# Patient Record
Sex: Female | Born: 1945 | Race: Black or African American | Hispanic: No | Marital: Married | Smoking: Never smoker
Health system: Southern US, Community
[De-identification: ages and names within clinical notes are randomized; demographics above are authoritative.]

## PROBLEM LIST (undated history)

## (undated) DIAGNOSIS — K069 Disorder of gingiva and edentulous alveolar ridge, unspecified: Secondary | ICD-10-CM

## (undated) DIAGNOSIS — I10 Essential (primary) hypertension: Secondary | ICD-10-CM

## (undated) DIAGNOSIS — F32A Depression, unspecified: Secondary | ICD-10-CM

## (undated) DIAGNOSIS — E663 Overweight: Secondary | ICD-10-CM

## (undated) DIAGNOSIS — K5900 Constipation, unspecified: Secondary | ICD-10-CM

## (undated) DIAGNOSIS — M25569 Pain in unspecified knee: Secondary | ICD-10-CM

## (undated) DIAGNOSIS — F419 Anxiety disorder, unspecified: Secondary | ICD-10-CM

## (undated) DIAGNOSIS — E785 Hyperlipidemia, unspecified: Secondary | ICD-10-CM

## (undated) DIAGNOSIS — G43909 Migraine, unspecified, not intractable, without status migrainosus: Secondary | ICD-10-CM

## (undated) DIAGNOSIS — Z8739 Personal history of other diseases of the musculoskeletal system and connective tissue: Secondary | ICD-10-CM

## (undated) DIAGNOSIS — M545 Low back pain, unspecified: Secondary | ICD-10-CM

## (undated) DIAGNOSIS — R0789 Other chest pain: Secondary | ICD-10-CM

## (undated) HISTORY — DX: Disorder of gingiva and edentulous alveolar ridge, unspecified: K06.9

## (undated) HISTORY — DX: Essential (primary) hypertension: I10

## (undated) HISTORY — DX: Personal history of other diseases of the musculoskeletal system and connective tissue: Z87.39

## (undated) HISTORY — DX: Depression, unspecified: F32.A

## (undated) HISTORY — PX: BREAST LUMPECTOMY: SHX2

## (undated) HISTORY — DX: Low back pain, unspecified: M54.50

## (undated) HISTORY — PX: TUBAL LIGATION: SHX77

## (undated) HISTORY — DX: Migraine, unspecified, not intractable, without status migrainosus: G43.909

## (undated) HISTORY — DX: Overweight: E66.3

## (undated) HISTORY — DX: Hyperlipidemia, unspecified: E78.5

## (undated) HISTORY — DX: Pain in unspecified knee: M25.569

## (undated) HISTORY — DX: Other chest pain: R07.89

## (undated) HISTORY — DX: Constipation, unspecified: K59.00

## (undated) HISTORY — DX: Anxiety disorder, unspecified: F41.9

---

## 1997-09-13 ENCOUNTER — Ambulatory Visit (HOSPITAL_COMMUNITY): Admission: RE | Admit: 1997-09-13 | Discharge: 1997-09-13 | Payer: Self-pay | Admitting: Internal Medicine

## 1998-06-02 ENCOUNTER — Ambulatory Visit (HOSPITAL_COMMUNITY): Admission: RE | Admit: 1998-06-02 | Discharge: 1998-06-02 | Payer: Self-pay | Admitting: Internal Medicine

## 1998-06-16 ENCOUNTER — Other Ambulatory Visit: Admission: RE | Admit: 1998-06-16 | Discharge: 1998-06-16 | Payer: Self-pay | Admitting: Obstetrics and Gynecology

## 1998-09-15 ENCOUNTER — Other Ambulatory Visit: Admission: RE | Admit: 1998-09-15 | Discharge: 1998-09-15 | Payer: Self-pay | Admitting: Obstetrics and Gynecology

## 1999-07-25 ENCOUNTER — Other Ambulatory Visit: Admission: RE | Admit: 1999-07-25 | Discharge: 1999-07-25 | Payer: Self-pay | Admitting: Obstetrics and Gynecology

## 1999-09-27 ENCOUNTER — Other Ambulatory Visit: Admission: RE | Admit: 1999-09-27 | Discharge: 1999-09-27 | Payer: Self-pay | Admitting: Obstetrics and Gynecology

## 1999-09-27 ENCOUNTER — Encounter (INDEPENDENT_AMBULATORY_CARE_PROVIDER_SITE_OTHER): Payer: Self-pay

## 1999-12-01 ENCOUNTER — Ambulatory Visit (HOSPITAL_COMMUNITY): Admission: RE | Admit: 1999-12-01 | Discharge: 1999-12-01 | Payer: Self-pay | Admitting: Obstetrics and Gynecology

## 1999-12-01 ENCOUNTER — Encounter (INDEPENDENT_AMBULATORY_CARE_PROVIDER_SITE_OTHER): Payer: Self-pay

## 2000-04-16 ENCOUNTER — Other Ambulatory Visit: Admission: RE | Admit: 2000-04-16 | Discharge: 2000-04-16 | Payer: Self-pay | Admitting: Obstetrics and Gynecology

## 2000-07-26 ENCOUNTER — Other Ambulatory Visit: Admission: RE | Admit: 2000-07-26 | Discharge: 2000-07-26 | Payer: Self-pay | Admitting: Obstetrics and Gynecology

## 2002-12-04 ENCOUNTER — Encounter: Payer: Self-pay | Admitting: Internal Medicine

## 2002-12-04 ENCOUNTER — Ambulatory Visit (HOSPITAL_COMMUNITY): Admission: RE | Admit: 2002-12-04 | Discharge: 2002-12-04 | Payer: Self-pay | Admitting: Internal Medicine

## 2003-03-05 ENCOUNTER — Encounter: Payer: Self-pay | Admitting: Cardiology

## 2003-03-05 ENCOUNTER — Ambulatory Visit (HOSPITAL_COMMUNITY): Admission: RE | Admit: 2003-03-05 | Discharge: 2003-03-05 | Payer: Self-pay | Admitting: Neurology

## 2003-03-11 ENCOUNTER — Other Ambulatory Visit: Admission: RE | Admit: 2003-03-11 | Discharge: 2003-03-11 | Payer: Self-pay | Admitting: Obstetrics and Gynecology

## 2003-07-22 ENCOUNTER — Emergency Department (HOSPITAL_COMMUNITY): Admission: EM | Admit: 2003-07-22 | Discharge: 2003-07-23 | Payer: Self-pay | Admitting: Emergency Medicine

## 2003-11-18 ENCOUNTER — Inpatient Hospital Stay (HOSPITAL_COMMUNITY): Admission: EM | Admit: 2003-11-18 | Discharge: 2003-11-22 | Payer: Self-pay | Admitting: *Deleted

## 2005-04-29 ENCOUNTER — Ambulatory Visit: Payer: Self-pay | Admitting: Cardiology

## 2005-04-29 ENCOUNTER — Observation Stay (HOSPITAL_COMMUNITY): Admission: EM | Admit: 2005-04-29 | Discharge: 2005-05-01 | Payer: Self-pay | Admitting: Emergency Medicine

## 2005-10-19 ENCOUNTER — Ambulatory Visit (HOSPITAL_COMMUNITY): Admission: RE | Admit: 2005-10-19 | Discharge: 2005-10-19 | Payer: Self-pay | Admitting: Gastroenterology

## 2008-08-15 ENCOUNTER — Emergency Department (HOSPITAL_COMMUNITY): Admission: EM | Admit: 2008-08-15 | Discharge: 2008-08-15 | Payer: Self-pay | Admitting: Emergency Medicine

## 2009-10-20 ENCOUNTER — Encounter: Admission: RE | Admit: 2009-10-20 | Discharge: 2009-10-20 | Payer: Self-pay | Admitting: Gastroenterology

## 2010-01-29 ENCOUNTER — Emergency Department (HOSPITAL_COMMUNITY): Admission: EM | Admit: 2010-01-29 | Discharge: 2010-01-30 | Payer: Self-pay | Admitting: Emergency Medicine

## 2010-07-11 LAB — CBC
MCHC: 34.4 g/dL (ref 30.0–36.0)
MCV: 86.6 fL (ref 78.0–100.0)
Platelets: 355 10*3/uL (ref 150–400)
RDW: 15.1 % (ref 11.5–15.5)
WBC: 12.7 10*3/uL — ABNORMAL HIGH (ref 4.0–10.5)

## 2010-07-11 LAB — POCT I-STAT, CHEM 8
BUN: 14 mg/dL (ref 6–23)
Chloride: 104 mEq/L (ref 96–112)
Glucose, Bld: 102 mg/dL — ABNORMAL HIGH (ref 70–99)
Potassium: 3.8 mEq/L (ref 3.5–5.1)
Sodium: 136 mEq/L (ref 135–145)

## 2010-07-11 LAB — DIFFERENTIAL
Basophils Absolute: 0 10*3/uL (ref 0.0–0.1)
Basophils Relative: 0 % (ref 0–1)
Eosinophils Relative: 0 % (ref 0–5)
Monocytes Absolute: 0.6 10*3/uL (ref 0.1–1.0)

## 2010-07-11 LAB — RAPID STREP SCREEN (MED CTR MEBANE ONLY): Streptococcus, Group A Screen (Direct): POSITIVE — AB

## 2010-07-11 LAB — URINALYSIS, ROUTINE W REFLEX MICROSCOPIC
Bilirubin Urine: NEGATIVE
Glucose, UA: NEGATIVE mg/dL
Protein, ur: NEGATIVE mg/dL
Specific Gravity, Urine: 1.023 (ref 1.005–1.030)
pH: 7.5 (ref 5.0–8.0)

## 2010-08-18 NOTE — Discharge Summary (Signed)
Sara Obrien, MCSHEA NO.:  [id number]   MEDICAL RECORD NO.:  [id number]          PATIENT TYPE:  INP   LOCATION:  3038                         FACILITY:  [facility]   PHYSICIAN:  Margaretmary Bayley, M.D.    DATE OF BIRTH:  [birth year]   DATE OF ADMISSION:  11/17/2003  DATE OF DISCHARGE:  11/22/2003                                 DISCHARGE SUMMARY   DISCHARGE DIAGNOSES:  1.  Vascular headache exacerbated by acute febrile illness.  2.  Hypovolemia.  3.  Nonspecific gastroenteritis with radiologic evidence suggesting colitis.  4.  Arteriosclerotic cerebrovascular disease.  5.  Hypothyroidism.  6.  Hypokalemia.   REASON FOR ADMISSION:  Sara Obrien is a 65 year old African-American woman who  is admitted with a severe periorbital and parietal headache, fever and  profuse weakness, and nausea, but without any vomiting.  The patient was  brought to the emergency room by her husband where she was evaluated by the  staff and found to have a temperature elevation of 101, and an associated  headache with nausea; however, without the usual visual changes that  accompany her migraines.   PHYSICAL EXAMINATION:  Pertinent physical findings on exam:  VITAL SIGNS:  The patient's temperature is elevated at 100.8 with a pulse  rate of 84, respiratory rate of 20 and unlabored, and blood pressure of  129/64.  HEENT:  Without any acute changes and no conjunctival pallor, and no sinus  tenderness.  NECK:  The neck is supple.  There is no adenopathy or thyromegaly.  CHEST:  The patient's chest is without any splitting or tenderness.  LUNGS:  The lungs are clear.  CARDIAC EXAMINATION:  The patient has a precordium that is adynamic.  S1 and  S2 are normal.  No pathologic murmurs or rubs are noted.  ABDOMEN:  The abdomen is nondistended.  There is a mild-to-moderate  epigastric and right-sided tenderness without any rebound.  Bowel sounds are  present, but diminished.  EXTREMITIES:  No  clubbing, cyanosis or edema.  No calf tenderness.  NEUROLOGIC:  The patient is alert and oriented, and appears anxious and  uncomfortable, but without any hallucinations and no changes in sensorium.  There are no focal sensory, motor or reflex deficits.   LABORATORY DATA:  Pertinent lab and x-ray data:  Chest x-ray revealed some  mild cardiomegaly.  There is no pulmonary or vascular abnormality.  Lungs  are clear.  A noncontrast CT scan was performed and showed no evidence of  intracranial hemorrhage, brain edema and no mass effect.   HOSPITAL COURSE:  The patient subsequently underwent a fluoroscopic guided  lumbar puncture because of her fever and headache.  This procedure revealed  clear cerebrospinal fluid and there was no active cellular pattern to  suggest infection; and, subsequent culture revealed no growth.  However, a  pertinent workup included a urinalysis that revealed a specific gravity of  1.029 and no significant cells; however, she did have some hyaline casts.  Blood cultures times two revealed no growth.  A throat culture was negative  for Strep.  Rheumatoid factor was less  than 20; negative.  ANA was likewise  negative.  Admission CBC indicated an elevated white count of 11,900 and the  repeat was 14,300 with a shift to the left, and a hematocrit of 37 and  35.  Sed rate was normal at 15.  A potassium was low at 2.7 on admission and a  sodium of 134, and glucose was slightly high at 107.  The remainder of her  metabolic panel was normal along with a T4 of 6.5 and a TSH of 0.5.  A  random cortisol level was 11 and her FSH of 55 was consistent with  postmenopausal state.  An aldosterone level was less than 1.6 and an  overnight Decadron suppressed cortisol level was appropriate at 2.8.  The  latter results were consistent with a nonendocrine basis for her hypokalemia  and felt to be most likely related to a state of secondary  hyperaldosteronism related to volume  depletion.   Because of her fever and her abdominal tenderness a CT scan was performed,  which indicated a marked inflammatory process involving the ascending colon  felt to be infectious or an inflammatory colitis.  The patient's normal sed  rate of 15 was felt to be inconsistent with inflammatory bowel disease.  The  appendix was normal as well as loops of small bowel.  She did have some  slightly distended prominent ovarian veins; however, there was no evidence  of thromboses; and, ovaries and uterus were normal along with her bladder.   The patient was empirically treated with parenteral Flagyl therapy for an  infectious colitis.  She received parenteral rehydration and pain management  with the use of Vicodin.  Over the course of her five-day hospital stay the  patient did quite well without any subsequent problems being identified.  It  was felt that she would need further GI evaluation as an outpatient and was  planned to have her referred for GI consultation regarding the need for  colonoscopy.  In addition, she was seen in consultation by the neurology  service for her headaches.  It was felt that the patient did have migraines  with an exacerbation by her acute febrile illness and no further workup was  felt necessary at this time.  She was seen by Dr. Vickey Huger previously for  her arteriosclerotic cerebrovascular disease and it was the plan to have her  follow up with the patient as an outpatient.   The patient was subsequently discharged home on Inderal LA 80 mg per day as  both treatment for systemic hypertension as well as migraine prophylaxis,  Plavix 75 mg per day, Flagyl 500 mg twice a day, and Cipro 500 b.i.d. for  one week.   The patient is scheduled to be seen back in my office in two weeks; and, she  needs to call for an appointment to see Dr. Vickey Huger within four weeks of  her discharge.   DISCHARGE CONDITION:  Discharge condition is significantly improved and  the prognosis is considered to be good.       PC/MEDQ  D:  12/30/2003  T:  12/31/2003  Job:  [id number]

## 2010-08-18 NOTE — Op Note (Signed)
[facility]  Patient:    Sara Obrien, Sara Obrien                      MRN: [id number] Proc. Date: 12/01/99 Adm. Date:  [id number] Attending:  Leonard Schwartz                           Operative Report  PREOPERATIVE DIAGNOSES:       1. Cervical intraepithelial neoplasia.                               2. Inadequate colposcopy, with the                                  transition cell not being completely                                  visualized.  POSTOPERATIVE DIAGNOSES:      1. Cervical intraepithelial neoplasia.                               2. Inadequate colposcopy, with the                                  transition cell not being completely                                  visualized.  PROCEDURE:                    LEAP electrical excision.  SURGEON:                      Janine Limbo, M.D.  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  She understands the indications for her procedure and she accepts the risks of, but not limited to, anesthetic complications, bleeding, infections, and possible damage to the surrounding organs.  FINDINGS:                     Colposcopic changes of white epithelium were                               present.  DESCRIPTION OF PROCEDURE:     The patient was taken to the operating room, where she was placed in the lithotomy position.  The vagina was prepped with acetic acid.  The cervix was injected with a diluted solution of Xylocaine and ______ and saline.  A paracervical block was placed using 10 cc of 1% Xylocaine.   Lugol solution was placed on the cervix, and there was only a very small nonstaining area at the endocervical  canal.  The electrical loop was then used to remove a cone-shaped portion of  the cervix.  Hemostasis was achieved with the Ball apparatus.  The estimated blood loss of the procedure was 5 cc.  The patient tolerated the procedure well.  She was taken to the recovery room in stable condition.  FOLLOW-UP INSTRUCTIONS:       The patient was given an instruction sheet, as prepared by the [facility] [place], for patients who have had a D&C, but then was modified for a LEAP procedure.  She will take ibuprofen as needed for discomfort.  She will return to the office in three weeks for follow-up examination. DD:  12/01/99 TD:  12/01/99 Job: [id number]

## 2010-08-18 NOTE — H&P (Signed)
Sara Obrien, Sara Obrien               ACCOUNT NO.:  [id number]   MEDICAL RECORD NO.:  [id number]          PATIENT TYPE:  OBV   LOCATION:  1826                         FACILITY:  [facility]   PHYSICIAN:  Marrian Salvage. Freida Busman, M.D. [facility] OF BIRTH:  [birth year]   DATE OF ADMISSION:  04/29/2005  DATE OF DISCHARGE:                                HISTORY & PHYSICAL   PRIMARY MEDICAL DOCTOR:  Dorothyann Peng in [place].   CARDIOLOGIST:  None previously.   ADMITTING PHYSICIAN:  Dr. Lewayne Bunting   CHIEF COMPLAINT:  Chest pain.   HISTORY OF PRESENT ILLNESS:  The patient is a 65 year old female with no  known prior heart disease who does have cardiac risk factors for  hypertension and cerebrovascular disease. She had some headaches and  dizziness in 2004 and underwent a head CT that showed some mild disease.   Over the last year the patient has had occasional tingling pains in the  chest and down the left arm associated with left-sided leg cramps and  tingling as well. Initially this was only a few times per month but it has  been progressive so that she now almost has it daily. She has been treating  with a heating pad over the left chest with some help. Usually the symptoms  last for about 30 minutes. The past 2 days, January 26 and 27, she has had  increasing leg pains that were significantly bothering her. However, her  chest discomfort had been about the same as previously.   Then today, on January 28 at about 4 p.m. while working on the computer, she  had sudden onset of severe left-sided chest pain, 7/10 in severity, and  radiating down the left arm. She had mild shortness of breath which she  described as needing to take a deep breath in. She had some mild nausea but  no emesis. She had no diaphoresis with the chest pain but she did notice  some diaphoresis in church at 11 a.m. when otherwise she had no chest pain.  She laid down and had worsening sharp pain in the chest to 8/10 or 9/10. She  put a heating pad on without effect. She told her husband that she needed  medical attention. He brought her to the emergency department at [facility].   In the ED the patient had T-wave inversions anteriorly on her EKG. Her  initial troponin was negative. However, a repeat troponin was 0.15. MB and  myoglobin were negative and stable. Given these findings, she was admitted  to the cardiology service. Of note, while in the emergency department being  admitted she was still having 2/10 to 3/10 chest pain but normal vital  signs.   PAST MEDICAL HISTORY:  1.  Mini strokes.  2.  Hypertension.  3.  Borderline hyperlipidemia.  4.  Endometriosis.  5.  Cervical neoplasia status post LEEP.  6.  Migraine headaches.   ALLERGIES:  No known drug allergies.   CURRENT MEDICATIONS:  1.  Plavix 75 mg daily.  2.  Lexapro 10 mg daily.  3.  Benicar 20 mg daily.  4.  Imitrex as needed.  5.  Tylenol P.M. as needed.   SOCIAL HISTORY:  The patient lives in [place] with her husband, Nadine Counts,  phone number [phone]. She is a retired Comptroller. She has two children who  live locally. She has never smoked, does not use alcohol, and denies any  history of drug use. She does water aerobics, Tai Chi, and some weight  lifting occasionally and remains quite active. She eats a regular diet. She  denies any herbal medication use.   FAMILY HISTORY:  Her mother is alive at age 57 but has had stroke,  hypertension, and diabetes. Father died at age 42 of congestive heart  failure after having an MI at age 46. Her siblings have had diabetes and  hypertension.   REVIEW OF SYSTEMS:  Positive for chest pain, shortness of breath, nausea,  and diaphoresis. Otherwise, negative for fevers, chills, recent headaches,  rash, syncope, orthopnea, edema, palpitations, cough, dysuria, arthralgias,  diarrhea. All other systems were negative in detail.   ADVANCE DIRECTIVES:  The patient has no Living Will. She is Full Code.    PHYSICAL EXAMINATION:  VITAL SIGNS:  Temperature 98.5, pulse 70,  respirations 20, blood pressure initially 134/69 and then repeated at  115/65. Her weight is 180 pounds. She was 97% on 2 L.  GENERAL:  She is in no apparent distress, comfortable, conversant.  HEENT:  She does have upper and lower dentures but her oral mucosa was  normal. She had no thyromegaly. No carotid bruits. JVP was flat.  HEART:  Regular rate and rhythm with normal S1, S2. No S3 or S4. No murmurs  or rubs or gallops.  LUNGS:  Clear to auscultation bilaterally with good air movement.  SKIN:  No rashes.  ABDOMEN:  Soft, nontender, with no hepatomegaly.  MUSCULOSKELETAL:  Her chest wall was nontender and her left shoulder had  normal range of motion without reproducibility of symptoms. Her extremities  showed no edema and were warm with 2+ pulses in the PTs.  NEUROLOGIC:  She was alert and oriented and answered questions  appropriately.   Chest x-ray was reviewed and was negative. EKG showed normal sinus rhythm  with one PAC at a rate of 72 with normal axis, normal intervals. She had a Q  wave in L but otherwise no significant Q waves. Her T waves were inverted  anteriorly and apically. She had T-wave flattening inferiorly. There was  some J-point elevation which appeared to be repolarization in lead V2, V3.  No hypertrophy. There was no comparison EKG available.   LABORATORY:  Hematocrit 42. Sodium 136, potassium 4.1, BUN 17, glucose 100.  Myoglobin was initially 86, repeated at 81. Her MB was 3 and was repeated at  3. Troponin was initially negative but then repeated at 0.15.   IMPRESSION:  A 65 year old female with positive cardiac risk factors and  atypical chest pain who presents with equivocal EKG and a borderline  troponin elevation.   1.  Chest pain. A decent story for unstable angina or acute coronary     syndrome starting at 4 p.m. I am not sure what to make of her leg pains      and her potassium is  normal. Her EKG is equivocal without obvious ST      elevations. Unfortunately, there is no comparison. Her cardiac markers      are largely negative but her mild increase in troponin from 6:40 p.m. to      7:40  p.m. is somewhat concerning. Consequently, she is being admitted      for rule out myocardial infarction and is being placed on telemetry. She      will get serial cardiac markers as well as an EKG in the morning. We      will continue her Plavix and add aspirin. She will stay on ARB. Start      low-dose beta blocker and statin therapy. She should get fasting lipids      in the morning. I have put her on heparin. Given her continued mild      chest pain, she will be on nitroglycerin drip at low dose. If her      troponin and MB continue to climb, then she should be started on      IIb/IIIa and get cardiac catheterization tomorrow. If the troponin is      negative at main laboratory, then I would consider exercise stress      testing with imaging given her abnormal EKG. I will defer to the a.m.      rounder on this final decision.  2.  Headache. No further Imitrex if she does have CAD.  3.  Prophylaxis. Proton pump inhibitor and heparin.  4.  Observation admission for now. If stress or catheterization is negative      she could likely go home tomorrow afternoon.           ______________________________  Marrian Salvage Freida Busman, M.D. [facility]     LAA/MEDQ  D:  04/29/2005  T:  04/30/2005  Job:  [id number]   cc:   Candyce Churn. Allyne Gee, M.D.  Fax: [phone]

## 2010-08-18 NOTE — Discharge Summary (Signed)
NAMESEREEN, SCHAFF               ACCOUNT NO.:  [id number]   MEDICAL RECORD NO.:  [id number]          PATIENT TYPE:  OBV   LOCATION:  5504                         FACILITY:  [facility]   PHYSICIAN:  Salvadore Farber, M.D. LHCDATE OF BIRTH:  12/26/1945   DATE OF ADMISSION:  04/29/2005  DATE OF DISCHARGE:  05/01/2005                                 DISCHARGE SUMMARY   PRIMARY CARE PHYSICIAN:  Robyn N. Allyne Gee, M.D., [place], [place].   PRIMARY CARDIOLOGIST:  Salvadore Farber, M.D. [facility]   PRINCIPAL DIAGNOSIS:  Chest pain.   OTHER DIAGNOSES:  1.  Hypertension.  2.  Hyperlipidemia  3.  History of migraine headaches.  4.  History of mini strokes.  5.  Endometriosis.  6.  Cervical neoplasia, status post LEEP.   ALLERGIES:  No known drug allergies.   PROCEDURE:  Left heart cardiac catheterization.   HISTORY OF PRESENT ILLNESS:  A 65 year old female with no prior history of  CAD, who was in her usual state of health until April 29, 2005, when she  was sitting at her computer and developed sudden onset of left-sided 7/10  chest pain with radiation down the left arm associated with mild shortness  of breath and nausea. The pain was worsened with lying down and prompted her  to come to the [facility] ED. In the emergency room, EKG revealed anterior T-  wave inversions. Her initial troponin was negative, however subsequent  troponin was 0.15 with negative CK-MB. Decision was made to admit her for  further evaluation.   HOSPITAL COURSE:  The patient had no additional chest pain during  hospitalization and underwent left heart cardiac catheterization on January  29th which revealed normal coronary arteries and normal LV function. There  was no evidence of coronary vasospasm. D-dimer was negative. This morning  she has been ambulating without recurrent discomfort and is being discharged  home today in satisfactory condition.   DISCHARGE LABORATORY:  Hemoglobin 11.9, hematocrit  34.1, white blood cell  count 4.7, platelet count 370,000. D-dimer was negative times two. Sodium  140, potassium 4.1, chloride 107, CO2 27, BUN 11, creatinine 0.8, glucose  95, calcium 8.9.  Peak troponin was 0.15 with negative CKs and MBs.  Total  cholesterol was 203, triglycerides 76, HDL 51, LDL 137. TSH 1.613.   DISPOSITION:  The patient is being discharged home today in good condition.   FOLLOWUP PLANS AND APPOINTMENTS:  She is to follow up with her primary care  physician, Dr. Dorothyann Peng, in three to four weeks, and has follow-up with  Dr. Samule Ohm on February 21st at 9 a.m. at the [facility] in  [place].   DISCHARGE MEDICATIONS:  1.  Aspirin 81 mg daily.  2.  Benicar 20 mg daily.  3.  Lexapro 10 mg daily.  4.  Lipitor 10 mg daily.   OUTSTANDING LABORATORY STUDIES:  None.   DURATION OF DISCHARGE ENCOUNTER:  30 minutes including physician time.      Ok Anis, NP      Salvadore Farber, M.D. [facility]  Electronically Signed  CRB/MEDQ  D:  05/01/2005  T:  05/01/2005  Job:  [id number]

## 2010-08-18 NOTE — Cardiovascular Report (Signed)
Sara Obrien, Sara Obrien               ACCOUNT NO.:  [id number]   MEDICAL RECORD NO.:  [id number]          PATIENT TYPE:  OBV   LOCATION:  5504                         FACILITY:  [facility]   PHYSICIAN:  Arturo Morton. Riley Kill, M.D. [facility] OF BIRTH:  1945-05-09   DATE OF PROCEDURE:  04/30/2005  DATE OF DISCHARGE:                              CARDIAC CATHETERIZATION   .   INDICATIONS:  The patient presented with chest pain.  She has an abnormal  EKG.  One cardiac enzyme was abnormal but the remaining have been normal.  Her D-dimer is normal as well.  She was seen by Dr. Samule Ohm who subsequently  recommended cardiac catheterization.  He explained the risks, benefits and  alternatives to her and she was brought to the laboratory for further  evaluation.   PROCEDURE:  1.  Left heart catheterization.  2.  Selective coronary arteriography.  3.  Selective left ventriculography.   DESCRIPTION OF PROCEDURE:  Following informed consent the patient was  brought to the catheterization laboratory in the post absorptive state and  prepped and draped in the usual fashion.  Through an anterior puncture the  right femoral artery was entered and a 6-French sheath was placed.  Views of  the left and right coronaries were obtained in multiple angiographic  projections.  Central aortic and left ventricular pressures were measured  with a pigtail.  Ventriculography was performed in the RAO projection.  I  then carefully reviewed all the angiographic studies.  She was taken to the  holding area in satisfactory clinical condition without complications.   HEMODYNAMIC DATA:  1.  Central aortic pressure 147/80, mean 107.  2  Left ventricular pressure 145/60.  1.  No gradient pullback across aortic valve.   ANGIOGRAPHIC DATA:  1.  Ventriculography was performed in the RAO projection.  Overall systolic      function appears preserved.  There were extra-systoles associated with      injections so was difficult to  calculate ejection fraction.  2.  The right coronary artery is a somewhat tortuous vessel but appears to      be smooth throughout its entire course.  It provides a posterior      descending branch of small to moderate size in the posterolateral      branches.  The inferior branches are small in caliber, but did do not      appear to obtain highly obstructive disease and the proximal portions of      the vessels appears smooth throughout.  3.  The left main coronary is fairly short.  It is free of critical disease.  4.  Left anterior descending artery is a tortuous vessel that wraps the apex      and provides the distal inferior wall with a fairly large caliber      branch.  The LAD has marked tortuosity after the septal perforator, but      the vessel is laid out in multiple views and appears to be free of      obstructive disease.  5.  The circumflex system provides a moderate  size first marginal branch      that bifurcates and appears to be free of critical disease, the      remaining vessel courses posteriorly and provides a somewhat smaller      second major marginal and that also appears free of critical disease.   CONCLUSION:  1.  Somewhat tortuous coronaries but without high-grade obstructive disease      noted.  2.  Preserved overall LV function.   The patient's coronary anatomy appears strikingly unremarkable despite the  abnormal EKG.  She has had somewhat atypical symptoms.  D-dimer has been  normal at less than 0.22 but we will repeat this.  We will repeat her EKG in  the morning.  Whether she could have coronary spasm is another possibility.  I will discuss this with Dr. Samule Ohm.      Arturo Morton. Riley Kill, M.D. [facility]  Electronically Signed     TDS/MEDQ  D:  04/30/2005  T:  05/01/2005  Job:  [id number]

## 2010-08-18 NOTE — Consult Note (Signed)
NAME:  Sara Obrien, Sara Obrien                         ACCOUNT NO.:  [id number]   MEDICAL RECORD NO.:  [id number]                   PATIENT TYPE:  INP   LOCATION:  3038                                 FACILITY:  [facility]   PHYSICIAN:  Pramod P. Pearlean Brownie, MD                 DATE OF BIRTH:  1945-05-17   DATE OF CONSULTATION:  11/18/2003  DATE OF DISCHARGE:                                   CONSULTATION   REASON FOR CONSULTATION:  Headache.   HISTORY OF PRESENT ILLNESS:  Sara Obrien is a 65 year old African American  lady who has been having intermittent headaches since her teenage years.  These headaches have increased in frequency since April.  She describes an  ____________ with bifrontal throbbing headache with nausea, photophobia,  phonophobia with a headache for the last several hours left untreated.  She  had to stop and lie down with the headache.  Headaches have recently been  occurring about twice a week.  She takes Tylenol extra strength which takes  the edge off but does not completely get rid of the headache.  She last had  a headache on Tuesday when she began with the headache and she took some  Tylenol.  Subsequently, a few hours later she started having fever with  chills as well as back pain and leg pain.  Her symptoms persisted and she  came to the emergency room late last night and she was also found to have  some diarrhea.   She had a spinal tap done which was unremarkable.  At present, she has had  fevers with chills, back pain, and leg pain but not complaining of  significant headache.   Her past neurologic history is significant for episode of some transient  left arm numbness with slurred speech last year for which she was seen by  Dr. Porfirio Mylar Dohmeier in the office in November 2004.  MRI scan of the brain  had revealed no significant abnormalities in response with her changes.  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  The patient had no definite other history of  strokes or similar events besides this episode.   PAST MEDICAL HISTORY:  1. Significant for hypertension.  2. Hypothyroidism.  3. Endometriosis.  4. __________.   MEDICATIONS:  Aspirin, Plavix, and extra strength Tylenol p.r.n.   ALLERGIES:  None.   FAMILY HISTORY:  Significant for stroke in mother.   SOCIAL HISTORY:  She is married and lives with her husband.  She works as a  Comptroller.  She quit smoking 30 years ago.  She does not drink alcohol or do  drugs.  She has two eldest children.   REVIEW OF SYMPTOMS:  Significant for recent headaches, fever, back pain, leg  pain as stated earlier.   PHYSICAL EXAMINATION:  GENERAL:  Pleasant, middle-aged who is fairly in  distress with fever and chills.  VITAL SIGNS:  Temperature maximum today was 103.9 and 100.1 at present.  Blood pressure 129/64, respiratory rate 24, heart rate 79.  Saturation 97%  on room air.  HEENT:  Normocephalic and atraumatic.  NECK:  Supple without any stiffness or bruit.  HEENT:  Unremarkable.  CARDIOVASCULAR:  No murmur or gallop.  LUNGS:  Clear to auscultation.  NEUROLOGICAL:  She is awake, alert, and oriented x 3 with normal speech and  language function.  There is no aphasia.  Pupils are equal, round, and  reactive to light and accommodation. Visual acuity is good.  Face is  symmetric bilaterally.  Tongue is midline.  Motor system exam reveals  symmetric upper and lower extremities to tone, reflexes, coordination, and  sensation.  Plantars are downgoing.  Gait was not tested.   LABORATORY DATA:  Data reviewed.  Noncontrast CAT scan of the head is not  remarkable.  Spinal fluid showed normal protein and glucose and negative  Gram-stain.  WBC count is 3, red cell count is 27.  White count is elevated  at 14.3 with increased absolute neutrophil count.  ESR is 15 mm.  Blood  cultures and urine cultures are pending.   IMPRESSION:  This is a  65 year old lady with headache in the setting of  acute febrile illness.  She does have a history of migraine headaches and  may had a recent exacerbation due to the acute febrile illness.  I doubt she  has new primary neurological illness.   PLAN:  Continue symptomatic treatment for headaches with Tylenol as needed.  Evaluation for acute febrile illness as per you.  I do not believe further  diagnostic or neurological testing is indicated at the present time.  We  will follow the patient on consult.                                               Pramod P. Pearlean Brownie, MD    PPS/MEDQ  D:  11/18/2003  T:  11/18/2003  Job:  [id number]

## 2010-08-18 NOTE — H&P (Signed)
[facility]  Patient:    Sara Obrien, Sara Obrien                        MRN: [id number] Adm. Date:  12/01/99 Attending:  Janine Limbo, M.D.                         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  The transition zone could not be adequately seen.  PAST MEDICAL HISTORY:         The patient has a history of hypothyroidism.  MEDICATIONS:                  She is currently taking Synthroid 0.112 mg each day.  She is taking Estratab and Provera for menopausal symptoms.  DRUG ALLERGIES:               None.  SOCIAL HISTORY:               The patient denies cigarette use, alcohol use, and recreational drug use.  REVIEW OF SYSTEMS:            Noncontributory.  FAMILY HISTORY:               Noncontributory.  PHYSICAL EXAMINATION:  VITAL SIGNS:                  Weight is 189 pounds.  HEENT:                        Within normal limits.  CHEST:                        Clear.  HEART:                        Regular rate and rhythm.  BREASTS:                      Without masses.  ABDOMEN:                      Nontender.  EXTREMITIES:                  Within normal limits.  NEUROLOGIC EXAMINATION:       Normal.  PELVIC EXAMINATION:           External genitalia is normal.  The vagina is normal.  Cervix is normal except for colposcopic findings.  The uterus is normal size, shape, and consistency.  Adnexa with no masses.  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  PLAN:                         The patient will have a LEEP procedure performed.  Risks and benefits were reviewed. DD:  11/30/99 TD:  11/30/99 Job: [id number]

## 2010-08-20 ENCOUNTER — Emergency Department (HOSPITAL_COMMUNITY)
Admission: EM | Admit: 2010-08-20 | Discharge: 2010-08-20 | Disposition: A | Discharge status: Home / Self Care | Payer: 59 | Attending: Emergency Medicine | Admitting: Emergency Medicine

## 2010-08-20 DIAGNOSIS — R070 Pain in throat: Secondary | ICD-10-CM | POA: Insufficient documentation

## 2010-08-20 DIAGNOSIS — I10 Essential (primary) hypertension: Secondary | ICD-10-CM | POA: Insufficient documentation

## 2010-08-20 DIAGNOSIS — F3289 Other specified depressive episodes: Secondary | ICD-10-CM | POA: Insufficient documentation

## 2010-08-20 DIAGNOSIS — R509 Fever, unspecified: Secondary | ICD-10-CM | POA: Insufficient documentation

## 2010-08-20 DIAGNOSIS — Z79899 Other long term (current) drug therapy: Secondary | ICD-10-CM | POA: Insufficient documentation

## 2010-08-20 DIAGNOSIS — IMO0001 Reserved for inherently not codable concepts without codable children: Secondary | ICD-10-CM | POA: Insufficient documentation

## 2010-08-20 DIAGNOSIS — B9789 Other viral agents as the cause of diseases classified elsewhere: Secondary | ICD-10-CM | POA: Insufficient documentation

## 2010-08-20 DIAGNOSIS — F329 Major depressive disorder, single episode, unspecified: Secondary | ICD-10-CM | POA: Insufficient documentation

## 2010-08-20 LAB — URINALYSIS, ROUTINE W REFLEX MICROSCOPIC
Bilirubin Urine: NEGATIVE
Glucose, UA: NEGATIVE mg/dL
Ketones, ur: NEGATIVE mg/dL
Protein, ur: NEGATIVE mg/dL
pH: 8 (ref 5.0–8.0)

## 2010-08-20 LAB — RAPID STREP SCREEN (MED CTR MEBANE ONLY): Streptococcus, Group A Screen (Direct): NEGATIVE

## 2010-09-26 ENCOUNTER — Ambulatory Visit (INDEPENDENT_AMBULATORY_CARE_PROVIDER_SITE_OTHER): Payer: 59

## 2010-09-26 ENCOUNTER — Inpatient Hospital Stay (INDEPENDENT_AMBULATORY_CARE_PROVIDER_SITE_OTHER)
Admission: RE | Admit: 2010-09-26 | Discharge: 2010-09-26 | Disposition: A | Discharge status: Home / Self Care | Payer: 59 | Source: Ambulatory Visit | Attending: Emergency Medicine | Admitting: Emergency Medicine

## 2010-09-26 DIAGNOSIS — S61209A Unspecified open wound of unspecified finger without damage to nail, initial encounter: Secondary | ICD-10-CM

## 2010-10-10 ENCOUNTER — Other Ambulatory Visit: Payer: Self-pay | Admitting: Radiology

## 2010-10-11 ENCOUNTER — Inpatient Hospital Stay (HOSPITAL_COMMUNITY): Admission: RE | Admit: 2010-10-11 | Payer: 59 | Source: Ambulatory Visit

## 2010-10-11 ENCOUNTER — Inpatient Hospital Stay (HOSPITAL_COMMUNITY)
Admission: RE | Admit: 2010-10-11 | Discharge: 2010-10-11 | Disposition: A | Discharge status: Home / Self Care | Payer: 59 | Source: Ambulatory Visit | Attending: Family Medicine | Admitting: Family Medicine

## 2010-10-24 ENCOUNTER — Encounter (INDEPENDENT_AMBULATORY_CARE_PROVIDER_SITE_OTHER): Payer: Self-pay | Admitting: General Surgery

## 2010-10-24 ENCOUNTER — Ambulatory Visit (INDEPENDENT_AMBULATORY_CARE_PROVIDER_SITE_OTHER): Payer: 59 | Admitting: General Surgery

## 2010-10-24 VITALS — BP 124/88 | HR 64 | Temp 96.7°F | Ht 61.0 in | Wt 175.8 lb

## 2010-10-24 DIAGNOSIS — N6310 Unspecified lump in the right breast, unspecified quadrant: Secondary | ICD-10-CM

## 2010-10-24 DIAGNOSIS — N63 Unspecified lump in unspecified breast: Secondary | ICD-10-CM

## 2010-10-24 NOTE — Patient Instructions (Signed)
We will schedule you for excision of your right breast mass with needle localization in the near future.

## 2010-10-24 NOTE — Progress Notes (Signed)
Subjective:     Patient ID: Sara Obrien, female   DOB: 07-31-1945, 65 y.o.   MRN: [id number]  HPI This is a 65 year old African American female, referred to me by Dr. Jeralyn Ruths for consideration of right breast biopsy. Dr. Gwenette Greet is her regular physician.  The patient has no breast symptoms. No pain no nipple discharge no mass. Recent mammograms suggested a mass on the left side. Followup mammograms and ultrasound showed a small cyst in the left breast at 1:00 position, but there was a solid density in the right breast at the 11:00 position in the retroareolar area. This measured 4 mm in diameter. Image guided right breast biopsy of the retroareolar mass was performed on July 10. The pathology reveals complex sclerosing lesion with ductal hyperplasia and Calcifications. The patient was referred for consideration of complete excision to rule out occult malignancy. The patient would like to have this done.  It is notable that both of her sisters had breast cancer, one died in 09-07-08 and one had a double mastectomy.  Past Medical History  Diagnosis Date  . Hypertension   . Migraines   . Gum disease     dentures worn    Current Outpatient Prescriptions  Medication Sig Dispense Refill  . citalopram (CELEXA) 10 MG tablet Take 10 mg by mouth daily.        [name] olmesartan-hydrochlorothiazide (BENICAR HCT) 40-12.5 MG per tablet Take 1 tablet by mouth daily.        . SUMAtriptan (IMITREX) 25 MG tablet Take 25 mg by mouth as needed.          No Known Allergies  History reviewed. No pertinent family history.  History  Substance Use Topics  . Smoking status: Never Smoker   . Smokeless tobacco: Not on file  . Alcohol Use: Yes     glass of wine      Review of Systems 13 system review of systems is performed and is negative except as described above.    Objective:   Physical Exam  Constitutional: She is oriented to person, place, and time. She appears well-nourished. No  distress.  HENT:  Head: Normocephalic and atraumatic.  Mouth/Throat: No oropharyngeal exudate.  Eyes: Conjunctivae are normal. Pupils are equal, round, and reactive to light. Left eye exhibits no discharge. No scleral icterus.  Neck: Normal range of motion. Neck supple. No JVD present. No tracheal deviation present. No thyromegaly present.  Cardiovascular: Normal rate and regular rhythm.  Exam reveals no gallop and no friction rub.   No murmur heard. Pulmonary/Chest: Breath sounds normal. No respiratory distress. She has no wheezes. She has no rales.    Abdominal: Soft. Bowel sounds are normal. She exhibits no distension and no mass. There is no tenderness. There is no rebound and no guarding.  Musculoskeletal: She exhibits no edema and no tenderness.  Lymphadenopathy:    She has no cervical adenopathy.  Neurological: She is alert and oriented to person, place, and time. She exhibits normal muscle tone.  Skin: Skin is warm and dry. No rash noted. She is not diaphoretic. No erythema. No pallor.  Psychiatric: She has a normal mood and affect. Her behavior is normal. Judgment and thought content normal.       Assessment:     Complex sclerosing lesion with calcifications, right breast, 11:00 retroareolar location.    Plan:     Will schedule for right breast biopsy with needle localization in the near future.  I discussed the  indications and details of surgery with the patient. Risks and complications have been discussed, including but not limited to cosmetic deformity, repeat surgery if malignancy is found, cardiac pulmonary and thromboembolic problems. She  understands these issues well. At this time all of her questions are answered. She is agreement with this plan. We will schedule the near future.

## 2010-11-06 ENCOUNTER — Encounter (HOSPITAL_BASED_OUTPATIENT_CLINIC_OR_DEPARTMENT_OTHER)
Admission: RE | Admit: 2010-11-06 | Discharge: 2010-11-06 | Disposition: A | Discharge status: Home / Self Care | Payer: 59 | Source: Ambulatory Visit | Attending: General Surgery | Admitting: General Surgery

## 2010-11-06 LAB — BASIC METABOLIC PANEL
BUN: 15 mg/dL (ref 6–23)
CO2: 25 mEq/L (ref 19–32)
Creatinine, Ser: 0.69 mg/dL (ref 0.50–1.10)
GFR calc Af Amer: >60 mL/min (ref >60)
GFR calc non Af Amer: >60 mL/min (ref >60)
Glucose, Bld: 88 mg/dL (ref 70–99)
Sodium: 136 mEq/L (ref 135–145)

## 2010-11-06 LAB — URINALYSIS, ROUTINE W REFLEX MICROSCOPIC
Bilirubin Urine: NEGATIVE
Leukocytes, UA: NEGATIVE
pH: 5.5 (ref 5.0–8.0)

## 2010-11-08 ENCOUNTER — Ambulatory Visit (HOSPITAL_BASED_OUTPATIENT_CLINIC_OR_DEPARTMENT_OTHER)
Admission: RE | Admit: 2010-11-08 | Discharge: 2010-11-08 | Disposition: A | Discharge status: Home / Self Care | Payer: 59 | Source: Ambulatory Visit | Attending: General Surgery | Admitting: General Surgery

## 2010-11-08 ENCOUNTER — Other Ambulatory Visit (INDEPENDENT_AMBULATORY_CARE_PROVIDER_SITE_OTHER): Payer: Self-pay | Admitting: General Surgery

## 2010-11-08 DIAGNOSIS — N6019 Diffuse cystic mastopathy of unspecified breast: Secondary | ICD-10-CM

## 2010-11-08 DIAGNOSIS — N63 Unspecified lump in unspecified breast: Secondary | ICD-10-CM | POA: Insufficient documentation

## 2010-11-08 DIAGNOSIS — Z01812 Encounter for preprocedural laboratory examination: Secondary | ICD-10-CM | POA: Insufficient documentation

## 2010-11-08 DIAGNOSIS — Z01810 Encounter for preprocedural cardiovascular examination: Secondary | ICD-10-CM | POA: Insufficient documentation

## 2010-11-08 DIAGNOSIS — Z803 Family history of malignant neoplasm of breast: Secondary | ICD-10-CM | POA: Insufficient documentation

## 2010-11-08 LAB — POCT I-STAT, CHEM 8
BUN: 10 mg/dL (ref 6–23)
Calcium, Ion: 1.19 mmol/L (ref 1.12–1.32)
Chloride: 101 mEq/L (ref 96–112)
Glucose, Bld: 99 mg/dL (ref 70–99)

## 2010-11-09 ENCOUNTER — Telehealth (INDEPENDENT_AMBULATORY_CARE_PROVIDER_SITE_OTHER): Payer: Self-pay

## 2010-11-09 NOTE — Telephone Encounter (Signed)
Unable to reach pt at phone # given. Lmom with appt for 11-20-10 and to call with concerns.

## 2010-11-09 NOTE — Telephone Encounter (Signed)
Pt notified of path result = benign no cancer. Pt has f/u ov.

## 2010-11-14 NOTE — Op Note (Signed)
Sara Obrien, Sara Obrien               ACCOUNT NO.:  [id number]  MEDICAL RECORD NO.:  [id number]  LOCATION:                                 FACILITY:  PHYSICIAN:  Angelia Mould. Derrell Lolling, M.D.DATE OF BIRTH:  1946/01/12  DATE OF PROCEDURE:  11/08/2010 DATE OF DISCHARGE:                              OPERATIVE REPORT   PREOPERATIVE DIAGNOSIS:  Right breast mass, subareolar, complex sclerosing lesion, rule out occult carcinoma.  POSTOPERATIVE DIAGNOSIS:  Right breast mass, subareolar, complex sclerosing lesion, rule out occult carcinoma.  OPERATION PERFORMED:  Right partial mastectomy with needle localization.  SURGEON:  Angelia Mould. Derrell Lolling, MD  OPERATIVE INDICATIONS:  This is a 65 year old African American female who has a strong family history of breast cancer in both of her sisters. She does not have any breast symptoms.  Her  recent mammogram suggested a mass in the right breast at the 11 o'clock position in the retroareolar area, measuring 4 mm in diameter.  Image-guided biopsy this area showed a complex sclerosing lesion with ductal hyperplasia and calcifications.  She was referred for consideration of complete excision to rule out occult malignancy.  I have examined her and counseled her as an outpatient.  I do not really feel any mass or adenopathy but because of the strong family history and the histology of the image-guided biopsy, I felt that it was appropriate to offer excision of this area. She very strongly wanted to have this done.  OPERATIVE TECHNIQUE:  The patient underwent wire localization at [facility] this morning.  The wire entered from about the 4 o'clock position of the right areola and was directed laterally and slightly superiorly toward the area of prior biopsy.  The mammograms that were done after the wire was placed showed that the wire went very near the marker clip and slightly passed it.  The patient was brought to the operating room at [facility].  General anesthesia was induced.  The right breast was prepped and draped in a sterile fashion. Intravenous antibiotics were given.  The patient was identified as correct patient, correct procedure, and correct site.  A 1% Xylocaine with epinephrine was used as a local infiltration anesthetic.  Because the wire entered medially, I chose to make a circumareolar incision at the areolar margin medially.  Dissection was carried down into the breast tissue and then with lots of traction and counter traction, I dissected the nipple and subcutaneous tissue off the breast tissue.  We dissected around the wire, removing most of the subareolar tissue including the wire and its tip intact.  The specimen mammogram showed the entire wire to be intact with the marker clip in reasonably centered in the specimen.  The specimen mammogram was discussed with the radiologist and they felt that everything looked good.  The breast tissue was then sent to Pathology.  It should be noted that I did mark the breast tissue with the 6-color margin marker kit to orient the pathologist.  The wound was irrigated with saline.  Hemostasis was excellent and achieved electrocautery.  The breast tissues were reapproximated with interrupted sutures of 3-0 Vicryl.  The skin was closed with a  running subcuticular suture of 4-0 Monocryl and Dermabond.  A clean bandage and breast binder were placed.  The patient tolerated the procedure well and was taken to recovery room in stable condition.  Estimated blood loss was about 10 mL.  COMPLICATIONS:  None.  Sponge, needle, and instrument counts were correct.     Angelia Mould. Derrell Lolling, M.D.     HMI/MEDQ  D:  11/08/2010  T:  11/09/2010  Job:  [id number]  cc:   Jeralyn Ruths, MD Candyce Churn. Allyne Gee, M.D.  Electronically Signed by Claud Kelp M.D. on 11/14/2010 09:27:37 AM

## 2010-11-20 ENCOUNTER — Ambulatory Visit (INDEPENDENT_AMBULATORY_CARE_PROVIDER_SITE_OTHER): Payer: Medicare Other | Admitting: General Surgery

## 2010-11-20 ENCOUNTER — Encounter (INDEPENDENT_AMBULATORY_CARE_PROVIDER_SITE_OTHER): Payer: Self-pay | Admitting: General Surgery

## 2010-11-20 VITALS — BP 128/82 | HR 66 | Temp 96.5°F

## 2010-11-20 DIAGNOSIS — D249 Benign neoplasm of unspecified breast: Secondary | ICD-10-CM

## 2010-11-20 NOTE — Patient Instructions (Signed)
Your breast biopsy shows benign fibrocystic disease and benign adenomatoid changes. There is no evidence for cancer. Your wounds appear to be healing well. I would avoid contact sports for another 3-4 weeks, otherwise normal activities.  I advise an annual breast exam with your primary care physician. Continu to get annual mammograms. Return to see me if any new problems arise.

## 2010-11-20 NOTE — Progress Notes (Signed)
Subjective:     Patient ID: Sara Obrien, female   DOB: [birth year], 66 y.o.   MRN: [id number]  HPI The 65 year old african American woman underwent a right partial mastectomy in the subareolar area on November 08, 2010. The final pathology report shows benign fibrocystic in adenomatoid changes. No malignancy identified.  She is comfortable with minimal pain seems to be healing well from her point of view. I discussed her pathology report with her and gave her a copy.  Review of Systems     Objective:   Physical Exam Patient looks well. The right periareolar incision is healing very nicely. There's no sign of hematoma or fluid collection or infection. A little bit of  flattening of the areolar area. The skin was healthy.   Assessment:     Right breast mass, subareolar area. Benign fibrocystic and adenomatoid changes by excisional biopsy.  Healing uneventfully in the are postop period.    Plan:     I discussed her pathology report with her and gave her a copy.  She was reassured.  Advise annual breast exam and annual mammography in the future.  Return to see me p.r.n.

## 2010-11-22 ENCOUNTER — Encounter (INDEPENDENT_AMBULATORY_CARE_PROVIDER_SITE_OTHER): Payer: Self-pay | Admitting: General Surgery

## 2012-02-13 ENCOUNTER — Other Ambulatory Visit: Payer: Self-pay | Admitting: Neurology

## 2012-02-13 DIAGNOSIS — R2689 Other abnormalities of gait and mobility: Secondary | ICD-10-CM

## 2012-02-18 ENCOUNTER — Ambulatory Visit
Admission: RE | Admit: 2012-02-18 | Discharge: 2012-02-18 | Disposition: A | Discharge status: Home / Self Care | Payer: Medicare Other | Source: Ambulatory Visit | Attending: Neurology | Admitting: Neurology

## 2012-02-18 DIAGNOSIS — R2689 Other abnormalities of gait and mobility: Secondary | ICD-10-CM

## 2012-03-28 ENCOUNTER — Other Ambulatory Visit: Payer: Self-pay | Admitting: Neurology

## 2012-03-28 DIAGNOSIS — R930 Abnormal findings on diagnostic imaging of skull and head, not elsewhere classified: Secondary | ICD-10-CM

## 2012-03-28 DIAGNOSIS — I6782 Cerebral ischemia: Secondary | ICD-10-CM

## 2012-04-01 ENCOUNTER — Ambulatory Visit
Admission: RE | Admit: 2012-04-01 | Discharge: 2012-04-01 | Disposition: A | Discharge status: Home / Self Care | Payer: Medicare Other | Source: Ambulatory Visit | Attending: Neurology | Admitting: Neurology

## 2012-04-01 DIAGNOSIS — R930 Abnormal findings on diagnostic imaging of skull and head, not elsewhere classified: Secondary | ICD-10-CM

## 2012-04-29 DIAGNOSIS — R413 Other amnesia: Secondary | ICD-10-CM

## 2013-02-08 ENCOUNTER — Encounter (HOSPITAL_COMMUNITY): Payer: Self-pay | Admitting: Emergency Medicine

## 2013-02-08 ENCOUNTER — Emergency Department (HOSPITAL_COMMUNITY)
Admission: EM | Admit: 2013-02-08 | Discharge: 2013-02-08 | Disposition: A | Discharge status: Home / Self Care | Payer: Medicare Other | Attending: Emergency Medicine | Admitting: Emergency Medicine

## 2013-02-08 DIAGNOSIS — S46912A Strain of unspecified muscle, fascia and tendon at shoulder and upper arm level, left arm, initial encounter: Secondary | ICD-10-CM

## 2013-02-08 DIAGNOSIS — Y9389 Activity, other specified: Secondary | ICD-10-CM | POA: Insufficient documentation

## 2013-02-08 DIAGNOSIS — Z8679 Personal history of other diseases of the circulatory system: Secondary | ICD-10-CM | POA: Insufficient documentation

## 2013-02-08 DIAGNOSIS — Y929 Unspecified place or not applicable: Secondary | ICD-10-CM | POA: Insufficient documentation

## 2013-02-08 DIAGNOSIS — I10 Essential (primary) hypertension: Secondary | ICD-10-CM | POA: Insufficient documentation

## 2013-02-08 DIAGNOSIS — Z7982 Long term (current) use of aspirin: Secondary | ICD-10-CM | POA: Insufficient documentation

## 2013-02-08 DIAGNOSIS — X500XXA Overexertion from strenuous movement or load, initial encounter: Secondary | ICD-10-CM | POA: Insufficient documentation

## 2013-02-08 DIAGNOSIS — IMO0002 Reserved for concepts with insufficient information to code with codable children: Secondary | ICD-10-CM | POA: Insufficient documentation

## 2013-02-08 DIAGNOSIS — Z79899 Other long term (current) drug therapy: Secondary | ICD-10-CM | POA: Insufficient documentation

## 2013-02-08 DIAGNOSIS — Z791 Long term (current) use of non-steroidal anti-inflammatories (NSAID): Secondary | ICD-10-CM | POA: Insufficient documentation

## 2013-02-08 MED ORDER — HYDROCODONE-ACETAMINOPHEN 5-325 MG PO TABS: 1.0000 | ORAL_TABLET | ORAL | Status: DC | PRN

## 2013-02-08 NOTE — ED Notes (Signed)
PT reports Arm pain for 2 months after lifting something heavy.

## 2013-02-08 NOTE — ED Provider Notes (Signed)
CSN: [id number]     Arrival date & time 02/08/13  0825 History   First MD Initiated Contact with Patient 02/08/13 0831     No chief complaint on file.  (Consider location/radiation/quality/duration/timing/severity/associated sxs/prior Treatment) HPI Comments: Patient presents to the ER for evaluation of left shoulder and elbow pain. Patient reports that symptoms began 2 months ago while trying to move a heavy king-sized headboard. She tried to lift the headboard and felt pain. She has been using Aleve for the inflammation but has not been helping much. Patient reports increased pain when she lifts her arm. She does not notice any decreased range of motion, however. Patient reports she thought the pain would get better, but it has not. In addition to the pain in the shoulder, she feels pain down in the medial aspect of the left elbow that worsens with movement as well. All pain is mild to moderate, severe with movements.   Past Medical History  Diagnosis Date  . Hypertension   . Migraines   . Gum disease     dentures worn   Past Surgical History  Procedure Laterality Date  . Tubal ligation     No family history on file. History  Substance Use Topics  . Smoking status: Never Smoker   . Smokeless tobacco: Not on file  . Alcohol Use: Yes     Comment: glass of wine   OB History   Grav Para Term Preterm Abortions TAB SAB Ect Mult Living                 Review of Systems  Musculoskeletal: Positive for arthralgias (left shoulder, left elbow) and myalgias (Left upper arm and shoulder).  All other systems reviewed and are negative.    Allergies  Review of patient's allergies indicates no known allergies.  Home Medications   Current Outpatient Rx  Name  Route  Sig  Dispense  Refill  . aspirin EC 81 MG tablet   Oral   Take 81 mg by mouth daily.         . naproxen sodium (ANAPROX) 220 MG tablet   Oral   Take 220 mg by mouth 2 (two) times daily with a meal.         .  olmesartan-hydrochlorothiazide (BENICAR HCT) 40-12.5 MG per tablet   Oral   Take 1 tablet by mouth daily.           . SUMAtriptan (IMITREX) 25 MG tablet   Oral   Take 25 mg by mouth as needed for migraine.           BP 143/71  Pulse 62  Temp(Src) 97.7 F (36.5 C) (Oral)  Resp 18  SpO2 100% Physical Exam  Constitutional: She is oriented to person, place, and time. She appears well-developed and well-nourished. No distress.  HENT:  Head: Normocephalic and atraumatic.  Right Ear: Hearing normal.  Left Ear: Hearing normal.  Nose: Nose normal.  Mouth/Throat: Oropharynx is clear and moist and mucous membranes are normal.  Eyes: Conjunctivae and EOM are normal. Pupils are equal, round, and reactive to light.  Neck: Normal range of motion. Neck supple.  Cardiovascular: Regular rhythm, S1 normal and S2 normal.  Exam reveals no gallop and no friction rub.   No murmur heard. Pulmonary/Chest: Effort normal and breath sounds normal. No respiratory distress. She exhibits no tenderness.  Abdominal: Soft. Normal appearance and bowel sounds are normal. There is no hepatosplenomegaly. There is no tenderness. There is no rebound, no  guarding, no tenderness at McBurney's point and negative Murphy's sign. No hernia.  Musculoskeletal: Normal range of motion.       Left shoulder: She exhibits tenderness and pain (Pain with raising arm and extreme abduction). She exhibits normal range of motion and no deformity.       Cervical back: Normal. She exhibits normal range of motion and no tenderness.  Neurological: She is alert and oriented to person, place, and time. She has normal strength. No cranial nerve deficit or sensory deficit. Coordination normal. GCS eye subscore is 4. GCS verbal subscore is 5. GCS motor subscore is 6.  Skin: Skin is warm, dry and intact. No rash noted. No cyanosis.  Psychiatric: She has a normal mood and affect. Her speech is normal and behavior is normal. Thought content normal.     ED Course  Procedures (including critical care time) Labs Review Labs Reviewed - No data to display Imaging Review No results found.  EKG Interpretation   None       MDM  Left shoulder pain, possible rotator cuff injury  The patient presents to the ER with left shoulder pain persisted for 2 months after an injury while lifting a heavy object. She has preserved range of motion, but painful in addition. There are no deformities noted. Neurologic examination is normal, no sign of injury or pathology. I do have concern for structural injury to the rotator cuff, et Karie Soda. The patient will be treated with rest and analgesia, referral to orthopedics.    Gilda Crease, MD 02/08/13 [phone]

## 2014-03-28 ENCOUNTER — Ambulatory Visit (INDEPENDENT_AMBULATORY_CARE_PROVIDER_SITE_OTHER): Payer: Medicare Other | Admitting: Family Medicine

## 2014-03-28 VITALS — BP 122/72 | HR 63 | Temp 98.9°F | Resp 17 | Ht 60.5 in | Wt 170.0 lb

## 2014-03-28 DIAGNOSIS — R21 Rash and other nonspecific skin eruption: Secondary | ICD-10-CM

## 2014-03-28 DIAGNOSIS — L299 Pruritus, unspecified: Secondary | ICD-10-CM

## 2014-03-28 DIAGNOSIS — L308 Other specified dermatitis: Secondary | ICD-10-CM

## 2014-03-28 MED ORDER — CLOBETASOL PROPIONATE 0.05 % EX CREA: 1.0000 "application " | TOPICAL_CREAM | Freq: Two times a day (BID) | CUTANEOUS | Status: DC

## 2014-03-28 MED ORDER — HYDROXYZINE HCL 50 MG PO TABS
50.0000 mg | ORAL_TABLET | Freq: Three times a day (TID) | ORAL | Status: DC | PRN
Start: 2014-03-28 — End: 2014-07-26

## 2014-03-28 MED ORDER — METHYLPREDNISOLONE (PAK) 4 MG PO TABS: ORAL_TABLET | ORAL | Status: DC

## 2014-03-28 NOTE — Progress Notes (Signed)
Chief Complaint:  Chief Complaint  Patient presents with  . Rash    all over body     HPI: Sara Obrien is a 68 y.o. female who is here for rash on her body and started on chest and also on arm, 2 weeks ago, started , has been taking benadryl and also otc cream, she has bathed in otameal soap. Sh states it tingles and then itches. She ahs had no new meds, no new foods, no new travels, no new furniture or old furniture. No new lotiosn or soaps. No SOB, no Wheezing, NO CP currently. No food allergies. Denies diabetes  Past Medical History  Diagnosis Date  . Hypertension   . Migraines   . Gum disease     dentures worn  . Anxiety   . Hyperlipidemia    Past Surgical History  Procedure Laterality Date  . Tubal ligation    . Breast lumpectomy     History   Social History  . Marital Status: Married    Spouse Name: N/A    Number of Children: N/A  . Years of Education: N/A   Social History Main Topics  . Smoking status: Never Smoker   . Smokeless tobacco: None  . Alcohol Use: Yes     Comment: glass of wine  . Drug Use: No  . Sexual Activity: No   Other Topics Concern  . None   Social History Narrative   History reviewed. No pertinent family history. No Known Allergies Prior to Admission medications   Medication Sig Start Date End Date Taking? Authorizing Provider  escitalopram (LEXAPRO) 20 MG tablet Take 20 mg by mouth daily.   Yes Historical Provider, MD  olmesartan-hydrochlorothiazide (BENICAR HCT) 40-12.5 MG per tablet Take 1 tablet by mouth daily.     Yes Historical Provider, MD  SUMAtriptan (IMITREX) 25 MG tablet Take 25 mg by mouth as needed for migraine.    Yes Historical Provider, MD  naproxen sodium (ANAPROX) 220 MG tablet Take 220 mg by mouth 2 (two) times daily with a meal.    Historical Provider, MD     ROS: The patient denies fevers, chills, night sweats, unintentional weight loss, chest pain, palpitations, wheezing, dyspnea on exertion, nausea,  vomiting, abdominal pain, dysuria, hematuria, melena, numbness, weakness,   All other systems have been reviewed and were otherwise negative with the exception of those mentioned in the HPI and as above.    PHYSICAL EXAM: Filed Vitals:   03/28/14 1040  BP: 122/72  Pulse: 63  Temp: 98.9 F (37.2 C)  Resp: 17   Filed Vitals:   03/28/14 1040  Height: 5' 0.5" (1.537 m)  Weight: 170 lb (77.111 kg)   Body mass index is 32.64 kg/(m^2).  General: Alert, no acute distress HEENT:  Normocephalic, atraumatic, oropharynx patent. EOMI, PERRLA Cardiovascular:  Regular rate and rhythm, no rubs murmurs or gallops.  No Carotid bruits, radial pulse intact. No pedal edema.  Respiratory: Clear to auscultation bilaterally.  No wheezes, rales, or rhonchi.  No cyanosis, no use of accessory musculature GI: No organomegaly, abdomen is soft and non-tender, positive bowel sounds.  No masses. Skin: + contact dermatitis like/ eczeamtous rashes. Neurologic: Facial musculature symmetric. Psychiatric: Patient is appropriate throughout our interaction. Lymphatic: No cervical lymphadenopathy Musculoskeletal: Gait intact.   LABS: Results for orders placed or performed during the hospital encounter of 11/08/10  I-STAT, chem 8  Result Value Ref Range   Sodium 139 135 - 145 mEq/L  Potassium 3.2 (L) 3.5 - 5.1 mEq/L   Chloride 101 96 - 112 mEq/L   BUN 10 6 - 23 mg/dL   Creatinine, Ser 0.90 0.50 - 1.10 mg/dL   Glucose, Bld 99 70 - 99 mg/dL   Calcium, Ion 1.19 1.12 - 1.32 mmol/L   TCO2 27 0 - 100 mmol/L   Hemoglobin 13.9 12.0 - 15.0 g/dL   HCT 41.0 36.0 - 46.0 %     EKG/XRAY:   Primary read interpreted by Dr. Marin Comment at [facility].   ASSESSMENT/PLAN: Encounter Diagnoses  Name Primary?  . Rash and nonspecific skin eruption Yes  . Pruritic dermatitis     Rx Medrol dose pack, Vystaril, and clobetasol cream ( not to put on face) F/u prn   Gross sideeffects, risk and benefits, and alternatives of medications  d/w patient. Patient is aware that all medications have potential sideeffects and we are unable to predict every sideeffect or drug-drug interaction that may occur.  Sherlon Nied, [place], DO 03/30/2014 2:22 PM

## 2014-07-26 ENCOUNTER — Ambulatory Visit (INDEPENDENT_AMBULATORY_CARE_PROVIDER_SITE_OTHER): Payer: Medicare Other | Admitting: Physician Assistant

## 2014-07-26 VITALS — BP 122/76 | HR 65 | Temp 98.4°F | Resp 16 | Ht 60.0 in | Wt 177.6 lb

## 2014-07-26 DIAGNOSIS — L309 Dermatitis, unspecified: Secondary | ICD-10-CM

## 2014-07-26 DIAGNOSIS — B888 Other specified infestations: Secondary | ICD-10-CM | POA: Diagnosis not present

## 2014-07-26 MED ORDER — RANITIDINE HCL 150 MG PO TABS: ORAL_TABLET | ORAL | Status: DC

## 2014-07-26 MED ORDER — CETIRIZINE HCL 10 MG PO TABS: 10.0000 mg | ORAL_TABLET | Freq: Every day | ORAL | Status: DC

## 2014-07-26 NOTE — Patient Instructions (Signed)
Take zantac in the mornings. Take zyrtec in the evening before bed. May apply hydrocortisone to affected area. Return if not getting better in 2 weeks.

## 2014-07-26 NOTE — Progress Notes (Signed)
Subjective:    Patient ID: Sara Obrien, female    DOB: 05-Feb-1946, 69 y.o.   MRN: [id number]  HPI  This is a 69 year old female who is presenting with pruritic rash x 3 days. Reports 4 months ago she had a bed bug infestation that she thinks she got from a hotel in [place]. She got her house fumigated at that time. She was seen here as well and prescribed atarax and triamcinolone cream. Reports atarax worked but steroid cream did not. She reports 3 days ago she woke up itchy over arms, leg and face. She found one bed bug and has saved it to show to exterminator. The exterminators are at her house now fumigating. This fumigation will be free as she was given a 100% guarantee last time. She has contacted her lawyer. She denies fever or chills. She has not tried anything for her symptoms so far.  Review of Systems  Constitutional: Negative for fever and chills.  Gastrointestinal: Negative for nausea and vomiting.  Skin: Positive for rash.  Hematological: Negative for adenopathy.  Psychiatric/Behavioral: Positive for sleep disturbance.    There are no active problems to display for this patient.  Prior to Admission medications   Medication Sig Start Date End Date Taking? Authorizing Provider  escitalopram (LEXAPRO) 20 MG tablet Take 20 mg by mouth daily.   Yes Historical Provider, MD         naproxen sodium (ANAPROX) 220 MG tablet Take 220 mg by mouth 2 (two) times daily with a meal.   Yes Historical Provider, MD  olmesartan-hydrochlorothiazide (BENICAR HCT) 40-12.5 MG per tablet Take 1 tablet by mouth daily.     Yes Historical Provider, MD  SUMAtriptan (IMITREX) 25 MG tablet Take 25 mg by mouth as needed for migraine.    Yes Historical Provider, MD          No Known Allergies  Patient's social and family history were reviewed.     Objective:   Physical Exam  Constitutional: She is oriented to person, place, and time. She appears well-developed and well-nourished. No distress.    HENT:  Head: Normocephalic and atraumatic.  Right Ear: Hearing normal.  Left Ear: Hearing normal.  Nose: Nose normal.  Eyes: Conjunctivae and lids are normal. Right eye exhibits no discharge. Left eye exhibits no discharge. No scleral icterus.  Cardiovascular: Normal rate, regular rhythm and normal pulses.   Pulmonary/Chest: Effort normal and breath sounds normal. No respiratory distress.  Musculoskeletal: Normal range of motion.  Neurological: She is alert and oriented to person, place, and time.  Skin: Skin is warm, dry and intact.  Maculopapular rash on chin, bilateral forearms and right calf.   Psychiatric: She has a normal mood and affect. Her speech is normal and behavior is normal. Thought content normal.   BP 122/76 mmHg  Pulse 65  Temp(Src) 98.4 F (36.9 C) (Oral)  Resp 16  Ht 5' (1.524 m)  Wt 177 lb 9.6 oz (80.559 kg)  BMI 34.69 kg/m2  SpO2 99%     Assessment & Plan:  1. Infestation by bed bug 2. Dermatitis Pt will take zantac QAM and zyrtec QHS. OTC hydrocortisone for topical relief. She is getting her home fumigated today. She will return if not getting better in 7-10 days.  - cetirizine (ZYRTEC) 10 MG tablet; Take 1 tablet (10 mg total) by mouth daily.  Dispense: 30 tablet; Refill: 0 - ranitidine (ZANTAC) 150 MG tablet; Take 1 tab po QAM  Dispense:  30 tablet; Refill: 0   Benjaman Pott. Drenda Freeze, MHS [facility]  07/26/2014

## 2017-06-19 ENCOUNTER — Telehealth: Payer: Self-pay | Admitting: Cardiovascular Disease

## 2017-06-19 NOTE — Telephone Encounter (Signed)
Received incoming records from [facility] for upcoming appointment on 07/01/17 @ 9:40am with Dr. Oval Linsey. Records given to [facility] in Medical Records. 06/19/17 ab

## 2017-06-30 NOTE — Progress Notes (Signed)
Cardiology Office Note   Date:  07/01/2017   ID:  Sara Obrien, Sara Obrien Sep 25, 1945, MRN [id number]  PCP:  [name], MD  Cardiologist:   Skeet Latch, MD   No chief complaint on file.     History of Present Illness: Sara Obrien is a 72 y.o. female with hypertension, hyperlipidemia, and CKD II who is being seen today for the evaluation of chest pain at the request of [name], MD.  Ms. Loveall has been experiencing intermittent episodes of chest pain for 5 months.  She first noticed it November 2018.  It occurs both at rest and with exertion.  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  The episodes are not associated with shortness of breath or nausea.  She does sometimes get diaphoretic.  She also has episodes of left arm weakness, numbness, and tingling.  This is separate from the chest discomfort.  However she first noticed it around the same time.  There are no clear triggers.  The arm discomfort is constantly there but varies in intensity.  At worst is 8 out of 10 in severity.  She does report having neck pain and notes that rubbing her arm or using a hot pack seems to help.  She notes that she is been more stressed than usual lately.  Her adult daughter lives with her.  Her husband also had a motor vehicle accident May 2018.  She has not noted any lower extremity edema, orthopnea, or PND.  Her blood pressure at home is been in the 120s over 70s.  She reports a history of whitecoat hypertension.   Past Medical History:  Diagnosis Date  . Anxiety   . Atypical chest pain 07/01/2017  . Essential hypertension 07/01/2017  . Gum disease    dentures worn  . Hyperlipidemia   . Hypertension   . Migraines     Past Surgical History:  Procedure Laterality Date  . BREAST LUMPECTOMY    . TUBAL LIGATION       Current Outpatient Medications  Medication Sig Dispense Refill  . Cholecalciferol (D3 HIGH POTENCY)  2000 units CAPS Take 1 capsule by mouth daily.    [name] escitalopram (LEXAPRO) 20 MG tablet Take 20 mg by mouth daily.    [name] olmesartan-hydrochlorothiazide (BENICAR HCT) 40-12.5 MG per tablet Take 1 tablet by mouth daily.      . Pitavastatin Calcium 4 MG TABS Take 4 mg by mouth daily.    . SUMAtriptan (IMITREX) 25 MG tablet Take 25 mg by mouth as needed for migraine.      No current facility-administered medications for this visit.     Allergies:   Patient has no known allergies.    Social History:  The patient  reports that she has never smoked. She has never used smokeless tobacco. She reports that she drinks alcohol. She reports that she does not use drugs.   Family History:  The patient's family history includes Brain cancer in her mother; Breast cancer in her sister; Cancer in her brother, brother, and brother; Hypertension in her father, mother, and sister; Stroke in her father.    ROS:  Please see the history of present illness.   Otherwise, review of systems are positive for none.   All other systems are reviewed and negative.    PHYSICAL EXAM: VS:  BP (!) 146/84   Pulse (!) 56   Ht 5' (1.524  m)   Wt 170 lb 6.4 oz (77.3 kg)   BMI 33.28 kg/m  , BMI Body mass index is 33.28 kg/m. GENERAL:  Well appearing.  No acute distress.  HEENT:  Pupils equal round and reactive, fundi not visualized, oral mucosa unremarkable NECK:  No jugular venous distention, waveform within normal limits, carotid upstroke brisk and symmetric, no bruits LUNGS:  Clear to auscultation bilaterally HEART:  RRR.  PMI not displaced or sustained,S1 and S2 within normal limits, no S3, no S4, no clicks, no rubs, no murmurs ABD:  Flat, positive bowel sounds normal in frequency in pitch, no bruits, no rebound, no guarding, no midline pulsatile mass, no hepatomegaly, no splenomegaly EXT:  2 plus pulses throughout, no edema, no cyanosis no clubbing SKIN:  No rashes no nodules NEURO:  Cranial nerves II through XII  grossly intact, motor grossly intact throughout PSYCH:  Cognitively intact, oriented to person place and time   EKG:  EKG is ordered today. The ekg ordered today demonstrates sinus bradycardia.  Rate 56 bpm.  Anterolateral T wave inversions.   Recent Labs: No results found for requested labs within last 8760 hours.   05/09/17: Total cholesterol 162, triglycerides 59, HDL 64, LDL 86 Sodium 143, potassium 4.0, BUN 16, creatinine 0.77 AST 15, ALT 14  Lipid Panel No results found for: CHOL, TRIG, HDL, CHOLHDL, VLDL, LDLCALC, LDLDIRECT    Wt Readings from Last 3 Encounters:  07/01/17 170 lb 6.4 oz (77.3 kg)  07/26/14 177 lb 9.6 oz (80.6 kg)  03/28/14 170 lb (77.1 kg)      ASSESSMENT AND PLAN:  # Chest pain: Symptoms are atypical.  It is hard to tell if she has exertional symptoms given that she does not exert herself.  We will get an exercise Myoview to assess for ischemia.  # Hypertension: Blood pressure is elevated today but has been well-controlled at home.  She has a history of whitecoat hypertension.  Continue all losartan and hydrochlorthiazide.  # Hyperlipidemia: LDL was 86 on 05/2017.  Continue pitavastatin.   # Arm discomfort:  Likely nerve related.  Suggested that she investigate her neck as a source.  # CV Disease Prevention: If her stress testing is negative for ischemia she was encouraged to start exercising at least 150 minutes/week.   Current medicines are reviewed at length with the patient today.  The patient does not have concerns regarding medicines.  The following changes have been made:  no change  Labs/ tests ordered today include:   Orders Placed This Encounter  Procedures  . MYOCARDIAL PERFUSION IMAGING  . EKG 12-Lead     Disposition:   FU with Desmond Szabo C. Oval Linsey, MD, [facility] in 4-6 weeks.     This note was written with the assistance of speech recognition software.  Please excuse any transcriptional errors.  Signed, Avarose Mervine C. Oval Linsey, MD,  [facility]  07/01/2017 11:50 AM    [place]

## 2017-07-01 ENCOUNTER — Ambulatory Visit: Payer: Medicare Other | Admitting: Cardiovascular Disease

## 2017-07-01 ENCOUNTER — Encounter: Payer: Self-pay | Admitting: Cardiovascular Disease

## 2017-07-01 VITALS — BP 146/84 | HR 56 | Ht 60.0 in | Wt 170.4 lb

## 2017-07-01 DIAGNOSIS — E785 Hyperlipidemia, unspecified: Secondary | ICD-10-CM | POA: Insufficient documentation

## 2017-07-01 DIAGNOSIS — R0789 Other chest pain: Secondary | ICD-10-CM | POA: Diagnosis not present

## 2017-07-01 DIAGNOSIS — I10 Essential (primary) hypertension: Secondary | ICD-10-CM | POA: Diagnosis not present

## 2017-07-01 DIAGNOSIS — E7800 Pure hypercholesterolemia, unspecified: Secondary | ICD-10-CM | POA: Diagnosis not present

## 2017-07-01 DIAGNOSIS — R079 Chest pain, unspecified: Secondary | ICD-10-CM | POA: Diagnosis not present

## 2017-07-01 HISTORY — DX: Other chest pain: R07.89

## 2017-07-01 HISTORY — DX: Essential (primary) hypertension: I10

## 2017-07-01 NOTE — Patient Instructions (Addendum)
Medication Instructions:  Your physician recommends that you continue on your current medications as directed. Please refer to the Current Medication list given to you today.  Labwork: NONE  Testing/Procedures: Your physician has requested that you have en exercise stress myoview. For further information please visit [email or web address]. Please follow instruction sheet, as given.  Follow-Up: Your physician recommends that you schedule a follow-up appointment in: ABOUT 4-6 WEEKS  Any Other Special Instructions Will Be Listed Below (If Applicable).  TRY TO EXERCISE 150 MINUTES EACH WEEK   If you need a refill on your cardiac medications before your next appointment, please call your pharmacy.  Cardiac Nuclear Scan A cardiac nuclear scan is a test that measures blood flow to the heart when a person is resting and when he or she is exercising. The test looks for problems such as:  Not enough blood reaching a portion of the heart.  The heart muscle not working normally.  You may need this test if:  You have heart disease.  You have had abnormal lab results.  You have had heart surgery or angioplasty.  You have chest pain.  You have shortness of breath.  In this test, a radioactive dye (tracer) is injected into your bloodstream. After the tracer has traveled to your heart, an imaging device is used to measure how much of the tracer is absorbed by or distributed to various areas of your heart. This procedure is usually done at a hospital and takes 2-4 hours. Tell a health care provider about:  Any allergies you have.  All medicines you are taking, including vitamins, herbs, eye drops, creams, and over-the-counter medicines.  Any problems you or family members have had with the use of anesthetic medicines.  Any blood disorders you have.  Any surgeries you have had.  Any medical conditions you have.  Whether you are pregnant or may be pregnant. What are the  risks? Generally, this is a safe procedure. However, problems may occur, including:  Serious chest pain and heart attack. This is only a risk if the stress portion of the test is done.  Rapid heartbeat.  Sensation of warmth in your chest. This usually passes quickly.  What happens before the procedure?  Ask your health care provider about changing or stopping your regular medicines. This is especially important if you are taking diabetes medicines or blood thinners.  Remove your jewelry on the day of the procedure. What happens during the procedure?  An IV tube will be inserted into one of your veins.  Your health care provider will inject a small amount of radioactive tracer through the tube.  You will wait for 20-40 minutes while the tracer travels through your bloodstream.  Your heart activity will be monitored with an electrocardiogram (ECG).  You will lie down on an exam table.  Images of your heart will be taken for about 15-20 minutes.  You may be asked to exercise on a treadmill or stationary bike. While you exercise, your heart's activity will be monitored with an ECG, and your blood pressure will be checked. If you are unable to exercise, you may be given a medicine to increase blood flow to parts of your heart.  When blood flow to your heart has peaked, a tracer will again be injected through the IV tube.  After 20-40 minutes, you will get back on the exam table and have more images taken of your heart.  When the procedure is over, your IV tube will be removed. The  procedure may vary among health care providers and hospitals. Depending on the type of tracer used, scans may need to be repeated 3-4 hours later. What happens after the procedure?  Unless your health care provider tells you otherwise, you may return to your normal schedule, including diet, activities, and medicines.  Unless your health care provider tells you otherwise, you may increase your fluid  intake. This will help flush the contrast dye from your body. Drink enough fluid to keep your urine clear or pale yellow.  It is up to you to get your test results. Ask your health care provider, or the department that is doing the test, when your results will be ready. Summary  A cardiac nuclear scan measures the blood flow to the heart when a person is resting and when he or she is exercising.  You may need this test if you are at risk for heart disease.  Tell your health care provider if you are pregnant.  Unless your health care provider tells you otherwise, increase your fluid intake. This will help flush the contrast dye from your body. Drink enough fluid to keep your urine clear or pale yellow. This information is not intended to replace advice given to you by your health care provider. Make sure you discuss any questions you have with your health care provider. Document Released: 04/13/2004 Document Revised: 03/21/2016 Document Reviewed: 02/25/2013 Elsevier Interactive Patient Education  2017 [employer].

## 2017-07-17 ENCOUNTER — Telehealth (HOSPITAL_COMMUNITY): Payer: Self-pay

## 2017-07-17 NOTE — Telephone Encounter (Signed)
Encounter complete. 

## 2017-07-19 ENCOUNTER — Ambulatory Visit (HOSPITAL_COMMUNITY)
Admission: RE | Admit: 2017-07-19 | Discharge: 2017-07-19 | Disposition: A | Discharge status: Home / Self Care | Payer: Medicare Other | Source: Ambulatory Visit | Attending: Cardiology | Admitting: Cardiology

## 2017-07-19 DIAGNOSIS — R079 Chest pain, unspecified: Secondary | ICD-10-CM | POA: Diagnosis not present

## 2017-07-19 LAB — MYOCARDIAL PERFUSION IMAGING
CHL CUP NUCLEAR SDS: 2
CHL CUP NUCLEAR SRS: 0
CHL CUP RESTING HR STRESS: 53 {beats}/min
CSEPHR: 89 %
CSEPPHR: 134 {beats}/min
Estimated workload: 5.9 METS
Exercise duration (min): 5 min
Exercise duration (sec): 1 s
LV sys vol: 32 mL
LVDIAVOL: 74 mL (ref 46–106)
MPHR: 149 {beats}/min
RPE: 19
SSS: 2
TID: 1.13

## 2017-07-19 MED ORDER — TECHNETIUM TC 99M TETROFOSMIN IV KIT
10.3000 | PACK | Freq: Once | INTRAVENOUS | Status: AC | PRN
  Administered 2017-07-19: 10.3 via INTRAVENOUS
  Filled 2017-07-19: qty 11

## 2017-07-19 MED ORDER — TECHNETIUM TC 99M TETROFOSMIN IV KIT
30.9000 | PACK | Freq: Once | INTRAVENOUS | Status: AC | PRN
Start: 2017-07-19 — End: 2017-07-19
  Administered 2017-07-19: 30.9 via INTRAVENOUS
  Filled 2017-07-19: qty 31

## 2017-09-26 ENCOUNTER — Ambulatory Visit (INDEPENDENT_AMBULATORY_CARE_PROVIDER_SITE_OTHER): Payer: Medicare Other | Admitting: Cardiovascular Disease

## 2017-09-26 ENCOUNTER — Encounter: Payer: Self-pay | Admitting: Cardiovascular Disease

## 2017-09-26 VITALS — BP 120/68 | HR 64 | Ht 61.0 in | Wt 172.0 lb

## 2017-09-26 DIAGNOSIS — I10 Essential (primary) hypertension: Secondary | ICD-10-CM

## 2017-09-26 DIAGNOSIS — E7800 Pure hypercholesterolemia, unspecified: Secondary | ICD-10-CM | POA: Diagnosis not present

## 2017-09-26 NOTE — Patient Instructions (Signed)
Your physician wants you to follow-up in: ONE YEAR with Dr. Pat Kocher will receive a reminder letter in the mail two months in advance. If you don't receive a letter, please call our office to schedule the follow-up appointment.

## 2017-09-26 NOTE — Progress Notes (Signed)
Cardiology Office Note   Date:  09/26/2017   ID:  Trachelle, Low 03-10-1946, MRN [id number]  PCP:  [name], MD  Cardiologist:   Skeet Latch, MD   Chief Complaint  Patient presents with  . Follow-up      History of Present Illness: Sara Obrien is a 72 y.o. female with hypertension, hyperlipidemia, and CKD II here for follow up.  She was initially seen 07/2017 for the evaluation of chest pain.  At the time she had been experiencing intermittent chest pain for the preceding 5 months.  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  She was referred for [facility] 07/19/2017 that revealed LVEF 56% and no ischemia.  Since that time she has been feeling great.  She started playing pickle ball 4 days/week.  She works out up to 2 hours at a time and has no chest pain or shortness of breath.  She has not noted any lower extremity edema, orthopnea, or PND.  Rotations, lightheadedness, or dizziness.  At her last appointment her blood pressure was elevated but this was attributed to whitecoat hypertension.  Her blood pressure remains well having control at home.   Past Medical History:  Diagnosis Date  . Anxiety   . Atypical chest pain 07/01/2017  . Essential hypertension 07/01/2017  . Gum disease    dentures worn  . Hyperlipidemia   . Hypertension   . Migraines     Past Surgical History:  Procedure Laterality Date  . BREAST LUMPECTOMY    . TUBAL LIGATION       Current Outpatient Medications  Medication Sig Dispense Refill  . Cholecalciferol (D3 HIGH POTENCY) 2000 units CAPS Take 1 capsule by mouth daily.    [name] escitalopram (LEXAPRO) 20 MG tablet Take 20 mg by mouth daily.    [name] olmesartan-hydrochlorothiazide (BENICAR HCT) 40-12.5 MG per tablet Take 1 tablet by mouth daily.      . Pitavastatin  Calcium 4 MG TABS Take 4 mg by mouth daily.    . SUMAtriptan (IMITREX) 25 MG tablet Take 25 mg by mouth as needed for migraine.      No current facility-administered medications for this visit.     Allergies:   Patient has no known allergies.    Social History:  The patient  reports that she has never smoked. She has never used smokeless tobacco. She reports that she drinks alcohol. She reports that she does not use drugs.   Family History:  The patient's family history includes Brain cancer in her mother; Breast cancer in her sister; Cancer in her brother, brother, and brother; Hypertension in her father, mother, and sister; Stroke in her father.    ROS:  Please see the history of present illness.   Otherwise, review of systems are positive for none.   All other systems are reviewed and negative.    PHYSICAL EXAM: VS:  BP 120/68   Pulse 64   Ht 5\' 1"  (1.549 m)   Wt 172 lb (78 kg)   BMI 32.50 kg/m  , BMI Body mass index is 32.5 kg/m. GENERAL:  Well appearing HEENT: Pupils equal round and reactive, fundi not visualized, oral mucosa unremarkable NECK:  No jugular venous distention, waveform within normal limits, carotid upstroke brisk and symmetric, no bruits  LUNGS:  Clear to auscultation bilaterally HEART:  RRR.  PMI not displaced or sustained,S1 and S2 within normal limits, no S3, no S4, no clicks, no rubs, no murmurs ABD:  Flat, positive bowel sounds normal in frequency in pitch, no bruits, no rebound, no guarding, no midline pulsatile mass, no hepatomegaly, no splenomegaly EXT:  2 plus pulses throughout, no edema, no cyanosis no clubbing SKIN:  No rashes no nodules NEURO:  Cranial nerves II through XII grossly intact, motor grossly intact throughout PSYCH:  Cognitively intact, oriented to person place and time    EKG:  EKG is not ordered today. The ekg ordered today demonstrates sinus bradycardia.  Rate 56 bpm.  Anterolateral T wave inversions.  Exercise Myoview  07/19/2017:  Clinically negative, electrically negative for ischemia Extensive motion artifact made evaluation for ischemia difficult  Myovue with normal perfusion No ischemia  LVEF 56%  Low risk study.  Recent Labs: No results found for requested labs within last 8760 hours.   05/09/17: Total cholesterol 162, triglycerides 59, HDL 64, LDL 86 Sodium 143, potassium 4.0, BUN 16, creatinine 0.77 AST 15, ALT 14  Lipid Panel No results found for: CHOL, TRIG, HDL, CHOLHDL, VLDL, LDLCALC, LDLDIRECT    Wt Readings from Last 3 Encounters:  09/26/17 172 lb (78 kg)  07/19/17 170 lb (77.1 kg)  07/01/17 170 lb 6.4 oz (77.3 kg)      ASSESSMENT AND PLAN:  # Chest pain: Resolved.  Lexiscan Myoview was negative for ischemia 07/2016.  # Hypertension: Blood pressure is well-controlled on home olmesartan/HCTZ.  # Hyperlipidemia: LDL was 86 on 05/2017.  Continue pitavastatin.   # Arm discomfort: Improving.  Likely nerve related.   # CV Disease Prevention: Sara Obrien was congratulated on her increased exercise.   Current medicines are reviewed at length with the patient today.  The patient does not have concerns regarding medicines.  The following changes have been made:  no change  Labs/ tests ordered today include:   No orders of the defined types were placed in this encounter.    Disposition:   FU with Amelia Burgard C. Oval Linsey, MD, [facility] in 1 year.      Signed, Geno Sydnor C. Oval Linsey, MD, [facility]  09/26/2017 1:04 PM    [facility]

## 2018-03-21 ENCOUNTER — Other Ambulatory Visit: Payer: Self-pay | Admitting: Internal Medicine

## 2018-04-29 ENCOUNTER — Ambulatory Visit (INDEPENDENT_AMBULATORY_CARE_PROVIDER_SITE_OTHER): Payer: Medicare HMO | Admitting: Internal Medicine

## 2018-04-29 ENCOUNTER — Encounter: Payer: Self-pay | Admitting: Internal Medicine

## 2018-04-29 VITALS — BP 142/86 | HR 59 | Temp 99.0°F | Ht 61.8 in | Wt 176.4 lb

## 2018-04-29 DIAGNOSIS — I129 Hypertensive chronic kidney disease with stage 1 through stage 4 chronic kidney disease, or unspecified chronic kidney disease: Secondary | ICD-10-CM

## 2018-04-29 DIAGNOSIS — J0110 Acute frontal sinusitis, unspecified: Secondary | ICD-10-CM | POA: Diagnosis not present

## 2018-04-29 DIAGNOSIS — R509 Fever, unspecified: Secondary | ICD-10-CM | POA: Diagnosis not present

## 2018-04-29 DIAGNOSIS — R6889 Other general symptoms and signs: Secondary | ICD-10-CM

## 2018-04-29 DIAGNOSIS — N182 Chronic kidney disease, stage 2 (mild): Secondary | ICD-10-CM

## 2018-04-29 DIAGNOSIS — J029 Acute pharyngitis, unspecified: Secondary | ICD-10-CM | POA: Diagnosis not present

## 2018-04-29 LAB — POC INFLUENZA A&B (BINAX/QUICKVUE)
INFLUENZA A, POC: NEGATIVE
INFLUENZA B, POC: NEGATIVE

## 2018-04-29 LAB — POCT RAPID STREP A (OFFICE): RAPID STREP A SCREEN: NEGATIVE

## 2018-04-29 MED ORDER — HYDROCODONE-HOMATROPINE 5-1.5 MG/5ML PO SYRP
5.0000 mL | ORAL_SOLUTION | Freq: Four times a day (QID) | ORAL | 0 refills | Status: DC | PRN
Start: 2018-04-29 — End: 2019-06-16

## 2018-04-29 MED ORDER — CEFTRIAXONE SODIUM 500 MG IJ SOLR
500.0000 mg | Freq: Once | INTRAMUSCULAR | Status: AC
  Administered 2018-04-29: 500 mg via INTRAMUSCULAR

## 2018-04-29 MED ORDER — AMOXICILLIN-POT CLAVULANATE 875-125 MG PO TABS: 1.0000 | ORAL_TABLET | Freq: Two times a day (BID) | ORAL | 0 refills | Status: AC

## 2018-04-29 NOTE — Progress Notes (Signed)
Subjective:     Patient ID: Sara Obrien , female    DOB: Nov 13, 1945 , 73 y.o.   MRN: [id number]   Chief Complaint  Patient presents with  . Headache  . Sore Throat    HPI  Headache   This is a new problem. The current episode started in the past 7 days. The problem occurs intermittently. The pain is located in the frontal region. The pain does not radiate. The quality of the pain is described as dull. The pain is at a severity of 4/10. The pain is mild. Associated symptoms include a fever, sinus pressure and a sore throat.  Sore Throat   This is a recurrent problem. The current episode started in the past 7 days. The problem has been gradually worsening. Neither side of throat is experiencing more pain than the other. The pain is at a severity of 4/10. Associated symptoms include headaches.     Past Medical History:  Diagnosis Date  . Anxiety   . Atypical chest pain 07/01/2017  . Essential hypertension 07/01/2017  . Gum disease    dentures worn  . Hyperlipidemia   . Hypertension   . Migraines      Family History  Problem Relation Age of Onset  . Brain cancer Mother   . Hypertension Mother   . Stroke Father   . Hypertension Father   . Hypertension Sister   . Cancer Brother   . Cancer Brother   . Cancer Brother   . Breast cancer Sister      Current Outpatient Medications:  .  acetaminophen (TYLENOL) 500 MG tablet, Take 500 mg by mouth every 6 (six) hours as needed., Disp: , Rfl:  .  Cholecalciferol (D3 HIGH POTENCY) 2000 units CAPS, Take 1 capsule by mouth daily., Disp: , Rfl:  .  Docosahexaenoic Acid (DHA PO), Take by mouth., Disp: , Rfl:  .  escitalopram (LEXAPRO) 20 MG tablet, TAKE 1 TABLET BY MOUTH EVERY DAY, Disp: 90 tablet, Rfl: 0 .  olmesartan-hydrochlorothiazide (BENICAR HCT) 40-12.5 MG per tablet, Take 1 tablet by mouth daily.  , Disp: , Rfl:  .  Omega-3 Fatty Acids (OMEGA 3 500 PO), Take by mouth., Disp: , Rfl:  .  Pitavastatin Calcium 4 MG TABS, Take 4 mg  by mouth daily., Disp: , Rfl:  .  amoxicillin-clavulanate (AUGMENTIN) 875-125 MG tablet, Take 1 tablet by mouth every 12 (twelve) hours for 10 days., Disp: 20 tablet, Rfl: 0 .  HYDROcodone-homatropine (HYDROMET) 5-1.5 MG/5ML syrup, Take 5 mLs by mouth every 6 (six) hours as needed., Disp: 120 mL, Rfl: 0 .  SUMAtriptan (IMITREX) 25 MG tablet, Take 25 mg by mouth as needed for migraine. , Disp: , Rfl:    No Known Allergies   Review of Systems  Constitutional: Positive for chills, fatigue and fever.  HENT: Positive for sinus pressure and sore throat.   Respiratory: Negative.   Gastrointestinal: Negative.   Musculoskeletal: Positive for myalgias.  Neurological: Positive for headaches.  Psychiatric/Behavioral: Negative.      Today's Vitals   04/29/18 1031  BP: (!) 142/86  Pulse: (!) 59  Temp: 99 F (37.2 C)  TempSrc: Oral  Weight: 176 lb 6.4 oz (80 kg)  Height: 5' 1.8" (1.57 m)  PainSc: 6    Body mass index is 32.47 kg/m.   Objective:  Physical Exam Vitals signs and nursing note reviewed.  Constitutional:      Appearance: She is well-developed. She is obese. She is ill-appearing.  HENT:     Head: Normocephalic and atraumatic.     Comments: Frontal sinuses tender to palpation    Right Ear: Tympanic membrane and ear canal normal.     Left Ear: Tympanic membrane and ear canal normal.     Mouth/Throat:     Mouth: Mucous membranes are moist.     Pharynx: Posterior oropharyngeal erythema present.  Eyes:     Extraocular Movements: Extraocular movements intact.     Pupils: Pupils are equal, round, and reactive to light.  Cardiovascular:     Rate and Rhythm: Normal rate and regular rhythm.     Heart sounds: Normal heart sounds.  Pulmonary:     Effort: Pulmonary effort is normal.     Breath sounds: Normal breath sounds.  Neurological:     Mental Status: She is alert.         Assessment And Plan:     1. Flu-like symptoms  Flu A/B test is negative. She is advised to take  tylenol prn.   - acetaminophen (TYLENOL) 500 MG tablet; Take 500 mg by mouth every 6 (six) hours as needed. - POC Influenza A&B(BINAX/QUICKVUE)  2. Pharyngitis, unspecified etiology  Strep test is negative.    - amoxicillin-clavulanate (AUGMENTIN) 875-125 MG tablet; Take 1 tablet by mouth every 12 (twelve) hours for 10 days.  Dispense: 20 tablet; Refill: 0 - POCT rapid strep A  3. Acute non-recurrent frontal sinusitis  She was given rx as listed below. She was also given rx augmentin to take as directed. She is encouraged complete full abx course.   - HYDROcodone-homatropine (HYDROMET) 5-1.5 MG/5ML syrup; Take 5 mLs by mouth every 6 (six) hours as needed.  Dispense: 120 mL; Refill: 0 - cefTRIAXone (ROCEPHIN) injection 500 mg  4. Hypertensive nephropathy  Fair control. Elevation is likely related to acute illness. She will continue with current meds for now. She agrees to f/u next week as previously scheduled.  5. Chronic renal disease, stage II  These supplements were added to her medication list. She is encouraged to stay well hydrated.   - Docosahexaenoic Acid (DHA PO); Take by mouth. - Omega-3 Fatty Acids (OMEGA 3 500 PO); Take by mouth.        [name], MD

## 2018-05-02 ENCOUNTER — Other Ambulatory Visit: Payer: Self-pay

## 2018-05-08 ENCOUNTER — Other Ambulatory Visit: Payer: Self-pay | Admitting: Internal Medicine

## 2018-05-08 ENCOUNTER — Other Ambulatory Visit: Payer: Self-pay

## 2018-05-08 ENCOUNTER — Ambulatory Visit (INDEPENDENT_AMBULATORY_CARE_PROVIDER_SITE_OTHER): Payer: Medicare HMO | Admitting: Internal Medicine

## 2018-05-08 ENCOUNTER — Encounter: Payer: Self-pay | Admitting: Internal Medicine

## 2018-05-08 VITALS — BP 116/72 | HR 60 | Temp 98.4°F | Ht 60.6 in | Wt 173.4 lb

## 2018-05-08 DIAGNOSIS — N182 Chronic kidney disease, stage 2 (mild): Secondary | ICD-10-CM

## 2018-05-08 DIAGNOSIS — E6609 Other obesity due to excess calories: Secondary | ICD-10-CM

## 2018-05-08 DIAGNOSIS — Z6833 Body mass index (BMI) 33.0-33.9, adult: Secondary | ICD-10-CM | POA: Diagnosis not present

## 2018-05-08 DIAGNOSIS — E7800 Pure hypercholesterolemia, unspecified: Secondary | ICD-10-CM | POA: Diagnosis not present

## 2018-05-08 DIAGNOSIS — I129 Hypertensive chronic kidney disease with stage 1 through stage 4 chronic kidney disease, or unspecified chronic kidney disease: Secondary | ICD-10-CM

## 2018-05-08 DIAGNOSIS — R0982 Postnasal drip: Secondary | ICD-10-CM

## 2018-05-08 DIAGNOSIS — Z1211 Encounter for screening for malignant neoplasm of colon: Secondary | ICD-10-CM

## 2018-05-08 MED ORDER — LEVOCETIRIZINE DIHYDROCHLORIDE 5 MG PO TABS: 5.0000 mg | ORAL_TABLET | Freq: Every evening | ORAL | 1 refills | Status: DC

## 2018-05-08 NOTE — Patient Instructions (Signed)
Postnasal Drip  Postnasal drip is the feeling of mucus going down the back of your throat. Mucus is a slimy substance that moistens and cleans your nose and throat, as well as the air pockets in face bones near your forehead and cheeks (sinuses). Small amounts of mucus pass from your nose and sinuses down the back of your throat all the time. This is normal. When you produce too much mucus or the mucus gets too thick, you can feel it.  Some common causes of postnasal drip include:   Having more mucus because of:  ? A cold or the flu.  ? Allergies.  ? Cold air.  ? Certain medicines.   Having more mucus that is thicker because of:  ? A sinus or nasal infection.  ? Dry air.  ? A food allergy.  Follow these instructions at home:  Relieving discomfort     Gargle with a salt-water mixture 3-4 times a day or as needed. To make a salt-water mixture, completely dissolve -1 tsp of salt in 1 cup of warm water.   If the air in your home is dry, use a humidifier to add moisture to the air.   Use a saline spray or container (neti pot) to flush out the nose (nasal irrigation). These methods can help clear away mucus and keep the nasal passages moist.  General instructions   Take over-the-counter and prescription medicines only as told by your health care provider.   Follow instructions from your health care provider about eating or drinking restrictions. You may need to avoid caffeine.   Avoid things that you know you are allergic to (allergens), like dust, mold, pollen, pets, or certain foods.   Drink enough fluid to keep your urine pale yellow.   Keep all follow-up visits as told by your health care provider. This is important.  Contact a health care provider if:   You have a fever.   You have a sore throat.   You have difficulty swallowing.   You have headache.   You have sinus pain.   You have a cough that does not go away.   The mucus from your nose becomes thick and is green or yellow in color.   You have  cold or flu symptoms that last more than 10 days.  Summary   Postnasal drip is the feeling of mucus going down the back of your throat.   If your health care provider approves, use nasal irrigation or a nasal spray 2?4 times a day.   Avoid things that you know you are allergic to (allergens), like dust, mold, pollen, pets, or certain foods.  This information is not intended to replace advice given to you by your health care provider. Make sure you discuss any questions you have with your health care provider.  Document Released: 07/02/2016 Document Revised: 07/02/2016 Document Reviewed: 07/02/2016  Elsevier Interactive Patient Education  2019 [employer]

## 2018-05-08 NOTE — Progress Notes (Signed)
Subjective:     Patient ID: Sara Obrien , female    DOB: 1945/04/10 , 73 y.o.   MRN: [id number]   Chief Complaint  Patient presents with  . Hypertension  . Hyperlipidemia    HPI  Hypertension  This is a chronic problem. The current episode started more than 1 year ago. The problem has been gradually improving since onset. The problem is controlled. Pertinent negatives include no blurred vision, chest pain, palpitations or shortness of breath. Risk factors for coronary artery disease include dyslipidemia, obesity, post-menopausal state and sedentary lifestyle. The current treatment provides moderate improvement. Hypertensive end-organ damage includes kidney disease. Identifiable causes of hypertension include chronic renal disease.  Hyperlipidemia  This is a chronic problem. The current episode started more than 1 year ago. The problem is controlled. Recent lipid tests were reviewed and are high. Exacerbating diseases include chronic renal disease and obesity. Pertinent negatives include no chest pain or shortness of breath.     Past Medical History:  Diagnosis Date  . Anxiety   . Atypical chest pain 07/01/2017  . Essential hypertension 07/01/2017  . Gum disease    dentures worn  . Hyperlipidemia   . Hypertension   . Migraines      Family History  Problem Relation Age of Onset  . Brain cancer Mother   . Hypertension Mother   . Stroke Father   . Hypertension Father   . Hypertension Sister   . Cancer Brother   . Cancer Brother   . Cancer Brother   . Breast cancer Sister      Current Outpatient Medications:  .  acetaminophen (TYLENOL) 500 MG tablet, Take 500 mg by mouth every 6 (six) hours as needed., Disp: , Rfl:  .  amoxicillin-clavulanate (AUGMENTIN) 875-125 MG tablet, Take 1 tablet by mouth every 12 (twelve) hours for 10 days., Disp: 20 tablet, Rfl: 0 .  Cholecalciferol (D3 HIGH POTENCY) 2000 units CAPS, Take 1 capsule by mouth daily., Disp: , Rfl:  .  escitalopram  (LEXAPRO) 20 MG tablet, TAKE 1 TABLET BY MOUTH EVERY DAY, Disp: 90 tablet, Rfl: 0 .  HYDROcodone-homatropine (HYDROMET) 5-1.5 MG/5ML syrup, Take 5 mLs by mouth every 6 (six) hours as needed., Disp: 120 mL, Rfl: 0 .  olmesartan-hydrochlorothiazide (BENICAR HCT) 40-12.5 MG per tablet, Take 1 tablet by mouth daily.  , Disp: , Rfl:  .  Omega-3 Fatty Acids (OMEGA 3 500 PO), Take by mouth., Disp: , Rfl:  .  Pitavastatin Calcium 4 MG TABS, Take 4 mg by mouth daily., Disp: , Rfl:  .  SUMAtriptan (IMITREX) 25 MG tablet, Take 25 mg by mouth as needed for migraine. , Disp: , Rfl:  .  Docosahexaenoic Acid (DHA PO), Take by mouth., Disp: , Rfl:  .  levocetirizine (XYZAL) 5 MG tablet, TAKE 1 TABLET(5 MG) BY MOUTH EVERY EVENING, Disp: 90 tablet, Rfl: 1   No Known Allergies   Review of Systems  Constitutional: Negative.   HENT: Positive for postnasal drip.   Eyes: Negative for blurred vision.  Respiratory: Negative.  Negative for shortness of breath.   Cardiovascular: Negative.  Negative for chest pain and palpitations.  Gastrointestinal: Negative.   Neurological: Negative.   Psychiatric/Behavioral: Negative.      Today's Vitals   05/08/18 0935  BP: 116/72  Pulse: 60  Temp: 98.4 F (36.9 C)  TempSrc: Oral  SpO2: 97%  Weight: 173 lb 6.4 oz (78.7 kg)  Height: 5' 0.6" (1.539 m)   Body mass index  is 33.2 kg/m.   Objective:  Physical Exam Vitals signs and nursing note reviewed.  Constitutional:      Appearance: Normal appearance. She is obese.  HENT:     Head: Normocephalic and atraumatic.  Cardiovascular:     Rate and Rhythm: Normal rate and regular rhythm.     Heart sounds: Normal heart sounds.  Pulmonary:     Effort: Pulmonary effort is normal.     Breath sounds: Normal breath sounds.  Neurological:     General: No focal deficit present.     Mental Status: She is alert.  Psychiatric:        Mood and Affect: Mood normal.        Behavior: Behavior normal.         Assessment And  Plan:     1. Hypertensive nephropathy  Well controlled. She will continue with current meds. She is encouraged to avoid adding salt to her foods.   - CMP14+EGFR  2. Chronic renal disease, stage II  Chronic, she is encouraged to stay well hydrated.   3. Pure hypercholesterolemia  SHE IS ENCOURAGED TO EXERCISE FIVE DAYS WEEKLY FOR AT LEAST 30 MINUTES, AVOID FRIED FOODS, EAT 25-35 GRAMS OF FIBER, AND TO EAT FISH AT LEAST TWICE WEEKLY.  - Lipid panel  4. Postnasal drip  She was given rx levocterizine '5mg'$  to take nighly as needed. She will let me know if her symptoms persist.   5. Class 1 obesity due to excess calories with serious comorbidity and body mass index (BMI) of 33.0 to 33.9 in adult  She is encouraged to initially strive for BMI less than 30 to decrease cardiac risk. She is advised to exercise no less than 150 minutes per week.    6. Screening for colon cancer  Last one documented was in 2007.  I will refer her to GI for CRC screening.   - Ambulatory referral to Gastroenterology        [name], MD

## 2018-05-09 LAB — CMP14+EGFR
ALBUMIN: 4.2 g/dL (ref 3.7–4.7)
ALT: 14 IU/L (ref 0–32)
AST: 20 IU/L (ref 0–40)
Albumin/Globulin Ratio: 1.1 — ABNORMAL LOW (ref 1.2–2.2)
Alkaline Phosphatase: 63 IU/L (ref 39–117)
BILIRUBIN TOTAL: 0.4 mg/dL (ref 0.0–1.2)
BUN / CREAT RATIO: 16 (ref 12–28)
BUN: 13 mg/dL (ref 8–27)
CALCIUM: 10.1 mg/dL (ref 8.7–10.3)
CHLORIDE: 101 mmol/L (ref 96–106)
CO2: 19 mmol/L — AB (ref 20–29)
Creatinine, Ser: 0.83 mg/dL (ref 0.57–1.00)
GFR, EST AFRICAN AMERICAN: 81 mL/min/{1.73_m2} (ref >59)
GFR, EST NON AFRICAN AMERICAN: 71 mL/min/{1.73_m2} (ref >59)
GLUCOSE: 86 mg/dL (ref 65–99)
Globulin, Total: 3.7 g/dL (ref 1.5–4.5)
Potassium: 4.2 mmol/L (ref 3.5–5.2)
Sodium: 142 mmol/L (ref 134–144)
TOTAL PROTEIN: 7.9 g/dL (ref 6.0–8.5)

## 2018-05-09 LAB — LIPID PANEL
Chol/HDL Ratio: 3.3 ratio (ref 0.0–4.4)
Cholesterol, Total: 183 mg/dL (ref 100–199)
HDL: 56 mg/dL (ref >39)
LDL CALC: 107 mg/dL — AB (ref 0–99)
Triglycerides: 98 mg/dL (ref 0–149)
VLDL Cholesterol Cal: 20 mg/dL (ref 5–40)

## 2018-05-14 ENCOUNTER — Telehealth: Payer: Self-pay

## 2018-05-14 NOTE — Telephone Encounter (Signed)
-----   Message from [name], MD sent at 05/10/2018  9:36 AM EST ----- Your liver and kidney fxn are stable. Be sure to stay well hydrated.  Your LDL is 107. We now have some samples of Livalo. pls give her 3 boxes.  Kw-pls start prior auth. Also, have pt check to be sure cost is not due to an insurance deductible. Thanks.

## 2018-05-14 NOTE — Telephone Encounter (Signed)
Left the pt a message to call back for her lab results. 

## 2018-05-16 ENCOUNTER — Other Ambulatory Visit: Payer: Self-pay | Admitting: Internal Medicine

## 2018-05-16 MED ORDER — AZITHROMYCIN 250 MG PO TABS: ORAL_TABLET | ORAL | 0 refills | Status: AC

## 2018-05-28 DIAGNOSIS — R194 Change in bowel habit: Secondary | ICD-10-CM | POA: Diagnosis not present

## 2018-05-28 DIAGNOSIS — R197 Diarrhea, unspecified: Secondary | ICD-10-CM | POA: Diagnosis not present

## 2018-05-28 DIAGNOSIS — R140 Abdominal distension (gaseous): Secondary | ICD-10-CM | POA: Diagnosis not present

## 2018-05-29 ENCOUNTER — Encounter: Payer: Self-pay | Admitting: Internal Medicine

## 2018-05-30 ENCOUNTER — Other Ambulatory Visit: Payer: Self-pay | Admitting: Internal Medicine

## 2018-05-30 ENCOUNTER — Other Ambulatory Visit: Payer: Self-pay

## 2018-05-30 MED ORDER — ATORVASTATIN CALCIUM 20 MG PO TABS: 20.0000 mg | ORAL_TABLET | Freq: Every day | ORAL | 1 refills | Status: DC

## 2018-06-06 ENCOUNTER — Encounter: Payer: Self-pay | Admitting: Internal Medicine

## 2018-07-07 ENCOUNTER — Ambulatory Visit: Payer: Self-pay | Admitting: Internal Medicine

## 2018-07-08 ENCOUNTER — Encounter: Payer: Self-pay | Admitting: Internal Medicine

## 2018-07-20 ENCOUNTER — Other Ambulatory Visit: Payer: Self-pay | Admitting: Internal Medicine

## 2018-08-15 ENCOUNTER — Other Ambulatory Visit: Payer: Self-pay | Admitting: Internal Medicine

## 2018-08-28 ENCOUNTER — Other Ambulatory Visit: Payer: Self-pay | Admitting: Internal Medicine

## 2018-09-02 DIAGNOSIS — Z20828 Contact with and (suspected) exposure to other viral communicable diseases: Secondary | ICD-10-CM | POA: Diagnosis not present

## 2018-10-13 ENCOUNTER — Encounter: Payer: Self-pay | Admitting: Internal Medicine

## 2018-10-20 ENCOUNTER — Other Ambulatory Visit: Payer: Self-pay | Admitting: Nurse Practitioner

## 2018-10-25 ENCOUNTER — Encounter (HOSPITAL_COMMUNITY): Payer: Self-pay

## 2018-10-25 ENCOUNTER — Ambulatory Visit (HOSPITAL_COMMUNITY)
Admission: EM | Admit: 2018-10-25 | Discharge: 2018-10-25 | Disposition: A | Discharge status: Home / Self Care | Payer: Medicare HMO | Attending: Family Medicine | Admitting: Family Medicine

## 2018-10-25 ENCOUNTER — Other Ambulatory Visit: Payer: Self-pay

## 2018-10-25 ENCOUNTER — Ambulatory Visit (INDEPENDENT_AMBULATORY_CARE_PROVIDER_SITE_OTHER): Payer: Medicare HMO

## 2018-10-25 DIAGNOSIS — Y9373 Activity, racquet and hand sports: Secondary | ICD-10-CM

## 2018-10-25 DIAGNOSIS — S43011A Anterior subluxation of right humerus, initial encounter: Secondary | ICD-10-CM | POA: Diagnosis not present

## 2018-10-25 DIAGNOSIS — S4991XA Unspecified injury of right shoulder and upper arm, initial encounter: Secondary | ICD-10-CM

## 2018-10-25 DIAGNOSIS — W19XXXA Unspecified fall, initial encounter: Secondary | ICD-10-CM

## 2018-10-25 DIAGNOSIS — M25511 Pain in right shoulder: Secondary | ICD-10-CM | POA: Diagnosis not present

## 2018-10-25 MED ORDER — CYCLOBENZAPRINE HCL 5 MG PO TABS: 5.0000 mg | ORAL_TABLET | Freq: Every day | ORAL | 0 refills | Status: DC

## 2018-10-25 MED ORDER — TRAMADOL HCL 50 MG PO TABS: 50.0000 mg | ORAL_TABLET | Freq: Four times a day (QID) | ORAL | 0 refills | Status: DC | PRN

## 2018-10-25 MED ORDER — MELOXICAM 15 MG PO TABS: 15.0000 mg | ORAL_TABLET | Freq: Every day | ORAL | 0 refills | Status: DC

## 2018-10-25 NOTE — ED Provider Notes (Signed)
[facility]    CSN: [id number] Arrival date & time: 10/25/18  1034      History   Chief Complaint Chief Complaint  Patient presents with  . Appointment    1050  . Fall    HPI YOLANDER GOODIE is a 73 y.o. female.   HPI MIRIAH MARUYAMA reports sustaining an injury to right shoulder while playing pickle ball 10 days ago. She was attempting to hit the ball and slipped falling directly onto her right shoulder. The pain has gradually worsened. She is unable to lift, flex, or rotate her right arm. She endorses weakness with gripping motion of the right hand. Mild numbness and tingling of the right fingers.She has attempted relief with ice, heat, and ibuprofen without marked improvement of symptoms. No prior fractures involving the right arm or shoulder. Past Medical History:  Diagnosis Date  . Anxiety   . Atypical chest pain 07/01/2017  . Essential hypertension 07/01/2017  . Gum disease    dentures worn  . Hyperlipidemia   . Hypertension   . Migraines     Patient Active Problem List   Diagnosis Date Noted  . Atypical chest pain 07/01/2017  . Essential hypertension 07/01/2017  . Hyperlipidemia 07/01/2017    Past Surgical History:  Procedure Laterality Date  . BREAST LUMPECTOMY    . TUBAL LIGATION      OB History   No obstetric history on file.      Home Medications    Prior to Admission medications   Medication Sig Start Date End Date Taking? Authorizing Provider  acetaminophen (TYLENOL) 500 MG tablet Take 500 mg by mouth every 6 (six) hours as needed.    [provider]  atorvastatin (LIPITOR) 20 MG tablet TAKE 1 TABLET(20 MG) BY MOUTH DAILY 08/28/18   [name], MD  Cholecalciferol (D3 HIGH POTENCY) 2000 units CAPS Take 1 capsule by mouth daily.    [provider]  Docosahexaenoic Acid (DHA PO) Take by mouth.    [provider]  escitalopram (LEXAPRO) 20 MG tablet TAKE 1 TABLET BY MOUTH EVERY DAY 08/19/18   [name],  MD  HYDROcodone-homatropine (HYDROMET) 5-1.5 MG/5ML syrup Take 5 mLs by mouth every 6 (six) hours as needed. 04/29/18   [name], MD  levocetirizine (XYZAL) 5 MG tablet TAKE 1 TABLET(5 MG) BY MOUTH EVERY EVENING 05/08/18   [name], MD  olmesartan-hydrochlorothiazide [facility] HCT) 40-12.5 MG tablet TAKE 1 TABLET BY MOUTH DAILY 10/20/18   [name], MD  Omega-3 Fatty Acids (OMEGA 3 500 PO) Take by mouth.    [provider]  SUMAtriptan (IMITREX) 25 MG tablet Take 25 mg by mouth as needed for migraine.     [provider]    Family History Family History  Problem Relation Age of Onset  . Brain cancer Mother   . Hypertension Mother   . Stroke Father   . Hypertension Father   . Hypertension Sister   . Cancer Brother   . Cancer Brother   . Cancer Brother   . Breast cancer Sister     Social History Social History   Tobacco Use  . Smoking status: Never Smoker  . Smokeless tobacco: Never Used  Substance Use Topics  . Alcohol use: Yes    Comment: glass of wine  . Drug use: No     Allergies   Patient has no known allergies.   Review of Systems Review of Systems Pertinent negatives listed in HPI Physical Exam  Triage Vital Signs ED Triage Vitals  Enc Vitals Group     BP 10/25/18 1053 118/78     Pulse Rate 10/25/18 1053 62     Resp 10/25/18 1053 18     Temp 10/25/18 1053 98.6 F (37 C)     Temp src --      SpO2 10/25/18 1053 98 %     Weight 10/25/18 1050 170 lb (77.1 kg)     Height --      Head Circumference --      Peak Flow --      Pain Score 10/25/18 1050 7     Pain Loc --      Pain Edu? --      Excl. in [place]? --    No data found.  Updated Vital Signs BP 118/78 (BP Location: Right Arm)   Pulse 62   Temp 98.6 F (37 C)   Resp 18   Wt 170 lb (77.1 kg)   SpO2 98%   BMI 32.55 kg/m   Visual Acuity Right Eye Distance:   Left Eye Distance:   Bilateral Distance:    Right Eye Near:   Left Eye Near:    Bilateral Near:      Physical Exam Constitutional:      Appearance: Normal appearance.  HENT:     Head: Normocephalic.  Neck:     Musculoskeletal: Muscular tenderness present.     Comments: Right sided tenderness of trapezius muscle with palpation  Cardiovascular:     Rate and Rhythm: Normal rate and regular rhythm.  Pulmonary:     Effort: Pulmonary effort is normal.  Musculoskeletal:     Right shoulder: She exhibits decreased range of motion, tenderness and decreased strength. She exhibits no swelling and no effusion.  Neurological:     General: No focal deficit present.     Mental Status: She is alert and oriented to person, place, and time.  Psychiatric:        Mood and Affect: Mood normal.    .  UC Treatments / Results  Labs (all labs ordered are listed, but only abnormal results are displayed) Labs Reviewed - No data to display  EKG   Radiology No results found.  Procedures Procedures (including critical care time)  Medications Ordered in UC Medications - No data to display  Initial Impression / Assessment and Plan / UC Course  I have reviewed the triage vital signs and the nursing notes.  Pertinent labs & imaging results that were available during my care of the patient were reviewed by me and considered in my medical decision making (see chart for details).     CERINA LEARY presents with a complaint of worsening right shoulder pain following a fall 10 days ago. Imaging performed, no acute fracture was revealed, however, joint dislocation could not completely be rule out with imaging. Shoulder sling applied to right arm. Short course of pain medication prescribed. Patient advised to Lajean Saver office on 10/27/18. Patient verbalized understanding and agreement with plan.  Final Clinical Impressions(s) / UC Diagnoses   Final diagnoses:  Fall, initial encounter  Injury of right shoulder, initial encounter     Discharge Instructions     I am recommending follow-up with  orthopedic speciality on Monday. Please call Dr. Lajean Saver office at [phone] to request a follow-up appointment from your visit here today. I have attached a copy of your x-ray results.  For pain, I have prescribed Tramadol 50-100 mg every  8 hours as needed for pain. Cyclobenzaprine 5 mg at bedtime for sleep. Meloxicam 15 mg once daily as needed for pain.    ED Prescriptions    Medication Sig Dispense Auth. Provider   traMADol (ULTRAM) 50 MG tablet Take 1 tablet (50 mg total) by mouth every 6 (six) hours as needed. 15 tablet Scot Jun, FNP   cyclobenzaprine (FLEXERIL) 5 MG tablet Take 1 tablet (5 mg total) by mouth at bedtime. 10 tablet Scot Jun, FNP   meloxicam (MOBIC) 15 MG tablet Take 1 tablet (15 mg total) by mouth daily. 20 tablet Scot Jun, FNP     Controlled Substance Prescriptions [place] Controlled Substance Registry consulted? Yes, I have consulted the [place] Controlled Substances Registry for this patient, and feel the risk/benefit ratio today is favorable for proceeding with this prescription for a controlled substance.   Scot Jun, FNP 10/27/18 1047

## 2018-10-25 NOTE — ED Triage Notes (Signed)
Pt states she fell 10 days ago on her right shoulder. Pt states the shoulder is very sore.

## 2018-10-25 NOTE — Discharge Instructions (Addendum)
I am recommending follow-up with orthopedic speciality on Monday. Please call Dr. Lajean Saver office at [phone] to request a follow-up appointment from your visit here today. I have attached a copy of your x-ray results.  For pain, I have prescribed Tramadol 50-100 mg every 8 hours as needed for pain. Cyclobenzaprine 5 mg at bedtime for sleep. Meloxicam 15 mg once daily as needed for pain.

## 2018-10-30 DIAGNOSIS — M25511 Pain in right shoulder: Secondary | ICD-10-CM | POA: Diagnosis not present

## 2018-10-31 DIAGNOSIS — M25511 Pain in right shoulder: Secondary | ICD-10-CM | POA: Diagnosis not present

## 2018-11-04 DIAGNOSIS — M25511 Pain in right shoulder: Secondary | ICD-10-CM | POA: Diagnosis not present

## 2018-11-18 DIAGNOSIS — M6281 Muscle weakness (generalized): Secondary | ICD-10-CM | POA: Diagnosis not present

## 2018-11-18 DIAGNOSIS — S46011D Strain of muscle(s) and tendon(s) of the rotator cuff of right shoulder, subsequent encounter: Secondary | ICD-10-CM | POA: Diagnosis not present

## 2018-11-18 DIAGNOSIS — M25511 Pain in right shoulder: Secondary | ICD-10-CM | POA: Diagnosis not present

## 2018-11-18 DIAGNOSIS — M25611 Stiffness of right shoulder, not elsewhere classified: Secondary | ICD-10-CM | POA: Diagnosis not present

## 2018-11-21 DIAGNOSIS — Z1231 Encounter for screening mammogram for malignant neoplasm of breast: Secondary | ICD-10-CM | POA: Diagnosis not present

## 2018-11-21 LAB — HM MAMMOGRAPHY: HM Mammogram: NORMAL (ref 0–4)

## 2018-11-26 ENCOUNTER — Encounter: Payer: Self-pay | Admitting: Internal Medicine

## 2018-11-26 DIAGNOSIS — M25511 Pain in right shoulder: Secondary | ICD-10-CM | POA: Diagnosis not present

## 2018-11-26 DIAGNOSIS — S46011D Strain of muscle(s) and tendon(s) of the rotator cuff of right shoulder, subsequent encounter: Secondary | ICD-10-CM | POA: Diagnosis not present

## 2018-11-26 DIAGNOSIS — M25611 Stiffness of right shoulder, not elsewhere classified: Secondary | ICD-10-CM | POA: Diagnosis not present

## 2018-11-26 DIAGNOSIS — M6281 Muscle weakness (generalized): Secondary | ICD-10-CM | POA: Diagnosis not present

## 2018-11-27 ENCOUNTER — Other Ambulatory Visit: Payer: Self-pay | Admitting: Internal Medicine

## 2018-11-28 DIAGNOSIS — S46011D Strain of muscle(s) and tendon(s) of the rotator cuff of right shoulder, subsequent encounter: Secondary | ICD-10-CM | POA: Diagnosis not present

## 2018-11-28 DIAGNOSIS — M6281 Muscle weakness (generalized): Secondary | ICD-10-CM | POA: Diagnosis not present

## 2018-11-28 DIAGNOSIS — M25511 Pain in right shoulder: Secondary | ICD-10-CM | POA: Diagnosis not present

## 2018-11-28 DIAGNOSIS — M25611 Stiffness of right shoulder, not elsewhere classified: Secondary | ICD-10-CM | POA: Diagnosis not present

## 2018-12-02 ENCOUNTER — Telehealth: Payer: Self-pay

## 2018-12-02 DIAGNOSIS — S46011D Strain of muscle(s) and tendon(s) of the rotator cuff of right shoulder, subsequent encounter: Secondary | ICD-10-CM | POA: Diagnosis not present

## 2018-12-02 DIAGNOSIS — M6281 Muscle weakness (generalized): Secondary | ICD-10-CM | POA: Diagnosis not present

## 2018-12-02 DIAGNOSIS — M25511 Pain in right shoulder: Secondary | ICD-10-CM | POA: Diagnosis not present

## 2018-12-02 DIAGNOSIS — M25611 Stiffness of right shoulder, not elsewhere classified: Secondary | ICD-10-CM | POA: Diagnosis not present

## 2018-12-02 NOTE — Telephone Encounter (Signed)
Refilled pt refill request for 16 days enough until pt upcoming appt, lvm to make pt aware and to remind her of her upcoming appt

## 2018-12-04 DIAGNOSIS — M25611 Stiffness of right shoulder, not elsewhere classified: Secondary | ICD-10-CM | POA: Diagnosis not present

## 2018-12-04 DIAGNOSIS — S46011D Strain of muscle(s) and tendon(s) of the rotator cuff of right shoulder, subsequent encounter: Secondary | ICD-10-CM | POA: Diagnosis not present

## 2018-12-04 DIAGNOSIS — M25511 Pain in right shoulder: Secondary | ICD-10-CM | POA: Diagnosis not present

## 2018-12-04 DIAGNOSIS — M6281 Muscle weakness (generalized): Secondary | ICD-10-CM | POA: Diagnosis not present

## 2018-12-10 DIAGNOSIS — M6281 Muscle weakness (generalized): Secondary | ICD-10-CM | POA: Diagnosis not present

## 2018-12-10 DIAGNOSIS — M25511 Pain in right shoulder: Secondary | ICD-10-CM | POA: Diagnosis not present

## 2018-12-10 DIAGNOSIS — S46011D Strain of muscle(s) and tendon(s) of the rotator cuff of right shoulder, subsequent encounter: Secondary | ICD-10-CM | POA: Diagnosis not present

## 2018-12-10 DIAGNOSIS — M25611 Stiffness of right shoulder, not elsewhere classified: Secondary | ICD-10-CM | POA: Diagnosis not present

## 2018-12-12 DIAGNOSIS — M6281 Muscle weakness (generalized): Secondary | ICD-10-CM | POA: Diagnosis not present

## 2018-12-12 DIAGNOSIS — S46011D Strain of muscle(s) and tendon(s) of the rotator cuff of right shoulder, subsequent encounter: Secondary | ICD-10-CM | POA: Diagnosis not present

## 2018-12-12 DIAGNOSIS — M25511 Pain in right shoulder: Secondary | ICD-10-CM | POA: Diagnosis not present

## 2018-12-12 DIAGNOSIS — M25611 Stiffness of right shoulder, not elsewhere classified: Secondary | ICD-10-CM | POA: Diagnosis not present

## 2018-12-16 ENCOUNTER — Ambulatory Visit: Payer: Medicare HMO

## 2018-12-16 ENCOUNTER — Ambulatory Visit (INDEPENDENT_AMBULATORY_CARE_PROVIDER_SITE_OTHER): Payer: Medicare HMO

## 2018-12-16 ENCOUNTER — Other Ambulatory Visit: Payer: Self-pay

## 2018-12-16 VITALS — Temp 97.3°F | Ht 61.0 in | Wt 165.0 lb

## 2018-12-16 DIAGNOSIS — M25511 Pain in right shoulder: Secondary | ICD-10-CM | POA: Diagnosis not present

## 2018-12-16 DIAGNOSIS — Z0000 Encounter for general adult medical examination without abnormal findings: Secondary | ICD-10-CM | POA: Diagnosis not present

## 2018-12-16 DIAGNOSIS — M6281 Muscle weakness (generalized): Secondary | ICD-10-CM | POA: Diagnosis not present

## 2018-12-16 DIAGNOSIS — M25611 Stiffness of right shoulder, not elsewhere classified: Secondary | ICD-10-CM | POA: Diagnosis not present

## 2018-12-16 DIAGNOSIS — S46011D Strain of muscle(s) and tendon(s) of the rotator cuff of right shoulder, subsequent encounter: Secondary | ICD-10-CM | POA: Diagnosis not present

## 2018-12-16 NOTE — Progress Notes (Signed)
Subjective:   Sara Obrien is a 73 y.o. female who presents for an Initial Medicare Annual Wellness Visit.  This visit type was conducted due to national recommendations for restrictions regarding the COVID-19 Pandemic (e.g. social distancing). This format is felt to be most appropriate for this patient at this time. All issues noted in this document were discussed and addressed. No physical exam was performed (except for noted visual exam findings with Video Visits). The patient,[name], has given consent to perform this visit via video. Vital signs may be absent or patient reported.   Patient location:  At home   Nurse location:  At home   Review of Systems    n/a  Cardiac Risk Factors include: advanced age (>20men, >62 women);obesity (BMI >30kg/m2)     Objective:    Today's Vitals   12/16/18 1447  Temp: (!) 97.3 F (36.3 C)  Weight: 165 lb (74.8 kg)  Height: 5\' 1"  (1.549 m)   Body mass index is 31.18 kg/m.  Advanced Directives 12/16/2018  Does Patient Have a Medical Advance Directive? No    Current Medications (verified) Outpatient Encounter Medications as of 12/16/2018  Medication Sig  . acetaminophen (TYLENOL) 500 MG tablet Take 500 mg by mouth every 6 (six) hours as needed.  [name] atorvastatin (LIPITOR) 20 MG tablet TAKE 1 TABLET(20 MG) BY MOUTH DAILY  . Cholecalciferol (D3 HIGH POTENCY) 2000 units CAPS Take 1 capsule by mouth daily.  [name] escitalopram (LEXAPRO) 20 MG tablet TAKE 1 TABLET BY MOUTH EVERY DAY  . levocetirizine (XYZAL) 5 MG tablet TAKE 1 TABLET(5 MG) BY MOUTH EVERY EVENING  . meloxicam (MOBIC) 15 MG tablet Take 1 tablet (15 mg total) by mouth daily.  [name] olmesartan-hydrochlorothiazide (BENICAR HCT) 40-12.5 MG tablet TAKE 1 TABLET BY MOUTH DAILY  . Omega-3 Fatty Acids (OMEGA 3 500 PO) Take by mouth.  . cyclobenzaprine (FLEXERIL) 5 MG tablet Take 1 tablet (5 mg total) by mouth at bedtime. (Patient not taking: Reported on 12/16/2018)  .  Docosahexaenoic Acid (DHA PO) Take by mouth.  [name] HYDROcodone-homatropine (HYDROMET) 5-1.5 MG/5ML syrup Take 5 mLs by mouth every 6 (six) hours as needed. (Patient not taking: Reported on 12/16/2018)  . SUMAtriptan (IMITREX) 25 MG tablet Take 25 mg by mouth as needed for migraine.   . traMADol (ULTRAM) 50 MG tablet Take 1 tablet (50 mg total) by mouth every 6 (six) hours as needed. (Patient not taking: Reported on 12/16/2018)   No facility-administered encounter medications on file as of 12/16/2018.     Allergies (verified) Patient has no known allergies.   History: Past Medical History:  Diagnosis Date  . Anxiety   . Atypical chest pain 07/01/2017  . Essential hypertension 07/01/2017  . Gum disease    dentures worn  . Hyperlipidemia   . Hypertension   . Migraines    Past Surgical History:  Procedure Laterality Date  . BREAST LUMPECTOMY    . TUBAL LIGATION     Family History  Problem Relation Age of Onset  . Brain cancer Mother   . Hypertension Mother   . Stroke Father   . Hypertension Father   . Hypertension Sister   . Cancer Brother   . Cancer Brother   . Cancer Brother   . Breast cancer Sister    Social History   Socioeconomic History  . Marital status: Married    Spouse name: Not on file  . Number of children: Not on file  . Years of  education: Not on file  . Highest education level: Not on file  Occupational History  . Occupation: semi- retired  Scientific laboratory technician  . Financial resource strain: Not hard at all  . Food insecurity    Worry: Never true    Inability: Never true  . Transportation needs    Medical: No    Non-medical: No  Tobacco Use  . Smoking status: Never Smoker  . Smokeless tobacco: Never Used  Substance and Sexual Activity  . Alcohol use: Yes    Comment: glass of wine  . Drug use: No  . Sexual activity: Yes    Birth control/protection: Abstinence  Lifestyle  . Physical activity    Days per week: 7 days    Minutes per session: Not on file  .  Stress: Not at all  Relationships  . Social Herbalist on phone: Not on file    Gets together: Not on file    Attends religious service: Not on file    Active member of club or organization: Not on file    Attends meetings of clubs or organizations: Not on file    Relationship status: Not on file  Other Topics Concern  . Not on file  Social History Narrative  . Not on file    Tobacco Counseling Counseling given: Not Answered   Clinical Intake:  Pre-visit preparation completed: Yes  Pain : No/denies pain     Nutritional Status: BMI > 30  Obese Nutritional Risks: None Diabetes: No            Activities of Daily Living In your present state of health, do you have any difficulty performing the following activities: 12/16/2018  Hearing? N  Vision? N  Difficulty concentrating or making decisions? N  Walking or climbing stairs? N  Dressing or bathing? N  Doing errands, shopping? N  Preparing Food and eating ? N  Using the Toilet? N  In the past six months, have you accidently leaked urine? N  Do you have problems with loss of bowel control? N  Managing your Medications? N  Managing your Finances? N  Housekeeping or managing your Housekeeping? N  Some recent data might be hidden     Immunizations and Health Maintenance Immunization History  Administered Date(s) Administered  . Zoster 07/01/2016   Health Maintenance Due  Topic Date Due  . INFLUENZA VACCINE  11/01/2018    Patient Care Team: [name], MD as PCP - General (Internal Medicine)  Indicate any recent Medical Services you may have received from other than [facility] providers in the past year (date may be approximate).     Assessment:   This is a routine wellness examination for [facility].  Hearing/Vision screen  Hearing Screening   125Hz  250Hz  500Hz  1000Hz  2000Hz  3000Hz  4000Hz  6000Hz  8000Hz   Right ear:           Left ear:           Vision Screening Comments: Annual eye exams   Dietary issues and exercise activities discussed: Current Exercise Habits: Structured exercise class(plays pickle ball 7 days a week), Frequency (Times/Week): 7  Goals    . Weight (lb) < 200 lb (90.7 kg)     12/16/2018, wants to weigh 140 pounds      Depression Screen PHQ 2/9 Scores 12/16/2018 05/08/2018 04/29/2018  PHQ - 2 Score 0 3 0  PHQ- 9 Score 0 6 -    Fall Risk Fall Risk  12/16/2018 05/08/2018  Falls  in the past year? 1 0  Comment 1 fall; while pickle ball -  Injury with Fall? 1 -  Comment injured right rotator cuff -  Risk for fall due to : History of fall(s) -  Follow up Falls evaluation completed;Falls prevention discussed -    Is the patient's home free of loose throw rugs in walkways, pet beds, electrical cords, etc?   yes      Grab bars in the bathroom? no      Handrails on the stairs?   yes      Adequate lighting?   yes  Timed Get Up and Go Performed n/a  Cognitive Function:     6CIT Screen 12/16/2018 12/16/2018  What Year? 0 points 0 points  What month? 0 points 0 points  What time? 0 points 0 points  Count back from 20 0 points 0 points  Months in reverse 0 points 0 points  Repeat phrase 0 points 0 points  Total Score 0 0    Screening Tests Health Maintenance  Topic Date Due  . INFLUENZA VACCINE  11/01/2018  . MAMMOGRAM  11/20/2020  . TETANUS/TDAP  09/23/2023  . COLONOSCOPY  04/13/2025  . DEXA SCAN  Completed  . Hepatitis C Screening  Completed  . PNA vac Low Risk Adult  Completed    Qualifies for Shingles Vaccine? yes  Cancer Screenings: Lung: Low Dose CT Chest recommended if Age 22-80 years, 30 pack-year currently smoking OR have quit w/in 15years. Patient does not qualify. Breast: Up to date on Mammogram? Yes   Up to date of Bone Density/Dexa? Yes Colorectal: up to date  Additional Screenings:  Hepatitis C Screening: 07/2012     Plan:    Patient wants to weigh 140 pounds.  I have personally reviewed and noted the following in the  patient's chart:   . Medical and social history . Use of alcohol, tobacco or illicit drugs  . Current medications and supplements . Functional ability and status . Nutritional status . Physical activity . Advanced directives . List of other physicians . Hospitalizations, surgeries, and ER visits in previous 12 months . Vitals . Screenings to include cognitive, depression, and falls . Referrals and appointments  In addition, I have reviewed and discussed with patient certain preventive protocols, quality metrics, and best practice recommendations. A written personalized care plan for preventive services as well as general preventive health recommendations were provided to patient.     Kellie Simmering, LPN   [phone]

## 2018-12-16 NOTE — Patient Instructions (Signed)
Sara Obrien , Thank you for taking time to come for your Medicare Wellness Visit. I appreciate your ongoing commitment to your health goals. Please review the following plan we discussed and let me know if I can assist you in the future.   Screening recommendations/referrals: Colonoscopy: 04/2015 Mammogram: 11/2018 Bone Density: 12/2017 Recommended yearly ophthalmology/optometry visit for glaucoma screening and checkup Recommended yearly dental visit for hygiene and checkup  Vaccinations: Influenza vaccine: 12/2017 Pneumococcal vaccine: 10/2017 Tdap vaccine: 08/2013 Shingles vaccine: discussed    Advanced directives: Advance directive discussed with you today. Even though you declined this today please call our office should you change your mind and we can give you the proper paperwork for you to fill out.   Conditions/risks identified: obesity  Next appointment: 12/17/2018 at 11:00   Preventive Care 73 Years and Older, Female Preventive care refers to lifestyle choices and visits with your health care provider that can promote health and wellness. What does preventive care include?  A yearly physical exam. This is also called an annual well check.  Dental exams once or twice a year.  Routine eye exams. Ask your health care provider how often you should have your eyes checked.  Personal lifestyle choices, including:  Daily care of your teeth and gums.  Regular physical activity.  Eating a healthy diet.  Avoiding tobacco and drug use.  Limiting alcohol use.  Practicing safe sex.  Taking low-dose aspirin every day.  Taking vitamin and mineral supplements as recommended by your health care provider. What happens during an annual well check? The services and screenings done by your health care provider during your annual well check will depend on your age, overall health, lifestyle risk factors, and family history of disease. Counseling  Your health care provider may ask you  questions about your:  Alcohol use.  Tobacco use.  Drug use.  Emotional well-being.  Home and relationship well-being.  Sexual activity.  Eating habits.  History of falls.  Memory and ability to understand (cognition).  Work and work Statistician.  Reproductive health. Screening  You may have the following tests or measurements:  Height, weight, and BMI.  Blood pressure.  Lipid and cholesterol levels. These may be checked every 5 years, or more frequently if you are over 76 years old.  Skin check.  Lung cancer screening. You may have this screening every year starting at age 2 if you have a 30-pack-year history of smoking and currently smoke or have quit within the past 15 years.  Fecal occult blood test (FOBT) of the stool. You may have this test every year starting at age 67.  Flexible sigmoidoscopy or colonoscopy. You may have a sigmoidoscopy every 5 years or a colonoscopy every 10 years starting at age 41.  Hepatitis C blood test.  Hepatitis B blood test.  Sexually transmitted disease (STD) testing.  Diabetes screening. This is done by checking your blood sugar (glucose) after you have not eaten for a while (fasting). You may have this done every 1-3 years.  Bone density scan. This is done to screen for osteoporosis. You may have this done starting at age 1.  Mammogram. This may be done every 1-2 years. Talk to your health care provider about how often you should have regular mammograms. Talk with your health care provider about your test results, treatment options, and if necessary, the need for more tests. Vaccines  Your health care provider may recommend certain vaccines, such as:  Influenza vaccine. This is recommended every  year.  Tetanus, diphtheria, and acellular pertussis (Tdap, Td) vaccine. You may need a Td booster every 10 years.  Zoster vaccine. You may need this after age 42.  Pneumococcal 13-valent conjugate (PCV13) vaccine. One dose is  recommended after age 19.  Pneumococcal polysaccharide (PPSV23) vaccine. One dose is recommended after age 72. Talk to your health care provider about which screenings and vaccines you need and how often you need them. This information is not intended to replace advice given to you by your health care provider. Make sure you discuss any questions you have with your health care provider. Document Released: 04/15/2015 Document Revised: 12/07/2015 Document Reviewed: 01/18/2015 Elsevier Interactive Patient Education  2017 [employer] Prevention in the Home Falls can cause injuries. They can happen to people of all ages. There are many things you can do to make your home safe and to help prevent falls. What can I do on the outside of my home?  Regularly fix the edges of walkways and driveways and fix any cracks.  Remove anything that might make you trip as you walk through a door, such as a raised step or threshold.  Trim any bushes or trees on the path to your home.  Use bright outdoor lighting.  Clear any walking paths of anything that might make someone trip, such as rocks or tools.  Regularly check to see if handrails are loose or broken. Make sure that both sides of any steps have handrails.  Any raised decks and porches should have guardrails on the edges.  Have any leaves, snow, or ice cleared regularly.  Use sand or salt on walking paths during winter.  Clean up any spills in your garage right away. This includes oil or grease spills. What can I do in the bathroom?  Use night lights.  Install grab bars by the toilet and in the tub and shower. Do not use towel bars as grab bars.  Use non-skid mats or decals in the tub or shower.  If you need to sit down in the shower, use a plastic, non-slip stool.  Keep the floor dry. Clean up any water that spills on the floor as soon as it happens.  Remove soap buildup in the tub or shower regularly.  Attach bath mats  securely with double-sided non-slip rug tape.  Do not have throw rugs and other things on the floor that can make you trip. What can I do in the bedroom?  Use night lights.  Make sure that you have a light by your bed that is easy to reach.  Do not use any sheets or blankets that are too big for your bed. They should not hang down onto the floor.  Have a firm chair that has side arms. You can use this for support while you get dressed.  Do not have throw rugs and other things on the floor that can make you trip. What can I do in the kitchen?  Clean up any spills right away.  Avoid walking on wet floors.  Keep items that you use a lot in easy-to-reach places.  If you need to reach something above you, use a strong step stool that has a grab bar.  Keep electrical cords out of the way.  Do not use floor polish or wax that makes floors slippery. If you must use wax, use non-skid floor wax.  Do not have throw rugs and other things on the floor that can make you trip. What can  I do with my stairs?  Do not leave any items on the stairs.  Make sure that there are handrails on both sides of the stairs and use them. Fix handrails that are broken or loose. Make sure that handrails are as long as the stairways.  Check any carpeting to make sure that it is firmly attached to the stairs. Fix any carpet that is loose or worn.  Avoid having throw rugs at the top or bottom of the stairs. If you do have throw rugs, attach them to the floor with carpet tape.  Make sure that you have a light switch at the top of the stairs and the bottom of the stairs. If you do not have them, ask someone to add them for you. What else can I do to help prevent falls?  Wear shoes that:  Do not have high heels.  Have rubber bottoms.  Are comfortable and fit you well.  Are closed at the toe. Do not wear sandals.  If you use a stepladder:  Make sure that it is fully opened. Do not climb a closed  stepladder.  Make sure that both sides of the stepladder are locked into place.  Ask someone to hold it for you, if possible.  Clearly mark and make sure that you can see:  Any grab bars or handrails.  First and last steps.  Where the edge of each step is.  Use tools that help you move around (mobility aids) if they are needed. These include:  Canes.  Walkers.  Scooters.  Crutches.  Turn on the lights when you go into a dark area. Replace any light bulbs as soon as they burn out.  Set up your furniture so you have a clear path. Avoid moving your furniture around.  If any of your floors are uneven, fix them.  If there are any pets around you, be aware of where they are.  Review your medicines with your doctor. Some medicines can make you feel dizzy. This can increase your chance of falling. Ask your doctor what other things that you can do to help prevent falls. This information is not intended to replace advice given to you by your health care provider. Make sure you discuss any questions you have with your health care provider. Document Released: 01/13/2009 Document Revised: 08/25/2015 Document Reviewed: 04/23/2014 Elsevier Interactive Patient Education  2017 [employer].

## 2018-12-17 ENCOUNTER — Ambulatory Visit: Payer: Medicare Other | Admitting: Internal Medicine

## 2018-12-17 ENCOUNTER — Ambulatory Visit (INDEPENDENT_AMBULATORY_CARE_PROVIDER_SITE_OTHER): Payer: Medicare HMO | Admitting: Internal Medicine

## 2018-12-17 ENCOUNTER — Ambulatory Visit: Payer: Medicare Other

## 2018-12-17 ENCOUNTER — Encounter: Payer: Self-pay | Admitting: Internal Medicine

## 2018-12-17 VITALS — BP 120/68 | HR 69 | Temp 98.4°F | Ht 59.8 in | Wt 166.4 lb

## 2018-12-17 DIAGNOSIS — Z0000 Encounter for general adult medical examination without abnormal findings: Secondary | ICD-10-CM

## 2018-12-17 DIAGNOSIS — R413 Other amnesia: Secondary | ICD-10-CM

## 2018-12-17 DIAGNOSIS — Z23 Encounter for immunization: Secondary | ICD-10-CM | POA: Diagnosis not present

## 2018-12-17 DIAGNOSIS — R7309 Other abnormal glucose: Secondary | ICD-10-CM

## 2018-12-17 DIAGNOSIS — I129 Hypertensive chronic kidney disease with stage 1 through stage 4 chronic kidney disease, or unspecified chronic kidney disease: Secondary | ICD-10-CM | POA: Diagnosis not present

## 2018-12-17 DIAGNOSIS — E6609 Other obesity due to excess calories: Secondary | ICD-10-CM | POA: Diagnosis not present

## 2018-12-17 DIAGNOSIS — Z6832 Body mass index (BMI) 32.0-32.9, adult: Secondary | ICD-10-CM | POA: Diagnosis not present

## 2018-12-17 LAB — POCT URINALYSIS DIPSTICK
Glucose, UA: NEGATIVE
Ketones, UA: NEGATIVE
Leukocytes, UA: NEGATIVE
Nitrite, UA: NEGATIVE
Protein, UA: NEGATIVE
Spec Grav, UA: 1.02 (ref 1.010–1.025)
Urobilinogen, UA: 0.2 E.U./dL
pH, UA: 5 (ref 5.0–8.0)

## 2018-12-17 LAB — POCT UA - MICROALBUMIN
Albumin/Creatinine Ratio, Urine, POC: 30
Creatinine, POC: 300 mg/dL
Microalbumin Ur, POC: 10 mg/L

## 2018-12-17 NOTE — Progress Notes (Signed)
Subjective:     Patient ID: Sara Obrien , female    DOB: January 13, 1946 , 73 y.o.   MRN: [id number]   Chief Complaint  Patient presents with  . Annual Exam  . Hypertension    HPI  She is here today for a full physical examination. She does not wish to have a pelvic exam. She has no specific concerns at this time.   Hypertension This is a chronic problem. The current episode started more than 1 year ago. The problem has been gradually improving since onset. The problem is controlled. Pertinent negatives include no blurred vision, chest pain, palpitations or shortness of breath. Past treatments include angiotensin blockers and diuretics. The current treatment provides moderate improvement.     Past Medical History:  Diagnosis Date  . Anxiety   . Atypical chest pain 07/01/2017  . Essential hypertension 07/01/2017  . Gum disease    dentures worn  . Hyperlipidemia   . Hypertension   . Migraines      Family History  Problem Relation Age of Onset  . Brain cancer Mother   . Hypertension Mother   . Stroke Father   . Hypertension Father   . Hypertension Sister   . Cancer Brother   . Cancer Brother   . Cancer Brother   . Breast cancer Sister      Current Outpatient Medications:  .  acetaminophen (TYLENOL) 500 MG tablet, Take 500 mg by mouth every 6 (six) hours as needed., Disp: , Rfl:  .  atorvastatin (LIPITOR) 20 MG tablet, TAKE 1 TABLET(20 MG) BY MOUTH DAILY, Disp: 16 tablet, Rfl: 0 .  Cholecalciferol (D3 HIGH POTENCY) 2000 units CAPS, Take 1 capsule by mouth daily., Disp: , Rfl:  .  Docosahexaenoic Acid (DHA PO), Take by mouth., Disp: , Rfl:  .  levocetirizine (XYZAL) 5 MG tablet, TAKE 1 TABLET(5 MG) BY MOUTH EVERY EVENING, Disp: 90 tablet, Rfl: 1 .  olmesartan-hydrochlorothiazide (BENICAR HCT) 40-12.5 MG tablet, TAKE 1 TABLET BY MOUTH DAILY, Disp: 90 tablet, Rfl: 0 .  Omega-3 Fatty Acids (OMEGA 3 500 PO), Take by mouth., Disp: , Rfl:  .  cyclobenzaprine (FLEXERIL) 5 MG  tablet, Take 1 tablet (5 mg total) by mouth at bedtime. (Patient not taking: Reported on 12/16/2018), Disp: 10 tablet, Rfl: 0 .  escitalopram (LEXAPRO) 20 MG tablet, TAKE 1 TABLET BY MOUTH EVERY DAY (Patient not taking: Reported on 12/17/2018), Disp: 90 tablet, Rfl: 0 .  HYDROcodone-homatropine (HYDROMET) 5-1.5 MG/5ML syrup, Take 5 mLs by mouth every 6 (six) hours as needed. (Patient not taking: Reported on 12/16/2018), Disp: 120 mL, Rfl: 0 .  meloxicam (MOBIC) 15 MG tablet, Take 1 tablet (15 mg total) by mouth daily. (Patient not taking: Reported on 12/17/2018), Disp: 20 tablet, Rfl: 0 .  SUMAtriptan (IMITREX) 25 MG tablet, Take 25 mg by mouth as needed for migraine. , Disp: , Rfl:  .  traMADol (ULTRAM) 50 MG tablet, Take 1 tablet (50 mg total) by mouth every 6 (six) hours as needed. (Patient not taking: Reported on 12/16/2018), Disp: 15 tablet, Rfl: 0   No Known Allergies   Review of Systems  Constitutional: Negative.   HENT: Negative.   Eyes: Negative.  Negative for blurred vision.  Respiratory: Negative.  Negative for shortness of breath.   Cardiovascular: Negative.  Negative for chest pain and palpitations.  Endocrine: Negative.   Genitourinary: Negative.   Musculoskeletal: Negative.   Skin: Negative.   Allergic/Immunologic: Negative.   Neurological: Negative.  She c/o changes in her memory. She feels her memory is declining. She had virtual AWV yesterday, states she passed her "test".   Hematological: Negative.   Psychiatric/Behavioral: Negative.      Today's Vitals   12/17/18 1105  BP: 120/68  Pulse: 69  Temp: 98.4 F (36.9 C)  TempSrc: Oral  SpO2: 97%  Weight: 166 lb 6.4 oz (75.5 kg)  Height: 4' 11.8" (1.519 m)   Body mass index is 32.72 kg/m.   Objective:  Physical Exam Vitals signs and nursing note reviewed.  Constitutional:      Appearance: Normal appearance. She is obese.  HENT:     Head: Normocephalic and atraumatic.     Right Ear: Tympanic membrane, ear  canal and external ear normal.     Left Ear: Tympanic membrane, ear canal and external ear normal.     Nose: Nose normal.     Mouth/Throat:     Mouth: Mucous membranes are moist.     Pharynx: Oropharynx is clear.  Eyes:     Extraocular Movements: Extraocular movements intact.     Conjunctiva/sclera: Conjunctivae normal.     Pupils: Pupils are equal, round, and reactive to light.  Neck:     Musculoskeletal: Normal range of motion and neck supple.  Cardiovascular:     Rate and Rhythm: Normal rate and regular rhythm.     Pulses: Normal pulses.     Heart sounds: Normal heart sounds.  Pulmonary:     Effort: Pulmonary effort is normal.     Breath sounds: Normal breath sounds.  Chest:     Breasts: Tanner Score is 5.        Right: No swelling, bleeding, inverted nipple, mass, nipple discharge or skin change.        Left: Normal. No swelling, bleeding, inverted nipple, mass, nipple discharge or skin change.  Abdominal:     General: Bowel sounds are normal.     Palpations: Abdomen is soft.     Comments: Rounded, no organomegaly identified  Genitourinary:    Comments: deferred Musculoskeletal: Normal range of motion.  Skin:    General: Skin is warm and dry.  Neurological:     General: No focal deficit present.     Mental Status: She is alert and oriented to person, place, and time.  Psychiatric:        Mood and Affect: Mood normal.        Behavior: Behavior normal.         Assessment And Plan:     1. Encounter for annual physical exam  A full exam was performed.  Importance of monthly self breast exams was discussed with the patient. PATIENT HAS BEEN ADVISED TO GET 30-45 MINUTES REGULAR EXERCISE NO LESS THAN FOUR TO FIVE DAYS PER WEEK - BOTH WEIGHTBEARING EXERCISES AND AEROBIC ARE RECOMMENDED.  SHE WAS ADVISED TO FOLLOW A HEALTHY DIET WITH AT LEAST SIX FRUITS/VEGGIES PER DAY, DECREASE INTAKE OF RED MEAT, AND TO INCREASE FISH INTAKE TO TWO DAYS PER WEEK.  MEATS/FISH SHOULD NOT BE  FRIED, BAKED OR BROILED IS PREFERABLE.  I SUGGEST WEARING SPF 50 SUNSCREEN ON EXPOSED PARTS AND ESPECIALLY WHEN IN THE DIRECT SUNLIGHT FOR AN EXTENDED PERIOD OF TIME.  PLEASE AVOID FAST FOOD RESTAURANTS AND INCREASE YOUR WATER INTAKE.  - POCT Urinalysis Dipstick (81002) - POCT UA - Microalbumin - EKG 12-Lead  2. Hypertensive nephropathy  Chronic, well controlled. She will continue with current meds. EKG performed, no new changes noted. She will  rto in six months for re-evaluation. She is encouraged to avoid adding salt to her foods. She is also encouraged to continue with her regular exercise regimen.   - POCT Urinalysis Dipstick (81002) - POCT UA - Microalbumin - EKG 12-Lead - CMP14+EGFR - CBC - Lipid panel  3. Other abnormal glucose  HER A1C HAS BEEN ELEVATED IN THE PAST. I WILL CHECK AN A1C, BMET TODAY. SHE WAS ENCOURAGED TO AVOID SUGARY BEVERAGES AND PROCESSED FOODS INCLUDNG BREADS, RICE AND PASTA.  - Hemoglobin A1c  4. Memory changes  I will check labs as listed below. I will make additional recommendations once her labs are available for review.   - Vitamin B12 - TSH  5. Class 1 obesity due to excess calories with serious comorbidity and body mass index (BMI) of 32.0 to 32.9 in adult   She is encouraged to strive for BMI less than 29 to decrease cardiac risk. She is encouraged to avoid processed foods and sugary beverages.   6. Need for influenza vaccination  - Flu vaccine HIGH DOSE PF (Fluzone High dose)        [name], MD    THE PATIENT IS ENCOURAGED TO PRACTICE SOCIAL DISTANCING DUE TO THE COVID-19 PANDEMIC.

## 2018-12-17 NOTE — Patient Instructions (Signed)
Health Maintenance, Female Adopting a healthy lifestyle and getting preventive care are important in promoting health and wellness. Ask your health care provider about:  The right schedule for you to have regular tests and exams.  Things you can do on your own to prevent diseases and keep yourself healthy. What should I know about diet, weight, and exercise? Eat a healthy diet   Eat a diet that includes plenty of vegetables, fruits, low-fat dairy products, and lean protein.  Do not eat a lot of foods that are high in solid fats, added sugars, or sodium. Maintain a healthy weight Body mass index (BMI) is used to identify weight problems. It estimates body fat based on height and weight. Your health care provider can help determine your BMI and help you achieve or maintain a healthy weight. Get regular exercise Get regular exercise. This is one of the most important things you can do for your health. Most adults should:  Exercise for at least 150 minutes each week. The exercise should increase your heart rate and make you sweat (moderate-intensity exercise).  Do strengthening exercises at least twice a week. This is in addition to the moderate-intensity exercise.  Spend less time sitting. Even light physical activity can be beneficial. Watch cholesterol and blood lipids Have your blood tested for lipids and cholesterol at 73 years of age, then have this test every 5 years. Have your cholesterol levels checked more often if:  Your lipid or cholesterol levels are high.  You are older than 73 years of age.  You are at high risk for heart disease. What should I know about cancer screening? Depending on your health history and family history, you may need to have cancer screening at various ages. This may include screening for:  Breast cancer.  Cervical cancer.  Colorectal cancer.  Skin cancer.  Lung cancer. What should I know about heart disease, diabetes, and high blood  pressure? Blood pressure and heart disease  High blood pressure causes heart disease and increases the risk of stroke. This is more likely to develop in people who have high blood pressure readings, are of African descent, or are overweight.  Have your blood pressure checked: ? Every 3-5 years if you are 18-39 years of age. ? Every year if you are 40 years old or older. Diabetes Have regular diabetes screenings. This checks your fasting blood sugar level. Have the screening done:  Once every three years after age 40 if you are at a normal weight and have a low risk for diabetes.  More often and at a younger age if you are overweight or have a high risk for diabetes. What should I know about preventing infection? Hepatitis B If you have a higher risk for hepatitis B, you should be screened for this virus. Talk with your health care provider to find out if you are at risk for hepatitis B infection. Hepatitis C Testing is recommended for:  Everyone born from 1945 through 1965.  Anyone with known risk factors for hepatitis C. Sexually transmitted infections (STIs)  Get screened for STIs, including gonorrhea and chlamydia, if: ? You are sexually active and are younger than 73 years of age. ? You are older than 73 years of age and your health care provider tells you that you are at risk for this type of infection. ? Your sexual activity has changed since you were last screened, and you are at increased risk for chlamydia or gonorrhea. Ask your health care provider if   you are at risk.  Ask your health care provider about whether you are at high risk for HIV. Your health care provider may recommend a prescription medicine to help prevent HIV infection. If you choose to take medicine to prevent HIV, you should first get tested for HIV. You should then be tested every 3 months for as long as you are taking the medicine. Pregnancy  If you are about to stop having your period (premenopausal) and  you may become pregnant, seek counseling before you get pregnant.  Take 400 to 800 micrograms (mcg) of folic acid every day if you become pregnant.  Ask for birth control (contraception) if you want to prevent pregnancy. Osteoporosis and menopause Osteoporosis is a disease in which the bones lose minerals and strength with aging. This can result in bone fractures. If you are 65 years old or older, or if you are at risk for osteoporosis and fractures, ask your health care provider if you should:  Be screened for bone loss.  Take a calcium or vitamin D supplement to lower your risk of fractures.  Be given hormone replacement therapy (HRT) to treat symptoms of menopause. Follow these instructions at home: Lifestyle  Do not use any products that contain nicotine or tobacco, such as cigarettes, e-cigarettes, and chewing tobacco. If you need help quitting, ask your health care provider.  Do not use street drugs.  Do not share needles.  Ask your health care provider for help if you need support or information about quitting drugs. Alcohol use  Do not drink alcohol if: ? Your health care provider tells you not to drink. ? You are pregnant, may be pregnant, or are planning to become pregnant.  If you drink alcohol: ? Limit how much you use to 0-1 drink a day. ? Limit intake if you are breastfeeding.  Be aware of how much alcohol is in your drink. In the [place] one drink equals one 12 oz bottle of beer (355 mL), one 5 oz glass of wine (148 mL), or one 1 oz glass of hard liquor (44 mL). General instructions  Schedule regular health, dental, and eye exams.  Stay current with your vaccines.  Tell your health care provider if: ? You often feel depressed. ? You have ever been abused or do not feel safe at home. Summary  Adopting a healthy lifestyle and getting preventive care are important in promoting health and wellness.  Follow your health care provider's instructions about healthy  diet, exercising, and getting tested or screened for diseases.  Follow your health care provider's instructions on monitoring your cholesterol and blood pressure. This information is not intended to replace advice given to you by your health care provider. Make sure you discuss any questions you have with your health care provider. Document Released: 10/02/2010 Document Revised: 03/12/2018 Document Reviewed: 03/12/2018 Elsevier Patient Education  2020 [employer]  

## 2018-12-18 LAB — CMP14+EGFR
ALT: 16 IU/L (ref 0–32)
AST: 25 IU/L (ref 0–40)
Albumin/Globulin Ratio: 1.2 (ref 1.2–2.2)
Albumin: 4.4 g/dL (ref 3.7–4.7)
Alkaline Phosphatase: 73 IU/L (ref 39–117)
BUN/Creatinine Ratio: 15 (ref 12–28)
BUN: 15 mg/dL (ref 8–27)
Bilirubin Total: 0.6 mg/dL (ref 0.0–1.2)
CO2: 23 mmol/L (ref 20–29)
Calcium: 10.1 mg/dL (ref 8.7–10.3)
Chloride: 96 mmol/L (ref 96–106)
Creatinine, Ser: 0.98 mg/dL (ref 0.57–1.00)
GFR calc Af Amer: 66 mL/min/{1.73_m2} (ref >59)
GFR calc non Af Amer: 57 mL/min/{1.73_m2} — ABNORMAL LOW (ref >59)
Globulin, Total: 3.6 g/dL (ref 1.5–4.5)
Glucose: 80 mg/dL (ref 65–99)
Potassium: 4.2 mmol/L (ref 3.5–5.2)
Sodium: 136 mmol/L (ref 134–144)
Total Protein: 8 g/dL (ref 6.0–8.5)

## 2018-12-18 LAB — CBC
Hematocrit: 39.7 % (ref 34.0–46.6)
Hemoglobin: 13.5 g/dL (ref 11.1–15.9)
MCH: 29.3 pg (ref 26.6–33.0)
MCHC: 34 g/dL (ref 31.5–35.7)
MCV: 86 fL (ref 79–97)
Platelets: 431 10*3/uL (ref 150–450)
RBC: 4.6 x10E6/uL (ref 3.77–5.28)
RDW: 14.3 % (ref 11.7–15.4)
WBC: 6.5 10*3/uL (ref 3.4–10.8)

## 2018-12-18 LAB — VITAMIN B12: Vitamin B-12: 556 pg/mL (ref 232–1245)

## 2018-12-18 LAB — HEMOGLOBIN A1C
Est. average glucose Bld gHb Est-mCnc: 114 mg/dL
Hgb A1c MFr Bld: 5.6 % (ref 4.8–5.6)

## 2018-12-18 LAB — LIPID PANEL
Chol/HDL Ratio: 3.6 ratio (ref 0.0–4.4)
Cholesterol, Total: 158 mg/dL (ref 100–199)
HDL: 44 mg/dL (ref >39)
LDL Chol Calc (NIH): 96 mg/dL (ref 0–99)
Triglycerides: 99 mg/dL (ref 0–149)
VLDL Cholesterol Cal: 18 mg/dL (ref 5–40)

## 2018-12-18 LAB — TSH: TSH: 0.786 u[IU]/mL (ref 0.450–4.500)

## 2018-12-23 DIAGNOSIS — S46011D Strain of muscle(s) and tendon(s) of the rotator cuff of right shoulder, subsequent encounter: Secondary | ICD-10-CM | POA: Diagnosis not present

## 2018-12-23 DIAGNOSIS — M25611 Stiffness of right shoulder, not elsewhere classified: Secondary | ICD-10-CM | POA: Diagnosis not present

## 2018-12-23 DIAGNOSIS — M25511 Pain in right shoulder: Secondary | ICD-10-CM | POA: Diagnosis not present

## 2018-12-23 DIAGNOSIS — M6281 Muscle weakness (generalized): Secondary | ICD-10-CM | POA: Diagnosis not present

## 2019-01-19 ENCOUNTER — Other Ambulatory Visit: Payer: Self-pay

## 2019-01-19 MED ORDER — OLMESARTAN MEDOXOMIL-HCTZ 40-12.5 MG PO TABS: 1.0000 | ORAL_TABLET | Freq: Every day | ORAL | 1 refills | Status: DC

## 2019-01-26 ENCOUNTER — Ambulatory Visit (INDEPENDENT_AMBULATORY_CARE_PROVIDER_SITE_OTHER): Payer: Medicare HMO | Admitting: Nurse Practitioner

## 2019-01-26 ENCOUNTER — Other Ambulatory Visit: Payer: Self-pay

## 2019-01-26 ENCOUNTER — Encounter: Payer: Self-pay | Admitting: Nurse Practitioner

## 2019-01-26 VITALS — BP 112/72 | HR 63 | Temp 98.3°F | Ht 59.8 in | Wt 169.8 lb

## 2019-01-26 DIAGNOSIS — M25562 Pain in left knee: Secondary | ICD-10-CM

## 2019-01-26 MED ORDER — ATORVASTATIN CALCIUM 20 MG PO TABS: ORAL_TABLET | ORAL | 1 refills | Status: DC

## 2019-01-26 MED ORDER — KETOROLAC TROMETHAMINE 60 MG/2ML IM SOLN
60.0000 mg | Freq: Once | INTRAMUSCULAR | Status: AC
  Administered 2019-01-26: 17:00:00 60 mg via INTRAMUSCULAR

## 2019-01-26 NOTE — Progress Notes (Signed)
Subjective:     Patient ID: Sara Obrien , female    DOB: [birth year] , 73 y.o.   MRN: [id number]   Chief Complaint  Patient presents with  . Referral    patient needs a referral to rheumotologist. patient stated sheis having pain in her knee    HPI  She would like a referral to the Rheumatologist. 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 She has been playing "pickle ball" now has to stop playing due to pain and needs help getting off the court.  Has been playing for over a year.  She had pain to left part of her thigh like a burning sensation below her groin.  Also down to left ankle, she is taking aleve without relief.  Using a heating pad with vibration.  She has not seen an orthopedic in the past, she did have a fall a few years ago.      Past Medical History:  Diagnosis Date  . Anxiety   . Atypical chest pain 07/01/2017  . Essential hypertension 07/01/2017  . Gum disease    dentures worn  . Hyperlipidemia   . Hypertension   . Migraines      Family History  Problem Relation Age of Onset  . Brain cancer Mother   . Hypertension Mother   . Stroke Father   . Hypertension Father   . Hypertension Sister   . Cancer Brother   . Cancer Brother   . Cancer Brother   . Breast cancer Sister      Current Outpatient Medications:  .  acetaminophen (TYLENOL) 500 MG tablet, Take 500 mg by mouth every 6 (six) hours as needed., Disp: , Rfl:  .  Cholecalciferol (D3 HIGH POTENCY) 2000 units CAPS, Take 1 capsule by mouth daily., Disp: , Rfl:  .  escitalopram (LEXAPRO) 20 MG tablet, TAKE 1 TABLET BY MOUTH EVERY DAY, Disp: 90 tablet, Rfl: 0 .  meloxicam (MOBIC) 15 MG tablet, Take 1 tablet (15 mg total) by mouth daily., Disp: 20 tablet, Rfl: 0 .  olmesartan-hydrochlorothiazide (BENICAR HCT) 40-12.5 MG tablet, Take 1 tablet by mouth daily., Disp: 90 tablet, Rfl: 1 .  Omega-3 Fatty Acids (OMEGA 3 500 PO), Take by mouth., Disp: , Rfl:  .  atorvastatin (LIPITOR) 20  MG tablet, TAKE 1 TABLET(20 MG) BY MOUTH DAILY, Disp: 90 tablet, Rfl: 1 .  cyclobenzaprine (FLEXERIL) 5 MG tablet, Take 1 tablet (5 mg total) by mouth at bedtime. (Patient not taking: Reported on 12/16/2018), Disp: 10 tablet, Rfl: 0 .  Docosahexaenoic Acid (DHA PO), Take by mouth., Disp: , Rfl:  .  HYDROcodone-homatropine (HYDROMET) 5-1.5 MG/5ML syrup, Take 5 mLs by mouth every 6 (six) hours as needed. (Patient not taking: Reported on 12/16/2018), Disp: 120 mL, Rfl: 0 .  levocetirizine (XYZAL) 5 MG tablet, TAKE 1 TABLET(5 MG) BY MOUTH EVERY EVENING (Patient not taking: Reported on 01/26/2019), Disp: 90 tablet, Rfl: 1 .  SUMAtriptan (IMITREX) 25 MG tablet, Take 25 mg by mouth as needed for migraine. , Disp: , Rfl:  .  traMADol (ULTRAM) 50 MG tablet, Take 1 tablet (50 mg total) by mouth every 6 (six) hours as needed. (Patient not taking: Reported on 12/16/2018), Disp: 15 tablet, Rfl: 0   No Known Allergies   Review of Systems  Constitutional: Negative.   Respiratory: Negative.   Cardiovascular: Negative.  Negative for chest pain, palpitations and leg swelling.  Musculoskeletal: Positive for arthralgias (left knee radiating up  to groin and down to ankle).       Left knee pain.    Skin: Negative.   Neurological: Negative for dizziness and headaches.  Psychiatric/Behavioral: Negative.      Today's Vitals   01/26/19 1542  BP: 112/72  Pulse: 63  Temp: 98.3 F (36.8 C)  TempSrc: Oral  Weight: 169 lb 12.8 oz (77 kg)  Height: 4' 11.8" (1.519 m)  PainSc: 4   PainLoc: Knee   Body mass index is 33.38 kg/m.   Objective:  Physical Exam Constitutional:      General: She is not in acute distress.    Appearance: Normal appearance. She is obese.  Cardiovascular:     Rate and Rhythm: Normal rate and regular rhythm.     Pulses: Normal pulses.     Heart sounds: Normal heart sounds. No murmur.  Pulmonary:     Effort: Pulmonary effort is normal.     Breath sounds: Normal breath sounds.   Musculoskeletal:        General: Tenderness (mild tenderness to left lateral thigh) present. No deformity or signs of injury.     Right lower leg: No edema.     Left lower leg: No edema.  Skin:    General: Skin is warm and dry.     Capillary Refill: Capillary refill takes less than 2 seconds.  Neurological:     General: No focal deficit present.     Mental Status: She is alert and oriented to person, place, and time.         Assessment And Plan:     1. Acute pain of left knee  toradol 60 mg IM given in office   Will obtain knee xray and refer to orthopedic before sending to rheumatology  If we need to refer to rheumatology will likely need to obtain labs specific to inflammation - Ambulatory referral to Orthopedic Surgery - DG Knee Complete 4 Views Left; Future - ketorolac (TORADOL) injection 60 mg   Minette Brine, FNP    THE PATIENT IS ENCOURAGED TO PRACTICE SOCIAL DISTANCING DUE TO THE COVID-19 PANDEMIC.

## 2019-01-27 ENCOUNTER — Ambulatory Visit
Admission: RE | Admit: 2019-01-27 | Discharge: 2019-01-27 | Disposition: A | Discharge status: Home / Self Care | Payer: Medicare HMO | Source: Ambulatory Visit | Attending: Nurse Practitioner | Admitting: Nurse Practitioner

## 2019-01-27 DIAGNOSIS — M25562 Pain in left knee: Secondary | ICD-10-CM | POA: Diagnosis not present

## 2019-01-29 DIAGNOSIS — M5416 Radiculopathy, lumbar region: Secondary | ICD-10-CM | POA: Diagnosis not present

## 2019-01-29 DIAGNOSIS — M25552 Pain in left hip: Secondary | ICD-10-CM | POA: Diagnosis not present

## 2019-03-12 DIAGNOSIS — M1612 Unilateral primary osteoarthritis, left hip: Secondary | ICD-10-CM | POA: Diagnosis not present

## 2019-03-20 ENCOUNTER — Ambulatory Visit: Payer: Medicare HMO | Attending: Internal Medicine

## 2019-03-20 DIAGNOSIS — Z20822 Contact with and (suspected) exposure to covid-19: Secondary | ICD-10-CM

## 2019-03-20 DIAGNOSIS — Z20828 Contact with and (suspected) exposure to other viral communicable diseases: Secondary | ICD-10-CM | POA: Diagnosis not present

## 2019-03-21 LAB — NOVEL CORONAVIRUS, NAA: SARS-CoV-2, NAA: NOT DETECTED

## 2019-04-06 ENCOUNTER — Telehealth: Payer: Self-pay

## 2019-04-06 NOTE — Telephone Encounter (Signed)
Spoke with pt about requested appt. Pt had not first called ortho for a followup visit. Pt will followup with ortho Noemi Chapel. If pt is not satisfied she will call us back. Pt agrees.

## 2019-04-07 DIAGNOSIS — M79652 Pain in left thigh: Secondary | ICD-10-CM | POA: Diagnosis not present

## 2019-04-16 DIAGNOSIS — M545 Low back pain: Secondary | ICD-10-CM | POA: Diagnosis not present

## 2019-04-18 ENCOUNTER — Other Ambulatory Visit: Payer: Self-pay | Admitting: Internal Medicine

## 2019-04-23 DIAGNOSIS — M5416 Radiculopathy, lumbar region: Secondary | ICD-10-CM | POA: Diagnosis not present

## 2019-04-23 DIAGNOSIS — M545 Low back pain: Secondary | ICD-10-CM | POA: Diagnosis not present

## 2019-04-24 DIAGNOSIS — M5416 Radiculopathy, lumbar region: Secondary | ICD-10-CM | POA: Diagnosis not present

## 2019-04-24 DIAGNOSIS — M545 Low back pain: Secondary | ICD-10-CM | POA: Diagnosis not present

## 2019-04-29 ENCOUNTER — Ambulatory Visit: Payer: Medicare HMO

## 2019-04-29 ENCOUNTER — Ambulatory Visit: Payer: Self-pay

## 2019-04-30 DIAGNOSIS — M6281 Muscle weakness (generalized): Secondary | ICD-10-CM | POA: Diagnosis not present

## 2019-04-30 DIAGNOSIS — M5136 Other intervertebral disc degeneration, lumbar region: Secondary | ICD-10-CM | POA: Diagnosis not present

## 2019-04-30 DIAGNOSIS — M5416 Radiculopathy, lumbar region: Secondary | ICD-10-CM | POA: Diagnosis not present

## 2019-04-30 DIAGNOSIS — M545 Low back pain: Secondary | ICD-10-CM | POA: Diagnosis not present

## 2019-05-03 ENCOUNTER — Ambulatory Visit: Payer: Medicare HMO

## 2019-05-07 DIAGNOSIS — M5416 Radiculopathy, lumbar region: Secondary | ICD-10-CM | POA: Diagnosis not present

## 2019-05-07 DIAGNOSIS — M545 Low back pain: Secondary | ICD-10-CM | POA: Diagnosis not present

## 2019-05-07 DIAGNOSIS — M5136 Other intervertebral disc degeneration, lumbar region: Secondary | ICD-10-CM | POA: Diagnosis not present

## 2019-05-07 DIAGNOSIS — M6281 Muscle weakness (generalized): Secondary | ICD-10-CM | POA: Diagnosis not present

## 2019-05-08 ENCOUNTER — Ambulatory Visit: Payer: Medicare HMO | Attending: Internal Medicine

## 2019-05-08 DIAGNOSIS — Z23 Encounter for immunization: Secondary | ICD-10-CM | POA: Insufficient documentation

## 2019-05-08 NOTE — Progress Notes (Signed)
   Covid-19 Vaccination Clinic  Name:  Sara Obrien    MRN: [id number] DOB: 1945-12-31  05/08/2019  [name] was observed post Covid-19 immunization for 15 minutes without incidence. She was provided with Vaccine Information Sheet and instruction to access the V-Safe system.   [name] was instructed to call 911 with any severe reactions post vaccine: [name] Difficulty breathing  . Swelling of your face and throat  . A fast heartbeat  . A bad rash all over your body  . Dizziness and weakness    Immunizations Administered    Name Date Dose VIS Date Route   Pfizer COVID-19 Vaccine 05/08/2019  9:56 AM 0.3 mL 03/13/2019 Intramuscular   Manufacturer: [place]   Lot: [id number]   [place]: [id number]

## 2019-05-12 DIAGNOSIS — M5416 Radiculopathy, lumbar region: Secondary | ICD-10-CM | POA: Diagnosis not present

## 2019-05-12 DIAGNOSIS — M545 Low back pain: Secondary | ICD-10-CM | POA: Diagnosis not present

## 2019-05-12 DIAGNOSIS — M5136 Other intervertebral disc degeneration, lumbar region: Secondary | ICD-10-CM | POA: Diagnosis not present

## 2019-05-12 DIAGNOSIS — M6281 Muscle weakness (generalized): Secondary | ICD-10-CM | POA: Diagnosis not present

## 2019-05-14 ENCOUNTER — Ambulatory Visit: Payer: Medicare HMO

## 2019-05-15 ENCOUNTER — Ambulatory Visit: Payer: Medicare HMO

## 2019-05-26 DIAGNOSIS — M5136 Other intervertebral disc degeneration, lumbar region: Secondary | ICD-10-CM | POA: Diagnosis not present

## 2019-05-26 DIAGNOSIS — M545 Low back pain: Secondary | ICD-10-CM | POA: Diagnosis not present

## 2019-05-26 DIAGNOSIS — M6281 Muscle weakness (generalized): Secondary | ICD-10-CM | POA: Diagnosis not present

## 2019-05-26 DIAGNOSIS — M5416 Radiculopathy, lumbar region: Secondary | ICD-10-CM | POA: Diagnosis not present

## 2019-06-02 ENCOUNTER — Ambulatory Visit: Payer: Medicare HMO | Attending: Internal Medicine

## 2019-06-02 DIAGNOSIS — Z23 Encounter for immunization: Secondary | ICD-10-CM

## 2019-06-02 NOTE — Progress Notes (Signed)
   Covid-19 Vaccination Clinic  Name:  Sara Obrien    MRN: [id number] DOB: 1945-11-19  06/02/2019  [name] was observed post Covid-19 immunization for 15 minutes without incident. She was provided with Vaccine Information Sheet and instruction to access the V-Safe system.   [name] was instructed to call 911 with any severe reactions post vaccine: [name] Difficulty breathing  . Swelling of face and throat  . A fast heartbeat  . A bad rash all over body  . Dizziness and weakness   Immunizations Administered    Name Date Dose VIS Date Route   Pfizer COVID-19 Vaccine 06/02/2019 10:07 AM 0.3 mL 03/13/2019 Intramuscular   Manufacturer: [place]   Lot: [id number]   [place]: [id number]

## 2019-06-16 ENCOUNTER — Ambulatory Visit (INDEPENDENT_AMBULATORY_CARE_PROVIDER_SITE_OTHER): Payer: Medicare HMO | Admitting: Internal Medicine

## 2019-06-16 ENCOUNTER — Encounter: Payer: Self-pay | Admitting: Internal Medicine

## 2019-06-16 ENCOUNTER — Other Ambulatory Visit: Payer: Self-pay

## 2019-06-16 VITALS — BP 116/82 | HR 65 | Temp 98.8°F | Ht 59.8 in | Wt 168.8 lb

## 2019-06-16 DIAGNOSIS — Z6833 Body mass index (BMI) 33.0-33.9, adult: Secondary | ICD-10-CM | POA: Diagnosis not present

## 2019-06-16 DIAGNOSIS — I129 Hypertensive chronic kidney disease with stage 1 through stage 4 chronic kidney disease, or unspecified chronic kidney disease: Secondary | ICD-10-CM | POA: Diagnosis not present

## 2019-06-16 DIAGNOSIS — E7800 Pure hypercholesterolemia, unspecified: Secondary | ICD-10-CM

## 2019-06-16 DIAGNOSIS — E6609 Other obesity due to excess calories: Secondary | ICD-10-CM | POA: Diagnosis not present

## 2019-06-16 DIAGNOSIS — H6123 Impacted cerumen, bilateral: Secondary | ICD-10-CM

## 2019-06-16 DIAGNOSIS — N182 Chronic kidney disease, stage 2 (mild): Secondary | ICD-10-CM

## 2019-06-16 NOTE — Patient Instructions (Signed)
Exercising to Stay Healthy To become healthy and stay healthy, it is recommended that you do moderate-intensity and vigorous-intensity exercise. You can tell that you are exercising at a moderate intensity if your heart starts beating faster and you start breathing faster but can still hold a conversation. You can tell that you are exercising at a vigorous intensity if you are breathing much harder and faster and cannot hold a conversation while exercising. Exercising regularly is important. It has many health benefits, such as:  Improving overall fitness, flexibility, and endurance.  Increasing bone density.  Helping with weight control.  Decreasing body fat.  Increasing muscle strength.  Reducing stress and tension.  Improving overall health. How often should I exercise? Choose an activity that you enjoy, and set realistic goals. Your health care provider can help you make an activity plan that works for you. Exercise regularly as told by your health care provider. This may include:  Doing strength training two times a week, such as: ? Lifting weights. ? Using resistance bands. ? Push-ups. ? Sit-ups. ? Yoga.  Doing a certain intensity of exercise for a given amount of time. Choose from these options: ? A total of 150 minutes of moderate-intensity exercise every week. ? A total of 75 minutes of vigorous-intensity exercise every week. ? A mix of moderate-intensity and vigorous-intensity exercise every week. Children, pregnant women, people who have not exercised regularly, people who are overweight, and older adults may need to talk with a health care provider about what activities are safe to do. If you have a medical condition, be sure to talk with your health care provider before you start a new exercise program. What are some exercise ideas? Moderate-intensity exercise ideas include:  Walking 1 mile (1.6 km) in about 15  minutes.  Biking.  Hiking.  Golfing.  Dancing.  Water aerobics. Vigorous-intensity exercise ideas include:  Walking 4.5 miles (7.2 km) or more in about 1 hour.  Jogging or running 5 miles (8 km) in about 1 hour.  Biking 10 miles (16.1 km) or more in about 1 hour.  Lap swimming.  Roller-skating or in-line skating.  Cross-country skiing.  Vigorous competitive sports, such as football, basketball, and soccer.  Jumping rope.  Aerobic dancing. What are some everyday activities that can help me to get exercise?  Yard work, such as: ? Pushing a lawn mower. ? Raking and bagging leaves.  Washing your car.  Pushing a stroller.  Shoveling snow.  Gardening.  Washing windows or floors. How can I be more active in my day-to-day activities?  Use stairs instead of an elevator.  Take a walk during your lunch break.  If you drive, park your car farther away from your work or school.  If you take public transportation, get off one stop early and walk the rest of the way.  Stand up or walk around during all of your indoor phone calls.  Get up, stretch, and walk around every 30 minutes throughout the day.  Enjoy exercise with a friend. Support to continue exercising will help you keep a regular routine of activity. What guidelines can I follow while exercising?  Before you start a new exercise program, talk with your health care provider.  Do not exercise so much that you hurt yourself, feel dizzy, or get very short of breath.  Wear comfortable clothes and wear shoes with good support.  Drink plenty of water while you exercise to prevent dehydration or heat stroke.  Work out until your breathing   and your heartbeat get faster. Where to find more information  U.[place] Department of Health and Human Services: www.hhs.gov  Centers for Disease Control and Prevention (CDC): www.cdc.gov Summary  Exercising regularly is important. It will improve your overall fitness,  flexibility, and endurance.  Regular exercise also will improve your overall health. It can help you control your weight, reduce stress, and improve your bone density.  Do not exercise so much that you hurt yourself, feel dizzy, or get very short of breath.  Before you start a new exercise program, talk with your health care provider. This information is not intended to replace advice given to you by your health care provider. Make sure you discuss any questions you have with your health care provider. Document Revised: 03/01/2017 Document Reviewed: 02/07/2017 Elsevier Patient Education  2020 Elsevier Inc.  

## 2019-06-16 NOTE — Progress Notes (Signed)
This visit occurred during the SARS-CoV-2 public health emergency.  Safety protocols were in place, including screening questions prior to the visit, additional usage of staff PPE, and extensive cleaning of exam room while observing appropriate contact time as indicated for disinfecting solutions.  Subjective:     Patient ID: Sara Obrien , female    DOB: 04-Nov-1945 , 74 y.o.   MRN: [id number]   Chief Complaint  Patient presents with  . Hyperlipidemia  . Hypertension    HPI  She is here today for chol/BP check. She reports compliance with meds. She has been playing pickle ball regularly.   Hyperlipidemia This is a chronic problem. The current episode started more than 1 year ago. The problem is controlled. Recent lipid tests were reviewed and are high. Exacerbating diseases include chronic renal disease and obesity. Pertinent negatives include no chest pain or shortness of breath.  Hypertension This is a chronic problem. The current episode started more than 1 year ago. The problem has been gradually improving since onset. The problem is controlled. Pertinent negatives include no blurred vision, chest pain, palpitations or shortness of breath. Risk factors for coronary artery disease include dyslipidemia, obesity, post-menopausal state and sedentary lifestyle. The current treatment provides moderate improvement. Hypertensive end-organ damage includes kidney disease. Identifiable causes of hypertension include chronic renal disease.     Past Medical History:  Diagnosis Date  . Anxiety   . Atypical chest pain 07/01/2017  . Essential hypertension 07/01/2017  . Gum disease    dentures worn  . Hyperlipidemia   . Hypertension   . Migraines      Family History  Problem Relation Age of Onset  . Brain cancer Mother   . Hypertension Mother   . Stroke Father   . Hypertension Father   . Hypertension Sister   . Cancer Brother   . Cancer Brother   . Cancer Brother   . Breast cancer  Sister      Current Outpatient Medications:  .  acetaminophen (TYLENOL) 500 MG tablet, Take 500 mg by mouth every 6 (six) hours as needed., Disp: , Rfl:  .  atorvastatin (LIPITOR) 20 MG tablet, TAKE 1 TABLET(20 MG) BY MOUTH DAILY, Disp: 90 tablet, Rfl: 1 .  Cholecalciferol (D3 HIGH POTENCY) 2000 units CAPS, Take 1 capsule by mouth daily., Disp: , Rfl:  .  diclofenac (VOLTAREN) 50 MG EC tablet, Take 50 mg by mouth 2 (two) times daily as needed., Disp: , Rfl:  .  escitalopram (LEXAPRO) 20 MG tablet, TAKE 1 TABLET BY MOUTH EVERY DAY, Disp: 90 tablet, Rfl: 0 .  olmesartan-hydrochlorothiazide (BENICAR HCT) 40-12.5 MG tablet, Take 1 tablet by mouth daily., Disp: 90 tablet, Rfl: 1 .  Omega-3 Fatty Acids (OMEGA 3 500 PO), Take by mouth., Disp: , Rfl:  .  TURMERIC PO, Take 1,300 mg by mouth daily., Disp: , Rfl:  .  Docosahexaenoic Acid (DHA PO), Take by mouth., Disp: , Rfl:    No Known Allergies   Review of Systems  Constitutional: Negative.   HENT: Positive for hearing loss.        She c/o decreased hearing. Thinks she needs to get her ears flushed.   Eyes: Negative for blurred vision.  Respiratory: Negative.  Negative for shortness of breath.   Cardiovascular: Negative.  Negative for chest pain and palpitations.  Gastrointestinal: Negative.   Neurological: Negative.   Psychiatric/Behavioral: Negative.      Today's Vitals   06/16/19 1120  BP: 116/82  Pulse: 65  Temp: 98.8 F (37.1 C)  TempSrc: Oral  Weight: 168 lb 12.8 oz (76.6 kg)  Height: 4' 11.8" (1.519 m)  PainSc: 0-No pain   Body mass index is 33.19 kg/m.   Objective:  Physical Exam Vitals and nursing note reviewed.  Constitutional:      Appearance: Normal appearance.  HENT:     Head: Normocephalic and atraumatic.     Right Ear: Ear canal and external ear normal. There is impacted cerumen.     Left Ear: Ear canal and external ear normal. There is impacted cerumen.     Ears:     Comments: She has impacted cerumen b/l.  It is hard and completely blocking her canal. TM not visualized.  Cardiovascular:     Rate and Rhythm: Normal rate and regular rhythm.     Heart sounds: Normal heart sounds.  Pulmonary:     Effort: Pulmonary effort is normal.     Breath sounds: Normal breath sounds.  Skin:    General: Skin is warm.  Neurological:     General: No focal deficit present.     Mental Status: She is alert.  Psychiatric:        Mood and Affect: Mood normal.        Behavior: Behavior normal.         Assessment And Plan:     1. Pure hypercholesterolemia  Chronic, I will check fasting lipid panel today. I will also check insulin level. She is encouraged to avoid fried foods and to continue her regular exercise regimen.   - Insulin, random(561) - Lipid panel  2. Hypertensive nephropathy  Chronic, well controlled. She will continue with current meds. She is encouraged to avoid adding salt to her foods. I will check renal function today.   - CMP14+EGFR  3. Chronic renal disease, stage II  Chronic, yet stable. She is encouraged to stay well hydrated.   4. Bilateral impacted cerumen  Right ear was flushed by irrigation without any complications. NO TM abnormalities noted. Irrigation was unable to be completed in left ear due to discomfort. She was advised to use OTC Debrox drops nightly.   - Ear Lavage  5. Class 1 obesity due to excess calories with serious comorbidity and body mass index (BMI) of 33.0 to 33.9 in adult  She is encouraged to strive for BMI less than 30 to decrease cardiac risk. She is advised to exercise no less than 150 minutes per week. She is willing to consider pharmaceutical agent for weight loss. I will make further recommendations once her labs are available for review.   [name], MD    THE PATIENT IS ENCOURAGED TO PRACTICE SOCIAL DISTANCING DUE TO THE COVID-19 PANDEMIC.

## 2019-06-17 ENCOUNTER — Telehealth: Payer: Self-pay

## 2019-06-17 LAB — CMP14+EGFR
ALT: 17 IU/L (ref 0–32)
AST: 21 IU/L (ref 0–40)
Albumin/Globulin Ratio: 1.2 (ref 1.2–2.2)
Albumin: 4.1 g/dL (ref 3.7–4.7)
Alkaline Phosphatase: 72 IU/L (ref 39–117)
BUN/Creatinine Ratio: 12 (ref 12–28)
BUN: 10 mg/dL (ref 8–27)
Bilirubin Total: 0.4 mg/dL (ref 0.0–1.2)
CO2: 25 mmol/L (ref 20–29)
Calcium: 9.8 mg/dL (ref 8.7–10.3)
Chloride: 101 mmol/L (ref 96–106)
Creatinine, Ser: 0.81 mg/dL (ref 0.57–1.00)
GFR calc Af Amer: 83 mL/min/{1.73_m2} (ref >59)
GFR calc non Af Amer: 72 mL/min/{1.73_m2} (ref >59)
Globulin, Total: 3.5 g/dL (ref 1.5–4.5)
Glucose: 82 mg/dL (ref 65–99)
Potassium: 4.1 mmol/L (ref 3.5–5.2)
Sodium: 140 mmol/L (ref 134–144)
Total Protein: 7.6 g/dL (ref 6.0–8.5)

## 2019-06-17 LAB — INSULIN, RANDOM: INSULIN: 9.7 u[IU]/mL (ref 2.6–24.9)

## 2019-06-17 LAB — LIPID PANEL
Chol/HDL Ratio: 4 ratio (ref 0.0–4.4)
Cholesterol, Total: 201 mg/dL — ABNORMAL HIGH (ref 100–199)
HDL: 50 mg/dL (ref >39)
LDL Chol Calc (NIH): 127 mg/dL — ABNORMAL HIGH (ref 0–99)
Triglycerides: 135 mg/dL (ref 0–149)
VLDL Cholesterol Cal: 24 mg/dL (ref 5–40)

## 2019-06-17 NOTE — Telephone Encounter (Signed)
The patient said that her ear is doing better and thank you.

## 2019-06-17 NOTE — Telephone Encounter (Signed)
-----   Message from [name], MD sent at 06/16/2019  9:24 PM EDT ----- Pls call to check on pt. How is her ear?

## 2019-07-17 ENCOUNTER — Other Ambulatory Visit: Payer: Self-pay | Admitting: Internal Medicine

## 2019-07-23 ENCOUNTER — Other Ambulatory Visit: Payer: Self-pay | Admitting: Internal Medicine

## 2019-08-12 NOTE — Progress Notes (Signed)
Virtual Visit via Telephone Note   This visit type was conducted due to national recommendations for restrictions regarding the COVID-19 Pandemic (e.g. social distancing) in an effort to limit this patient's exposure and mitigate transmission in our community.  Due to her co-morbid illnesses, this patient is at least at moderate risk for complications without adequate follow up.  This format is felt to be most appropriate for this patient at this time.  The patient did not have access to video technology/had technical difficulties with video requiring transitioning to audio format only (telephone).  All issues noted in this document were discussed and addressed.  No physical exam could be performed with this format.  Please refer to the patient's chart for her  consent to telehealth for [facility].   Date:  08/12/2019   ID:  Sara Obrien, DOB [birth year], MRN [id number]  Patient Location: Home Provider Location: Home  PCP:  [name], MD  Cardiologist:  Dr. Oval Linsey  Electrophysiologist:  None   Evaluation Performed:  Follow-Up Visit  Chief Complaint:  Follow up  History of Present Illness:    Sara Obrien is a 74 y.o. female we are following for ongoing assessment and management of hypertension hyperlipidemia and chest discomfort.  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  She had a Lexiscan Myoview on 07/19/2017 which really revealed no evidence of ischemia or prior infarction, EF was 56%.  She was last seen by Dr. Oval Linsey on 09/26/2017 and was without complaints.  She did have some mildly elevated blood pressure related to whitecoat hypertension.  No testing or new medication changes were made.  She was to continue on home olmesartan HCTZ, and pitavastatin for cholesterol control.   She continues to play Pickle ball 4 times a week and traveling to play. Still substitute teaches 2 days a week.  She is medically compliant. BP is well controlled  Labs and  refills are provided by PCP.   The patient does have symptoms concerning for COVID-19 infection (fever, chills, cough, or new shortness of breath).    Past Medical History:  Diagnosis Date  . Anxiety   . Atypical chest pain 07/01/2017  . Essential hypertension 07/01/2017  . Gum disease    dentures worn  . Hyperlipidemia   . Hypertension   . Migraines    Past Surgical History:  Procedure Laterality Date  . BREAST LUMPECTOMY    . TUBAL LIGATION       No outpatient medications have been marked as taking for the 08/13/19 encounter (Appointment) with Lendon Colonel, NP.     Allergies:   Patient has no known allergies.   Social History   Tobacco Use  . Smoking status: Never Smoker  . Smokeless tobacco: Never Used  Substance Use Topics  . Alcohol use: Yes    Comment: glass of wine  . Drug use: No     Family Hx: The patient's family history includes Brain cancer in her mother; Breast cancer in her sister; Cancer in her brother, brother, and brother; Hypertension in her father, mother, and sister; Stroke in her father.  ROS:   Please see the history of present illness.    All other systems reviewed and are negative.   Prior CV studies:   The following studies were reviewed today: Exercise Myoview 07/19/2017:  Clinically negative, electrically negative for ischemia Extensive motion artifact made evaluation for ischemia difficult  Myovue with normal perfusion No ischemia  LVEF 56%  Low risk study.  Labs/Other Tests and Data Reviewed:    EKG:  No ECG reviewed.  Recent Labs: 12/17/2018: Hemoglobin 13.5; Platelets 431; TSH 0.786 06/16/2019: ALT 17; BUN 10; Creatinine, Ser 0.81; Potassium 4.1; Sodium 140   Recent Lipid Panel Lab Results  Component Value Date/Time   CHOL 201 (H) 06/16/2019 02:39 PM   TRIG 135 06/16/2019 02:39 PM   HDL 50 06/16/2019 02:39 PM   CHOLHDL 4.0 06/16/2019 02:39 PM   LDLCALC 127 (H) 06/16/2019 02:39 PM    Wt Readings from Last 3  Encounters:  06/16/19 168 lb 12.8 oz (76.6 kg)  01/26/19 169 lb 12.8 oz (77 kg)  12/17/18 166 lb 6.4 oz (75.5 kg)     Objective:    Vital Signs:  There were no vitals taken for this visit.   VITAL SIGNS:  reviewed GEN:  no acute distress RESPIRATORY:  normal respiratory effort, symmetric expansion NEURO:  alert and oriented x 3, no obvious focal deficit PSYCH:  normal affect  ASSESSMENT & PLAN:    1. Hypertension: Well controlled on current medications, She remains active and is mindful of sodium intake. Her PCP is managing labs and refills.   2. Hyperlipidemia: Continues on low cholesterol diet. Remains on atorvastatin and Omega fatty acids.  She is followed by PCP for her refills and labs.   COVID-19 Education: The signs and symptoms of COVID-19 were discussed with the patient and how to seek care for testing (follow up with PCP or arrange E-visit).  The importance of social distancing was discussed today.  Time:   Today, I have spent 20  minutes with the patient with telehealth technology discussing the above problems.     Medication Adjustments/Labs and Tests Ordered: Current medicines are reviewed at length with the patient today.  Concerns regarding medicines are outlined above.   Tests Ordered: No orders of the defined types were placed in this encounter.   Medication Changes: No orders of the defined types were placed in this encounter.   Disposition:  Follow up one year  Signed, Phill Myron. West Pugh, ANP, [facility]  08/12/2019 10:51 AM    [facility]

## 2019-08-13 ENCOUNTER — Telehealth (INDEPENDENT_AMBULATORY_CARE_PROVIDER_SITE_OTHER): Payer: Medicare HMO | Admitting: Adult Health

## 2019-08-13 ENCOUNTER — Encounter: Payer: Self-pay | Admitting: Adult Health

## 2019-08-13 VITALS — Ht 59.8 in | Wt 163.0 lb

## 2019-08-13 DIAGNOSIS — E785 Hyperlipidemia, unspecified: Secondary | ICD-10-CM | POA: Diagnosis not present

## 2019-08-13 DIAGNOSIS — I10 Essential (primary) hypertension: Secondary | ICD-10-CM | POA: Diagnosis not present

## 2019-08-13 NOTE — Patient Instructions (Signed)
Medication Instructions:  Your physician recommends that you continue on your current medications as directed. Please refer to the Current Medication list given to you today.  *If you need a refill on your cardiac medications before your next appointment, please call your pharmacy*   Follow-Up: At [facility], you and your health needs are our priority.  As part of our continuing mission to provide you with exceptional heart care, we have created designated Provider Care Teams.  These Care Teams include your primary Cardiologist (physician) and Advanced Practice Providers (APPs -  Physician Assistants and Nurse Practitioners) who all work together to provide you with the care you need, when you need it.  We recommend signing up for the patient portal called "MyChart".  Sign up information is provided on this After Visit Summary.  MyChart is used to connect with patients for Virtual Visits (Telemedicine).  Patients are able to view lab/test results, encounter notes, upcoming appointments, etc.  Non-urgent messages can be sent to your provider as well.   To learn more about what you can do with MyChart, go to [email or web address].    Your next appointment:   12 month(s)  The format for your next appointment:   In Person  Provider:   You may see Skeet Latch, MD or one of the following Advanced Practice Providers on your designated Care Team:    Kerin Ransom, PA-C  [place], [place]  Coletta Memos, [place]    Other Instructions Please call our office 2 months in advance to schedule your annual follow-up appointment with Dr. Oval Linsey.

## 2019-10-10 DIAGNOSIS — H20011 Primary iridocyclitis, right eye: Secondary | ICD-10-CM | POA: Diagnosis not present

## 2019-10-10 DIAGNOSIS — Z0100 Encounter for examination of eyes and vision without abnormal findings: Secondary | ICD-10-CM | POA: Diagnosis not present

## 2019-10-10 DIAGNOSIS — H524 Presbyopia: Secondary | ICD-10-CM | POA: Diagnosis not present

## 2019-10-20 ENCOUNTER — Other Ambulatory Visit: Payer: Self-pay | Admitting: Nurse Practitioner

## 2019-11-17 ENCOUNTER — Other Ambulatory Visit: Payer: Self-pay

## 2019-11-17 MED ORDER — ATORVASTATIN CALCIUM 20 MG PO TABS: ORAL_TABLET | ORAL | 2 refills | Status: DC

## 2019-11-17 MED ORDER — ESCITALOPRAM OXALATE 20 MG PO TABS: 20.0000 mg | ORAL_TABLET | Freq: Every day | ORAL | 2 refills | Status: DC

## 2019-11-18 ENCOUNTER — Other Ambulatory Visit: Payer: Self-pay

## 2019-11-18 MED ORDER — OLMESARTAN MEDOXOMIL-HCTZ 40-12.5 MG PO TABS: 1.0000 | ORAL_TABLET | Freq: Every day | ORAL | 1 refills | Status: DC

## 2019-11-19 ENCOUNTER — Other Ambulatory Visit: Payer: Self-pay

## 2019-11-19 MED ORDER — OLMESARTAN MEDOXOMIL-HCTZ 40-12.5 MG PO TABS
1.0000 | ORAL_TABLET | Freq: Every day | ORAL | 2 refills | Status: DC
Start: 2019-11-19 — End: 2020-06-30

## 2019-11-19 MED ORDER — ESCITALOPRAM OXALATE 20 MG PO TABS
20.0000 mg | ORAL_TABLET | Freq: Every day | ORAL | 2 refills | Status: DC
Start: 2019-11-19 — End: 2019-11-25

## 2019-11-19 MED ORDER — ATORVASTATIN CALCIUM 20 MG PO TABS
ORAL_TABLET | ORAL | 2 refills | Status: DC
Start: 2019-11-19 — End: 2019-11-25

## 2019-11-25 ENCOUNTER — Other Ambulatory Visit: Payer: Self-pay

## 2019-11-25 MED ORDER — ATORVASTATIN CALCIUM 20 MG PO TABS: ORAL_TABLET | ORAL | 2 refills | Status: DC

## 2019-11-25 MED ORDER — ESCITALOPRAM OXALATE 20 MG PO TABS
20.0000 mg | ORAL_TABLET | Freq: Every day | ORAL | 2 refills | Status: DC
Start: 2019-11-25 — End: 2020-07-29

## 2019-11-27 DIAGNOSIS — Z1231 Encounter for screening mammogram for malignant neoplasm of breast: Secondary | ICD-10-CM | POA: Diagnosis not present

## 2019-11-27 LAB — HM MAMMOGRAPHY: HM Mammogram: NORMAL (ref 0–4)

## 2019-12-24 ENCOUNTER — Ambulatory Visit: Payer: Medicare HMO

## 2019-12-24 ENCOUNTER — Encounter: Payer: Medicare HMO | Admitting: Internal Medicine

## 2019-12-31 ENCOUNTER — Encounter: Payer: Self-pay | Admitting: Internal Medicine

## 2019-12-31 ENCOUNTER — Ambulatory Visit (INDEPENDENT_AMBULATORY_CARE_PROVIDER_SITE_OTHER): Payer: Medicare HMO | Admitting: Internal Medicine

## 2019-12-31 ENCOUNTER — Ambulatory Visit (INDEPENDENT_AMBULATORY_CARE_PROVIDER_SITE_OTHER): Payer: Medicare HMO

## 2019-12-31 ENCOUNTER — Other Ambulatory Visit: Payer: Self-pay

## 2019-12-31 VITALS — BP 112/68 | HR 67 | Temp 98.0°F | Ht 59.8 in | Wt 161.0 lb

## 2019-12-31 VITALS — BP 112/68 | HR 67 | Temp 98.0°F | Ht 60.0 in | Wt 161.0 lb

## 2019-12-31 DIAGNOSIS — E559 Vitamin D deficiency, unspecified: Secondary | ICD-10-CM

## 2019-12-31 DIAGNOSIS — I10 Essential (primary) hypertension: Secondary | ICD-10-CM

## 2019-12-31 DIAGNOSIS — E6609 Other obesity due to excess calories: Secondary | ICD-10-CM | POA: Diagnosis not present

## 2019-12-31 DIAGNOSIS — Z9189 Other specified personal risk factors, not elsewhere classified: Secondary | ICD-10-CM | POA: Diagnosis not present

## 2019-12-31 DIAGNOSIS — Z0000 Encounter for general adult medical examination without abnormal findings: Secondary | ICD-10-CM

## 2019-12-31 DIAGNOSIS — I129 Hypertensive chronic kidney disease with stage 1 through stage 4 chronic kidney disease, or unspecified chronic kidney disease: Secondary | ICD-10-CM | POA: Diagnosis not present

## 2019-12-31 DIAGNOSIS — N182 Chronic kidney disease, stage 2 (mild): Secondary | ICD-10-CM

## 2019-12-31 DIAGNOSIS — Z803 Family history of malignant neoplasm of breast: Secondary | ICD-10-CM

## 2019-12-31 DIAGNOSIS — Z79899 Other long term (current) drug therapy: Secondary | ICD-10-CM

## 2019-12-31 DIAGNOSIS — Z6831 Body mass index (BMI) 31.0-31.9, adult: Secondary | ICD-10-CM

## 2019-12-31 LAB — POCT URINALYSIS DIPSTICK
Glucose, UA: NEGATIVE
Nitrite, UA: NEGATIVE
Protein, UA: NEGATIVE
Spec Grav, UA: >=1.03 — AB (ref 1.010–1.025)
Urobilinogen, UA: 0.2 E.U./dL
pH, UA: 5.5 (ref 5.0–8.0)

## 2019-12-31 LAB — POCT UA - MICROALBUMIN
Creatinine, POC: 300 mg/dL
Microalbumin Ur, POC: 80 mg/L

## 2019-12-31 NOTE — Progress Notes (Signed)
This visit occurred during the SARS-CoV-2 public health emergency.  Safety protocols were in place, including screening questions prior to the visit, additional usage of staff PPE, and extensive cleaning of exam room while observing appropriate contact time as indicated for disinfecting solutions.  Subjective:   Sara Obrien is a 74 y.o. female who presents for Medicare Annual (Subsequent) preventive examination.  Review of Systems     Cardiac Risk Factors include: advanced age (>7men, >67 women);dyslipidemia;hypertension;obesity (BMI >30kg/m2)     Objective:    Today's Vitals   12/31/19 1118  BP: 112/68  Pulse: 67  Temp: 98 F (36.7 C)  TempSrc: Oral  Weight: 161 lb (73 kg)  Height: 5' (1.524 m)   Body mass index is 31.44 kg/m.  Advanced Directives 12/31/2019 12/16/2018  Does Patient Have a Medical Advance Directive? No No  Would patient like information on creating a medical advance directive? No - Patient declined -    Current Medications (verified) Outpatient Encounter Medications as of 12/31/2019  Medication Sig  . acetaminophen (TYLENOL) 500 MG tablet Take 500 mg by mouth every 6 (six) hours as needed.  [name] atorvastatin (LIPITOR) 20 MG tablet Take one tablet by mouth daily.  . Cholecalciferol (D3 HIGH POTENCY) 2000 units CAPS Take 1 capsule by mouth daily.  [name] escitalopram (LEXAPRO) 20 MG tablet Take 1 tablet (20 mg total) by mouth daily.  [name] olmesartan-hydrochlorothiazide (BENICAR HCT) 40-12.5 MG tablet Take 1 tablet by mouth daily.  . Omega-3 Fatty Acids (OMEGA 3 500 PO) Take by mouth.  . TURMERIC PO Take 1,300 mg by mouth daily.   No facility-administered encounter medications on file as of 12/31/2019.    Allergies (verified) Patient has no known allergies.   History: Past Medical History:  Diagnosis Date  . Anxiety   . Atypical chest pain 07/01/2017  . Essential hypertension 07/01/2017  . Gum disease    dentures worn  . Hyperlipidemia   . Hypertension   .  Migraines    Past Surgical History:  Procedure Laterality Date  . BREAST LUMPECTOMY    . TUBAL LIGATION     Family History  Problem Relation Age of Onset  . Brain cancer Mother   . Hypertension Mother   . Stroke Father   . Hypertension Father   . Hypertension Sister   . Cancer Brother   . Cancer Brother   . Cancer Brother   . Breast cancer Sister    Social History   Socioeconomic History  . Marital status: Married    Spouse name: Not on file  . Number of children: Not on file  . Years of education: Not on file  . Highest education level: Not on file  Occupational History  . Occupation: semi- retired  Tobacco Use  . Smoking status: Never Smoker  . Smokeless tobacco: Never Used  Vaping Use  . Vaping Use: Never used  Substance and Sexual Activity  . Alcohol use: Yes    Comment: glass of wine  . Drug use: No  . Sexual activity: Yes    Birth control/protection: Abstinence  Other Topics Concern  . Not on file  Social History Narrative  . Not on file   Social Determinants of Health   Financial Resource Strain: Low Risk   . Difficulty of Paying Living Expenses: Not hard at all  Food Insecurity: No Food Insecurity  . Worried About Charity fundraiser in the Last Year: Never true  . Ran Out of Food in the  Last Year: Never true  Transportation Needs: No Transportation Needs  . Lack of Transportation (Medical): No  . Lack of Transportation (Non-Medical): No  Physical Activity: Sufficiently Active  . Days of Exercise per Week: 4 days  . Minutes of Exercise per Session: 120 min  Stress: No Stress Concern Present  . Feeling of Stress : Not at all  Social Connections:   . Frequency of Communication with Friends and Family: Not on file  . Frequency of Social Gatherings with Friends and Family: Not on file  . Attends Religious Services: Not on file  . Active Member of Clubs or Organizations: Not on file  . Attends Archivist Meetings: Not on file  . Marital  Status: Not on file    Tobacco Counseling Counseling given: Not Answered   Clinical Intake:  Pre-visit preparation completed: Yes  Pain : No/denies pain     Nutritional Status: BMI > 30  Obese Nutritional Risks: None Diabetes: No  How often do you need to have someone help you when you read instructions, pamphlets, or other written materials from your doctor or pharmacy?: 1 - Never What is the last grade level you completed in school?: master's degree  Diabetic? no  Interpreter Needed?: No  Information entered by :: NAllen LPN   Activities of Daily Living In your present state of health, do you have any difficulty performing the following activities: 12/31/2019 12/31/2019  Hearing? N N  Vision? N N  Difficulty concentrating or making decisions? N N  Walking or climbing stairs? N N  Dressing or bathing? N N  Doing errands, shopping? N N  Preparing Food and eating ? N -  Using the Toilet? N -  In the past six months, have you accidently leaked urine? N -  Do you have problems with loss of bowel control? N -  Managing your Medications? N -  Managing your Finances? N -  Housekeeping or managing your Housekeeping? N -  Some recent data might be hidden    Patient Care Team: [name], MD as PCP - General (Internal Medicine)  Indicate any recent Medical Services you may have received from other than [facility] providers in the past year (date may be approximate).     Assessment:   This is a routine wellness examination for [facility].  Hearing/Vision screen  Hearing Screening   125Hz  250Hz  500Hz  1000Hz  2000Hz  3000Hz  4000Hz  6000Hz  8000Hz   Right ear:           Left ear:           Vision Screening Comments: Regular eye exams, [facility]  Dietary issues and exercise activities discussed: Current Exercise Habits: Home exercise routine, Type of exercise: Other - see comments (plays pickle ball), Time (Minutes): > 60, Frequency (Times/Week): 4, Weekly Exercise  (Minutes/Week): 0  Goals    . Patient Stated     12/31/2019, wants to lose 20 pounds    . Weight (lb) < 200 lb (90.7 kg)     12/16/2018, wants to weigh 140 pounds      Depression Screen PHQ 2/9 Scores 12/31/2019 01/26/2019 12/17/2018 12/16/2018 05/08/2018 04/29/2018  PHQ - 2 Score 0 0 0 0 3 0  PHQ- 9 Score - - 0 0 6 -    Fall Risk Fall Risk  12/31/2019 01/26/2019 12/17/2018 12/16/2018 05/08/2018  Falls in the past year? 0 0 1 1 0  Comment - - - 1 fall; while pickle ball -  Number falls in past yr: - -  0 - -  Injury with Fall? - - 1 1 -  Comment - - - injured right rotator cuff -  Risk for fall due to : Medication side effect - - History of fall(s) -  Follow up Falls evaluation completed;Education provided;Falls prevention discussed - - Falls evaluation completed;Falls prevention discussed -    Any stairs in or around the home? Yes  If so, are there any without handrails? No  Home free of loose throw rugs in walkways, pet beds, electrical cords, etc? Yes  Adequate lighting in your home to reduce risk of falls? Yes   ASSISTIVE DEVICES UTILIZED TO PREVENT FALLS:  Life alert? No  Use of a cane, walker or w/c? No  Grab bars in the bathroom? Yes  Shower chair or bench in shower? Yes  Elevated toilet seat or a handicapped toilet? No   TIMED UP AND GO:  Was the test performed? No .   Gait steady and fast without use of assistive device  Cognitive Function:     6CIT Screen 12/31/2019 12/16/2018 12/16/2018  What Year? 0 points 0 points 0 points  What month? 0 points 0 points 0 points  What time? 0 points 0 points 0 points  Count back from 20 0 points 0 points 0 points  Months in reverse 0 points 0 points 0 points  Repeat phrase 2 points 0 points 0 points  Total Score 2 0 0    Immunizations Immunization History  Administered Date(s) Administered  . Fluad Quad(high Dose 65+) 12/10/2019  . Influenza, High Dose Seasonal PF 12/17/2018  . PFIZER SARS-COV-2 Vaccination 05/08/2019,  06/02/2019  . Zoster 07/01/2016    TDAP status: Up to date Flu Vaccine status: Up to date Pneumococcal vaccine status: Up to date Covid-19 vaccine status: Completed vaccines  Qualifies for Shingles Vaccine? Yes   Zostavax completed Yes   Shingrix Completed?: No.    Education has been provided regarding the importance of this vaccine. Patient has been advised to call insurance company to determine out of pocket expense if they have not yet received this vaccine. Advised may also receive vaccine at local pharmacy or Health Dept. Verbalized acceptance and understanding.  Screening Tests Health Maintenance  Topic Date Due  . MAMMOGRAM  11/26/2021  . TETANUS/TDAP  09/23/2023  . COLONOSCOPY  04/13/2025  . INFLUENZA VACCINE  Completed  . DEXA SCAN  Completed  . COVID-19 Vaccine  Completed  . Hepatitis C Screening  Completed  . PNA vac Low Risk Adult  Completed    Health Maintenance  There are no preventive care reminders to display for this patient.  Colorectal cancer screening: Completed 04/14/2015. Repeat every 10 years Mammogram status: Completed 11/27/2019. Repeat every year Bone Density status: Completed 12/03/2017.   Lung Cancer Screening: (Low Dose CT Chest recommended if Age 84-80 years, 30 pack-year currently smoking OR have quit w/in 15years.) does not qualify.   Lung Cancer Screening Referral: no  Additional Screening:  Hepatitis C Screening: does qualify; Completed 08/03/2012  Vision Screening: Recommended annual ophthalmology exams for early detection of glaucoma and other disorders of the eye. Is the patient up to date with their annual eye exam?  Yes  Who is the provider or what is the name of the office in which the patient attends annual eye exams? [facility] If pt is not established with a provider, would they like to be referred to a provider to establish care? No .   Dental Screening: Recommended annual dental exams for proper  oral hygiene  Community Resource  Referral / Chronic Care Management: CRR required this visit?  No   CCM required this visit?  No      Plan:     I have personally reviewed and noted the following in the patient's chart:   . Medical and social history . Use of alcohol, tobacco or illicit drugs  . Current medications and supplements . Functional ability and status . Nutritional status . Physical activity . Advanced directives . List of other physicians . Hospitalizations, surgeries, and ER visits in previous 12 months . Vitals . Screenings to include cognitive, depression, and falls . Referrals and appointments  In addition, I have reviewed and discussed with patient certain preventive protocols, quality metrics, and best practice recommendations. A written personalized care plan for preventive services as well as general preventive health recommendations were provided to patient.     Kellie Simmering, LPN   07/03/4740   Nurse Notes:

## 2019-12-31 NOTE — Patient Instructions (Signed)
Health Maintenance, Female Adopting a healthy lifestyle and getting preventive care are important in promoting health and wellness. Ask your health care provider about:  The right schedule for you to have regular tests and exams.  Things you can do on your own to prevent diseases and keep yourself healthy. What should I know about diet, weight, and exercise? Eat a healthy diet   Eat a diet that includes plenty of vegetables, fruits, low-fat dairy products, and lean protein.  Do not eat a lot of foods that are high in solid fats, added sugars, or sodium. Maintain a healthy weight Body mass index (BMI) is used to identify weight problems. It estimates body fat based on height and weight. Your health care provider can help determine your BMI and help you achieve or maintain a healthy weight. Get regular exercise Get regular exercise. This is one of the most important things you can do for your health. Most adults should:  Exercise for at least 150 minutes each week. The exercise should increase your heart rate and make you sweat (moderate-intensity exercise).  Do strengthening exercises at least twice a week. This is in addition to the moderate-intensity exercise.  Spend less time sitting. Even light physical activity can be beneficial. Watch cholesterol and blood lipids Have your blood tested for lipids and cholesterol at 74 years of age, then have this test every 5 years. Have your cholesterol levels checked more often if:  Your lipid or cholesterol levels are high.  You are older than 74 years of age.  You are at high risk for heart disease. What should I know about cancer screening? Depending on your health history and family history, you may need to have cancer screening at various ages. This may include screening for:  Breast cancer.  Cervical cancer.  Colorectal cancer.  Skin cancer.  Lung cancer. What should I know about heart disease, diabetes, and high blood  pressure? Blood pressure and heart disease  High blood pressure causes heart disease and increases the risk of stroke. This is more likely to develop in people who have high blood pressure readings, are of African descent, or are overweight.  Have your blood pressure checked: ? Every 3-5 years if you are 18-39 years of age. ? Every year if you are 40 years old or older. Diabetes Have regular diabetes screenings. This checks your fasting blood sugar level. Have the screening done:  Once every three years after age 40 if you are at a normal weight and have a low risk for diabetes.  More often and at a younger age if you are overweight or have a high risk for diabetes. What should I know about preventing infection? Hepatitis B If you have a higher risk for hepatitis B, you should be screened for this virus. Talk with your health care provider to find out if you are at risk for hepatitis B infection. Hepatitis C Testing is recommended for:  Everyone born from 1945 through 1965.  Anyone with known risk factors for hepatitis C. Sexually transmitted infections (STIs)  Get screened for STIs, including gonorrhea and chlamydia, if: ? You are sexually active and are younger than 74 years of age. ? You are older than 74 years of age and your health care provider tells you that you are at risk for this type of infection. ? Your sexual activity has changed since you were last screened, and you are at increased risk for chlamydia or gonorrhea. Ask your health care provider if   you are at risk.  Ask your health care provider about whether you are at high risk for HIV. Your health care provider may recommend a prescription medicine to help prevent HIV infection. If you choose to take medicine to prevent HIV, you should first get tested for HIV. You should then be tested every 3 months for as long as you are taking the medicine. Pregnancy  If you are about to stop having your period (premenopausal) and  you may become pregnant, seek counseling before you get pregnant.  Take 400 to 800 micrograms (mcg) of folic acid every day if you become pregnant.  Ask for birth control (contraception) if you want to prevent pregnancy. Osteoporosis and menopause Osteoporosis is a disease in which the bones lose minerals and strength with aging. This can result in bone fractures. If you are 65 years old or older, or if you are at risk for osteoporosis and fractures, ask your health care provider if you should:  Be screened for bone loss.  Take a calcium or vitamin D supplement to lower your risk of fractures.  Be given hormone replacement therapy (HRT) to treat symptoms of menopause. Follow these instructions at home: Lifestyle  Do not use any products that contain nicotine or tobacco, such as cigarettes, e-cigarettes, and chewing tobacco. If you need help quitting, ask your health care provider.  Do not use street drugs.  Do not share needles.  Ask your health care provider for help if you need support or information about quitting drugs. Alcohol use  Do not drink alcohol if: ? Your health care provider tells you not to drink. ? You are pregnant, may be pregnant, or are planning to become pregnant.  If you drink alcohol: ? Limit how much you use to 0-1 drink a day. ? Limit intake if you are breastfeeding.  Be aware of how much alcohol is in your drink. In the [place] one drink equals one 12 oz bottle of beer (355 mL), one 5 oz glass of wine (148 mL), or one 1 oz glass of hard liquor (44 mL). General instructions  Schedule regular health, dental, and eye exams.  Stay current with your vaccines.  Tell your health care provider if: ? You often feel depressed. ? You have ever been abused or do not feel safe at home. Summary  Adopting a healthy lifestyle and getting preventive care are important in promoting health and wellness.  Follow your health care provider's instructions about healthy  diet, exercising, and getting tested or screened for diseases.  Follow your health care provider's instructions on monitoring your cholesterol and blood pressure. This information is not intended to replace advice given to you by your health care provider. Make sure you discuss any questions you have with your health care provider. Document Revised: 03/12/2018 Document Reviewed: 03/12/2018 Elsevier Patient Education  2020 [employer]  

## 2019-12-31 NOTE — Patient Instructions (Signed)
[name] , Thank you for taking time to come for your Medicare Wellness Visit. I appreciate your ongoing commitment to your health goals. Please review the following plan we discussed and let me know if I can assist you in the future.   Screening recommendations/referrals: Colonoscopy: completed 04/14/2015 Mammogram: completed 11/27/2019 Bone Density: completed 12/03/2017 Recommended yearly ophthalmology/optometry visit for glaucoma screening and checkup Recommended yearly dental visit for hygiene and checkup  Vaccinations: Influenza vaccine: completed 12/10/2019 Pneumococcal vaccine: completed 11/07/2017 Tdap vaccine: completed 09/22/2013 Shingles vaccine: discussed   Covid-19: 06/02/2019, 05/08/2019  Advanced directives: Advance directive discussed with you today. Even though you declined this today please call our office should you change your mind and we can give you the proper paperwork for you to fill out.  Conditions/risks identified: none  Next appointment: Follow up in one year for your annual wellness visit    Preventive Care 65 Years and Older, Female Preventive care refers to lifestyle choices and visits with your health care provider that can promote health and wellness. What does preventive care include?  A yearly physical exam. This is also called an annual well check.  Dental exams once or twice a year.  Routine eye exams. Ask your health care provider how often you should have your eyes checked.  Personal lifestyle choices, including:  Daily care of your teeth and gums.  Regular physical activity.  Eating a healthy diet.  Avoiding tobacco and drug use.  Limiting alcohol use.  Practicing safe sex.  Taking low-dose aspirin every day.  Taking vitamin and mineral supplements as recommended by your health care provider. What happens during an annual well check? The services and screenings done by your health care provider during your annual well check will depend on  your age, overall health, lifestyle risk factors, and family history of disease. Counseling  Your health care provider may ask you questions about your:  Alcohol use.  Tobacco use.  Drug use.  Emotional well-being.  Home and relationship well-being.  Sexual activity.  Eating habits.  History of falls.  Memory and ability to understand (cognition).  Work and work Statistician.  Reproductive health. Screening  You may have the following tests or measurements:  Height, weight, and BMI.  Blood pressure.  Lipid and cholesterol levels. These may be checked every 5 years, or more frequently if you are over 98 years old.  Skin check.  Lung cancer screening. You may have this screening every year starting at age 78 if you have a 30-pack-year history of smoking and currently smoke or have quit within the past 15 years.  Fecal occult blood test (FOBT) of the stool. You may have this test every year starting at age 69.  Flexible sigmoidoscopy or colonoscopy. You may have a sigmoidoscopy every 5 years or a colonoscopy every 10 years starting at age 1.  Hepatitis C blood test.  Hepatitis B blood test.  Sexually transmitted disease (STD) testing.  Diabetes screening. This is done by checking your blood sugar (glucose) after you have not eaten for a while (fasting). You may have this done every 1-3 years.  Bone density scan. This is done to screen for osteoporosis. You may have this done starting at age 58.  Mammogram. This may be done every 1-2 years. Talk to your health care provider about how often you should have regular mammograms. Talk with your health care provider about your test results, treatment options, and if necessary, the need for more tests. Vaccines  Your  health care provider may recommend certain vaccines, such as:  Influenza vaccine. This is recommended every year.  Tetanus, diphtheria, and acellular pertussis (Tdap, Td) vaccine. You may need a Td booster  every 10 years.  Zoster vaccine. You may need this after age 40.  Pneumococcal 13-valent conjugate (PCV13) vaccine. One dose is recommended after age 13.  Pneumococcal polysaccharide (PPSV23) vaccine. One dose is recommended after age 36. Talk to your health care provider about which screenings and vaccines you need and how often you need them. This information is not intended to replace advice given to you by your health care provider. Make sure you discuss any questions you have with your health care provider. Document Released: 04/15/2015 Document Revised: 12/07/2015 Document Reviewed: 01/18/2015 Elsevier Interactive Patient Education  2017 [employer] Prevention in the Home Falls can cause injuries. They can happen to people of all ages. There are many things you can do to make your home safe and to help prevent falls. What can I do on the outside of my home?  Regularly fix the edges of walkways and driveways and fix any cracks.  Remove anything that might make you trip as you walk through a door, such as a raised step or threshold.  Trim any bushes or trees on the path to your home.  Use bright outdoor lighting.  Clear any walking paths of anything that might make someone trip, such as rocks or tools.  Regularly check to see if handrails are loose or broken. Make sure that both sides of any steps have handrails.  Any raised decks and porches should have guardrails on the edges.  Have any leaves, snow, or ice cleared regularly.  Use sand or salt on walking paths during winter.  Clean up any spills in your garage right away. This includes oil or grease spills. What can I do in the bathroom?  Use night lights.  Install grab bars by the toilet and in the tub and shower. Do not use towel bars as grab bars.  Use non-skid mats or decals in the tub or shower.  If you need to sit down in the shower, use a plastic, non-slip stool.  Keep the floor dry. Clean up any  water that spills on the floor as soon as it happens.  Remove soap buildup in the tub or shower regularly.  Attach bath mats securely with double-sided non-slip rug tape.  Do not have throw rugs and other things on the floor that can make you trip. What can I do in the bedroom?  Use night lights.  Make sure that you have a light by your bed that is easy to reach.  Do not use any sheets or blankets that are too big for your bed. They should not hang down onto the floor.  Have a firm chair that has side arms. You can use this for support while you get dressed.  Do not have throw rugs and other things on the floor that can make you trip. What can I do in the kitchen?  Clean up any spills right away.  Avoid walking on wet floors.  Keep items that you use a lot in easy-to-reach places.  If you need to reach something above you, use a strong step stool that has a grab bar.  Keep electrical cords out of the way.  Do not use floor polish or wax that makes floors slippery. If you must use wax, use non-skid floor wax.  Do not  have throw rugs and other things on the floor that can make you trip. What can I do with my stairs?  Do not leave any items on the stairs.  Make sure that there are handrails on both sides of the stairs and use them. Fix handrails that are broken or loose. Make sure that handrails are as long as the stairways.  Check any carpeting to make sure that it is firmly attached to the stairs. Fix any carpet that is loose or worn.  Avoid having throw rugs at the top or bottom of the stairs. If you do have throw rugs, attach them to the floor with carpet tape.  Make sure that you have a light switch at the top of the stairs and the bottom of the stairs. If you do not have them, ask someone to add them for you. What else can I do to help prevent falls?  Wear shoes that:  Do not have high heels.  Have rubber bottoms.  Are comfortable and fit you well.  Are closed  at the toe. Do not wear sandals.  If you use a stepladder:  Make sure that it is fully opened. Do not climb a closed stepladder.  Make sure that both sides of the stepladder are locked into place.  Ask someone to hold it for you, if possible.  Clearly mark and make sure that you can see:  Any grab bars or handrails.  First and last steps.  Where the edge of each step is.  Use tools that help you move around (mobility aids) if they are needed. These include:  Canes.  Walkers.  Scooters.  Crutches.  Turn on the lights when you go into a dark area. Replace any light bulbs as soon as they burn out.  Set up your furniture so you have a clear path. Avoid moving your furniture around.  If any of your floors are uneven, fix them.  If there are any pets around you, be aware of where they are.  Review your medicines with your doctor. Some medicines can make you feel dizzy. This can increase your chance of falling. Ask your doctor what other things that you can do to help prevent falls. This information is not intended to replace advice given to you by your health care provider. Make sure you discuss any questions you have with your health care provider. Document Released: 01/13/2009 Document Revised: 08/25/2015 Document Reviewed: 04/23/2014 Elsevier Interactive Patient Education  2017 [employer].

## 2019-12-31 NOTE — Progress Notes (Signed)
I,Katawbba Wiggins,acting as a Education administrator for [name], MD.,have documented all relevant documentation on the behalf of [name], MD,as directed by  [name], MD while in the presence of [name], MD.  This visit occurred during the SARS-CoV-2 public health emergency.  Safety protocols were in place, including screening questions prior to the visit, additional usage of staff PPE, and extensive cleaning of exam room while observing appropriate contact time as indicated for disinfecting solutions.  Subjective:     Patient ID: Sara Obrien , female    DOB: 08-28-1945 , 74 y.o.   MRN: [id number]   Chief Complaint  Patient presents with  . Annual Exam  . Hypertension    HPI  She is here today for a full physical examination.  She is followed by GYN for her pelvic exams. She has no specific concerns or complaints at this time. She denies headaches, chest pain and shortness of breath.  Hypertension This is a chronic problem. The current episode started more than 1 year ago. The problem has been gradually improving since onset. The problem is controlled. Pertinent negatives include no blurred vision, chest pain, palpitations or shortness of breath. Past treatments include angiotensin blockers and diuretics. The current treatment provides moderate improvement. There are no compliance problems.  Hypertensive end-organ damage includes kidney disease.     Past Medical History:  Diagnosis Date  . Anxiety   . Atypical chest pain 07/01/2017  . Essential hypertension 07/01/2017  . Gum disease    dentures worn  . Hyperlipidemia   . Hypertension   . Migraines      Family History  Problem Relation Age of Onset  . Brain cancer Mother   . Hypertension Mother   . Stroke Father   . Hypertension Father   . Hypertension Sister   . Cancer Brother   . Cancer Brother   . Cancer Brother   . Breast cancer Sister      Current Outpatient Medications:  .  acetaminophen  (TYLENOL) 500 MG tablet, Take 500 mg by mouth every 6 (six) hours as needed., Disp: , Rfl:  .  atorvastatin (LIPITOR) 20 MG tablet, Take one tablet by mouth daily., Disp: 90 tablet, Rfl: 2 .  Cholecalciferol (D3 HIGH POTENCY) 2000 units CAPS, Take 1 capsule by mouth daily., Disp: , Rfl:  .  escitalopram (LEXAPRO) 20 MG tablet, Take 1 tablet (20 mg total) by mouth daily., Disp: 90 tablet, Rfl: 2 .  Magnesium 250 MG TABS, Take by mouth. 1 daily, Disp: , Rfl:  .  olmesartan-hydrochlorothiazide (BENICAR HCT) 40-12.5 MG tablet, Take 1 tablet by mouth daily., Disp: 90 tablet, Rfl: 2 .  Omega-3 Fatty Acids (OMEGA 3 500 PO), Take by mouth., Disp: , Rfl:  .  TURMERIC PO, Take 1,300 mg by mouth daily., Disp: , Rfl:    No Known Allergies    The patient states she uses post menopausal status for birth control. Last LMP was No LMP recorded. Patient is postmenopausal.. Negative for Dysmenorrhea. Negative for: breast discharge, breast lump(s), breast pain and breast self exam. Associated symptoms include abnormal vaginal bleeding. Pertinent negatives include abnormal bleeding (hematology), anxiety, decreased libido, depression, difficulty falling sleep, dyspareunia, history of infertility, nocturia, sexual dysfunction, sleep disturbances, urinary incontinence, urinary urgency, vaginal discharge and vaginal itching. Diet regular.The patient states her exercise level is  moderate. She likes to play pickle ball.   . The patient's tobacco use is:  Social History   Tobacco Use  Smoking Status Never Smoker  Smokeless Tobacco Never Used  . She has been exposed to passive smoke. The patient's alcohol use is:  Social History   Substance and Sexual Activity  Alcohol Use Yes   Comment: glass of wine   Review of Systems  Constitutional: Negative.   HENT: Negative.   Eyes: Negative.  Negative for blurred vision.  Respiratory: Negative.  Negative for shortness of breath.   Cardiovascular: Negative.  Negative for  chest pain and palpitations.  Gastrointestinal: Negative.   Endocrine: Negative.   Genitourinary: Negative.   Musculoskeletal: Negative.   Skin: Negative.   Allergic/Immunologic: Negative.   Neurological: Negative.   Hematological: Negative.   Psychiatric/Behavioral: Negative.      Today's Vitals   12/31/19 1109  BP: 112/68  Pulse: 67  Temp: 98 F (36.7 C)  TempSrc: Oral  Weight: 161 lb (73 kg)  Height: 4' 11.8" (1.519 m)  PainSc: 0-No pain   Body mass index is 31.65 kg/m.  Wt Readings from Last 3 Encounters:  12/31/19 161 lb (73 kg)  12/31/19 161 lb (73 kg)  08/13/19 163 lb (73.9 kg)   Objective:  Physical Exam Constitutional:      General: She is not in acute distress.    Appearance: Normal appearance. She is well-developed. She is obese.  HENT:     Head: Normocephalic and atraumatic.     Right Ear: Hearing, tympanic membrane, ear canal and external ear normal. There is no impacted cerumen.     Left Ear: Hearing, tympanic membrane, ear canal and external ear normal. There is no impacted cerumen.     Nose:     Comments: Deferred, masked    Mouth/Throat:     Comments: Deferred, masked Eyes:     General: Lids are normal.     Extraocular Movements: Extraocular movements intact.     Conjunctiva/sclera: Conjunctivae normal.     Pupils: Pupils are equal, round, and reactive to light.     Funduscopic exam:    Right eye: No papilledema.        Left eye: No papilledema.  Neck:     Thyroid: No thyroid mass.     Vascular: No carotid bruit.  Cardiovascular:     Rate and Rhythm: Normal rate and regular rhythm.     Pulses: Normal pulses.     Heart sounds: Normal heart sounds. No murmur heard.   Pulmonary:     Effort: Pulmonary effort is normal.     Breath sounds: Normal breath sounds.  Chest:     Breasts: Tanner Score is 5.        Right: Normal.        Left: Normal.  Abdominal:     General: Bowel sounds are normal. There is no distension.     Palpations: Abdomen  is soft.     Tenderness: There is no abdominal tenderness.  Musculoskeletal:        General: No swelling. Normal range of motion.     Cervical back: Full passive range of motion without pain, normal range of motion and neck supple.     Right lower leg: No edema.     Left lower leg: No edema.  Skin:    General: Skin is warm and dry.     Capillary Refill: Capillary refill takes less than 2 seconds.  Neurological:     General: No focal deficit present.     Mental Status: She is alert and oriented to person, place, and time.  Cranial Nerves: No cranial nerve deficit.     Sensory: No sensory deficit.  Psychiatric:        Mood and Affect: Mood normal.        Behavior: Behavior normal.        Thought Content: Thought content normal.        Judgment: Judgment normal.         Assessment And Plan:     1. Encounter for annual physical exam Comments: A full exam was performed. Importance of monthly self breast exams was discussed with the patient.  PATIENT IS ADVISED TO GET 30-45 MINUTES REGULAR EXERCISE NO LESS THAN FOUR TO FIVE DAYS PER WEEK - BOTH WEIGHTBEARING EXERCISES AND AEROBIC ARE RECOMMENDED.  PATIENT IS ADVISED TO FOLLOW A HEALTHY DIET WITH AT LEAST SIX FRUITS/VEGGIES PER DAY, DECREASE INTAKE OF RED MEAT, AND TO INCREASE FISH INTAKE TO TWO DAYS PER WEEK.  MEATS/FISH SHOULD NOT BE FRIED, BAKED OR BROILED IS PREFERABLE.  I SUGGEST WEARING SPF 50 SUNSCREEN ON EXPOSED PARTS AND ESPECIALLY WHEN IN THE DIRECT SUNLIGHT FOR AN EXTENDED PERIOD OF TIME.  PLEASE AVOID FAST FOOD RESTAURANTS AND INCREASE YOUR WATER INTAKE.   2. Hypertensive nephropathy Comments: Chronic, well controlled. She will continue with current meds. She is encouraged to avoid adding salt to her foods. EKG performed, NSR w/ low voltage. She will rto in six months for re-evaluation.   - EKG 12-Lead - CBC - CMP14+EGFR - Lipid panel  3. Chronic renal disease, stage II Comments: Chronic. I will check GFR, Cr today.   She is encouraged to stay well hydrated and keep BP well controlled to prevent progression of CKD.   4. Vitamin D deficiency disease Comments: I will check a vitamin D level and supplement as needed.  - VITAMIN D 25 Hydroxy (Vit-D Deficiency, Fractures)  5. At high risk for breast cancer Comments: I will schedule her for bilateral breast MRI. She plans to schedule appt with breast cancer risk reduction specialist.  - MR BREAST BILATERAL WO CONTRAST; Future  6. Class 1 obesity due to excess calories with serious comorbidity and body mass index (BMI) of 31.0 to 31.9 in adult Comments: She is encouraged to strive for BMI less than 29 to decrease cardiac risk. Encouraged to aim for at least 150 minutes of exercise per week.   7. Family history of breast cancer - MR BREAST BILATERAL WO CONTRAST; Future  8. Drug therapy - Vitamin B12     Patient was given opportunity to ask questions. Patient verbalized understanding of the plan and was able to repeat key elements of the plan. All questions were answered to their satisfaction.   [name], MD   I, [name], MD, have reviewed all documentation for this visit. The documentation on 01/24/20 for the exam, diagnosis, procedures, and orders are all accurate and complete.  THE PATIENT IS ENCOURAGED TO PRACTICE SOCIAL DISTANCING DUE TO THE COVID-19 PANDEMIC.

## 2019-12-31 NOTE — Addendum Note (Signed)
Addended by: Glenna Durand E on: 12/31/2019 12:41 PM   Modules accepted: Orders

## 2020-01-01 LAB — CMP14+EGFR
ALT: 10 IU/L (ref 0–32)
AST: 19 IU/L (ref 0–40)
Albumin/Globulin Ratio: 1.3 (ref 1.2–2.2)
Albumin: 4.5 g/dL (ref 3.7–4.7)
Alkaline Phosphatase: 78 IU/L (ref 44–121)
BUN/Creatinine Ratio: 13 (ref 12–28)
BUN: 12 mg/dL (ref 8–27)
Bilirubin Total: 0.4 mg/dL (ref 0.0–1.2)
CO2: 27 mmol/L (ref 20–29)
Calcium: 10.1 mg/dL (ref 8.7–10.3)
Chloride: 100 mmol/L (ref 96–106)
Creatinine, Ser: 0.9 mg/dL (ref 0.57–1.00)
GFR calc Af Amer: 73 mL/min/{1.73_m2} (ref >59)
GFR calc non Af Amer: 63 mL/min/{1.73_m2} (ref >59)
Globulin, Total: 3.4 g/dL (ref 1.5–4.5)
Glucose: 91 mg/dL (ref 65–99)
Potassium: 4.1 mmol/L (ref 3.5–5.2)
Sodium: 141 mmol/L (ref 134–144)
Total Protein: 7.9 g/dL (ref 6.0–8.5)

## 2020-01-01 LAB — VITAMIN B12: Vitamin B-12: 421 pg/mL (ref 232–1245)

## 2020-01-01 LAB — CBC
Hematocrit: 39 % (ref 34.0–46.6)
Hemoglobin: 13.1 g/dL (ref 11.1–15.9)
MCH: 29.8 pg (ref 26.6–33.0)
MCHC: 33.6 g/dL (ref 31.5–35.7)
MCV: 89 fL (ref 79–97)
Platelets: 386 10*3/uL (ref 150–450)
RBC: 4.4 x10E6/uL (ref 3.77–5.28)
RDW: 13.1 % (ref 11.7–15.4)
WBC: 4.8 10*3/uL (ref 3.4–10.8)

## 2020-01-01 LAB — LIPID PANEL
Chol/HDL Ratio: 3.1 ratio (ref 0.0–4.4)
Cholesterol, Total: 157 mg/dL (ref 100–199)
HDL: 51 mg/dL (ref >39)
LDL Chol Calc (NIH): 91 mg/dL (ref 0–99)
Triglycerides: 81 mg/dL (ref 0–149)
VLDL Cholesterol Cal: 15 mg/dL (ref 5–40)

## 2020-01-01 LAB — VITAMIN D 25 HYDROXY (VIT D DEFICIENCY, FRACTURES): Vit D, 25-Hydroxy: 53.2 ng/mL (ref 30.0–100.0)

## 2020-01-06 ENCOUNTER — Other Ambulatory Visit: Payer: Medicare HMO

## 2020-01-06 ENCOUNTER — Other Ambulatory Visit: Payer: Self-pay

## 2020-01-06 DIAGNOSIS — Z20822 Contact with and (suspected) exposure to covid-19: Secondary | ICD-10-CM | POA: Diagnosis not present

## 2020-01-07 ENCOUNTER — Encounter: Payer: Self-pay | Admitting: Internal Medicine

## 2020-01-07 LAB — NOVEL CORONAVIRUS, NAA: SARS-CoV-2, NAA: NOT DETECTED

## 2020-01-07 LAB — SARS-COV-2, NAA 2 DAY TAT

## 2020-01-18 ENCOUNTER — Telehealth: Payer: Self-pay

## 2020-01-18 NOTE — Telephone Encounter (Signed)
  Left the pt a message to call back.   Dr. Baird Cancer said it has been suggested she have breast MRI due to breast cancer risk. Does she want to get MRI and THEN see breast cancer specialist or does she want to see speicalist first? (I am asking b/c she is considered high risk due to family history)

## 2020-01-19 ENCOUNTER — Telehealth: Payer: Self-pay

## 2020-01-19 NOTE — Telephone Encounter (Signed)
Dr. Baird Cancer said it has been suggested she have breast MRI due to breast cancer risk. Does she want to get MRI and THEN see breast cancer specialist or does she want to see speicalist first? (I am asking b/c she is considered high risk due to family history)  The patient said yes that she would like to have an MRI done and that she wants Dr. Baird Cancer to know that she got an appt with Dr. Donne Hazel November 9th and that it's a breast cancer clinic.

## 2020-02-09 DIAGNOSIS — Z9189 Other specified personal risk factors, not elsewhere classified: Secondary | ICD-10-CM | POA: Diagnosis not present

## 2020-02-09 DIAGNOSIS — Z803 Family history of malignant neoplasm of breast: Secondary | ICD-10-CM | POA: Diagnosis not present

## 2020-02-10 ENCOUNTER — Encounter: Payer: Self-pay | Admitting: Internal Medicine

## 2020-02-11 ENCOUNTER — Encounter: Payer: Self-pay | Admitting: Internal Medicine

## 2020-02-17 ENCOUNTER — Other Ambulatory Visit: Payer: Medicare HMO

## 2020-04-11 DIAGNOSIS — Z1152 Encounter for screening for COVID-19: Secondary | ICD-10-CM | POA: Diagnosis not present

## 2020-04-16 DIAGNOSIS — Z1152 Encounter for screening for COVID-19: Secondary | ICD-10-CM | POA: Diagnosis not present

## 2020-05-24 ENCOUNTER — Encounter: Payer: Self-pay | Admitting: Internal Medicine

## 2020-05-24 ENCOUNTER — Telehealth: Payer: Self-pay

## 2020-05-24 NOTE — Telephone Encounter (Signed)
The pt called and said that she was returning a call to the office and that they didn't leave a name. I told the pt that I don't see who it was that called her.

## 2020-06-23 DIAGNOSIS — M5416 Radiculopathy, lumbar region: Secondary | ICD-10-CM | POA: Diagnosis not present

## 2020-06-23 DIAGNOSIS — M5136 Other intervertebral disc degeneration, lumbar region: Secondary | ICD-10-CM | POA: Diagnosis not present

## 2020-06-23 DIAGNOSIS — M545 Low back pain, unspecified: Secondary | ICD-10-CM | POA: Diagnosis not present

## 2020-06-29 ENCOUNTER — Ambulatory Visit: Payer: Medicare HMO | Admitting: Internal Medicine

## 2020-06-30 ENCOUNTER — Other Ambulatory Visit: Payer: Self-pay | Admitting: Internal Medicine

## 2020-06-30 DIAGNOSIS — M5416 Radiculopathy, lumbar region: Secondary | ICD-10-CM | POA: Diagnosis not present

## 2020-07-07 DIAGNOSIS — M545 Low back pain, unspecified: Secondary | ICD-10-CM | POA: Diagnosis not present

## 2020-07-14 DIAGNOSIS — M5416 Radiculopathy, lumbar region: Secondary | ICD-10-CM | POA: Diagnosis not present

## 2020-07-14 DIAGNOSIS — M25559 Pain in unspecified hip: Secondary | ICD-10-CM | POA: Diagnosis not present

## 2020-07-18 ENCOUNTER — Ambulatory Visit (INDEPENDENT_AMBULATORY_CARE_PROVIDER_SITE_OTHER): Payer: Medicare HMO | Admitting: Internal Medicine

## 2020-07-18 ENCOUNTER — Encounter: Payer: Self-pay | Admitting: Internal Medicine

## 2020-07-18 ENCOUNTER — Other Ambulatory Visit: Payer: Self-pay

## 2020-07-18 VITALS — BP 116/74 | HR 55 | Temp 98.4°F | Ht 60.0 in | Wt 167.4 lb

## 2020-07-18 DIAGNOSIS — E6609 Other obesity due to excess calories: Secondary | ICD-10-CM

## 2020-07-18 DIAGNOSIS — N182 Chronic kidney disease, stage 2 (mild): Secondary | ICD-10-CM | POA: Diagnosis not present

## 2020-07-18 DIAGNOSIS — R635 Abnormal weight gain: Secondary | ICD-10-CM | POA: Diagnosis not present

## 2020-07-18 DIAGNOSIS — I129 Hypertensive chronic kidney disease with stage 1 through stage 4 chronic kidney disease, or unspecified chronic kidney disease: Secondary | ICD-10-CM | POA: Diagnosis not present

## 2020-07-18 DIAGNOSIS — Z6832 Body mass index (BMI) 32.0-32.9, adult: Secondary | ICD-10-CM

## 2020-07-18 DIAGNOSIS — M5416 Radiculopathy, lumbar region: Secondary | ICD-10-CM | POA: Diagnosis not present

## 2020-07-18 NOTE — Progress Notes (Signed)
I,Katawbba Wiggins,acting as a Education administrator for [name], MD.,have documented all relevant documentation on the behalf of [name], MD,as directed by  [name], MD while in the presence of [name], MD.  This visit occurred during the SARS-CoV-2 public health emergency.  Safety protocols were in place, including screening questions prior to the visit, additional usage of staff PPE, and extensive cleaning of exam room while observing appropriate contact time as indicated for disinfecting solutions.  Subjective:     Patient ID: Sara Obrien , female    DOB: 1945-07-31 , 75 y.o.   MRN: [id number]   Chief Complaint  Patient presents with  . Hypertension    HPI  She is here today for a blood pressure f/u.  She reports compliance with meds. She denies headaches, chest pain and shortness of breath. She plays pickleball regularly.   Hyperlipidemia This is a chronic problem. The current episode started more than 1 year ago. The problem is controlled. Recent lipid tests were reviewed and are high. Exacerbating diseases include chronic renal disease and obesity. Pertinent negatives include no chest pain or shortness of breath.  Hypertension This is a chronic problem. The current episode started more than 1 year ago. The problem has been gradually improving since onset. The problem is controlled. Pertinent negatives include no blurred vision, chest pain, palpitations or shortness of breath. Risk factors for coronary artery disease include dyslipidemia, obesity, post-menopausal state and sedentary lifestyle. The current treatment provides moderate improvement. Hypertensive end-organ damage includes kidney disease. Identifiable causes of hypertension include chronic renal disease.     Past Medical History:  Diagnosis Date  . Anxiety   . Atypical chest pain 07/01/2017  . Essential hypertension 07/01/2017  . Gum disease    dentures worn  . Hyperlipidemia   . Hypertension   .  Migraines      Family History  Problem Relation Age of Onset  . Brain cancer Mother   . Hypertension Mother   . Stroke Father   . Hypertension Father   . Hypertension Sister   . Cancer Brother   . Cancer Brother   . Cancer Brother   . Breast cancer Sister      Current Outpatient Medications:  .  acetaminophen (TYLENOL) 500 MG tablet, Take 500 mg by mouth every 6 (six) hours as needed., Disp: , Rfl:  .  atorvastatin (LIPITOR) 20 MG tablet, Take one tablet by mouth daily., Disp: 90 tablet, Rfl: 2 .  Cholecalciferol 50 MCG (2000 UT) CAPS, Take 1 capsule by mouth daily., Disp: , Rfl:  .  diclofenac (VOLTAREN) 75 MG EC tablet, Take 75 mg by mouth 2 (two) times daily as needed., Disp: , Rfl:  .  escitalopram (LEXAPRO) 20 MG tablet, Take 1 tablet (20 mg total) by mouth daily., Disp: 90 tablet, Rfl: 2 .  gabapentin (NEURONTIN) 300 MG capsule, Take by mouth., Disp: , Rfl:  .  Magnesium 250 MG TABS, Take by mouth. 1 daily, Disp: , Rfl:  .  olmesartan-hydrochlorothiazide (BENICAR HCT) 40-12.5 MG tablet, TAKE 1 TABLET EVERY DAY, Disp: 90 tablet, Rfl: 2 .  Omega-3 Fatty Acids (OMEGA 3 500 PO), Take by mouth., Disp: , Rfl:  .  TURMERIC PO, Take 1,300 mg by mouth daily., Disp: , Rfl:    No Known Allergies   Review of Systems  Constitutional: Negative.   Eyes: Negative for blurred vision.  Respiratory: Negative.  Negative for shortness of breath.   Cardiovascular: Negative.  Negative for chest pain and palpitations.  Gastrointestinal: Negative.   Psychiatric/Behavioral: Negative.   All other systems reviewed and are negative.    Today's Vitals   07/18/20 1124  BP: 116/74  Pulse: (!) 55  Temp: 98.4 F (36.9 C)  TempSrc: Oral  Weight: 167 lb 6.4 oz (75.9 kg)  Height: 5' (1.524 m)  PainSc: 4   PainLoc: Leg   Body mass index is 32.69 kg/m.  Wt Readings from Last 3 Encounters:  07/18/20 167 lb 6.4 oz (75.9 kg)  12/31/19 161 lb (73 kg)  12/31/19 161 lb (73 kg)   Objective:   Physical Exam Vitals and nursing note reviewed.  Constitutional:      Appearance: Normal appearance. She is obese.  HENT:     Head: Normocephalic and atraumatic.     Nose:     Comments: Masked     Mouth/Throat:     Comments: Masked  Cardiovascular:     Rate and Rhythm: Normal rate and regular rhythm.     Heart sounds: Normal heart sounds.  Pulmonary:     Effort: Pulmonary effort is normal.     Breath sounds: Normal breath sounds.  Musculoskeletal:     Cervical back: Normal range of motion.  Skin:    General: Skin is warm.  Neurological:     General: No focal deficit present.     Mental Status: She is alert.  Psychiatric:        Mood and Affect: Mood normal.        Behavior: Behavior normal.         Assessment And Plan:     1. Hypertensive nephropathy Comments: Chronic, well controlled. She is encouraged to avoid adding salt to her foods. I will check renal function today.  - CMP14+EGFR  2. Chronic renal disease, stage II Comments: Chronic, I will check renal function today. Encouraged to keep BP controlled and stay well hydrated to prevent progression of CKD.   3. Weight gain Comments: She is aware of 6 pound weight gain. I will check an a1c today.  She is encouraged to cut back on her intake of sweetened beverages, including diet drinks.  - Hemoglobin A1c - TSH  4. Lumbar radiculopathy Comments: She has been eval by Ortho/Neurosurgery. Encouraged to perform stretching exercises. She declines chiro evaluation at this time.  5. Class 1 obesity due to excess calories with serious comorbidity and body mass index (BMI) of 32.0 to 32.9 in adult  She is encouraged to strive for BMI less than 30 to decrease cardiac risk. Advised to aim for at least 150 minutes of exercise per week.   Patient was given opportunity to ask questions. Patient verbalized understanding of the plan and was able to repeat key elements of the plan. All questions were answered to their  satisfaction.    I, [name], MD, have reviewed all documentation for this visit. The documentation on 07/18/20 for the exam, diagnosis, procedures, and orders are all accurate and complete.   IF YOU HAVE BEEN REFERRED TO A SPECIALIST, IT MAY TAKE 1-2 WEEKS TO SCHEDULE/PROCESS THE REFERRAL. IF YOU HAVE NOT HEARD FROM US/SPECIALIST IN TWO WEEKS, PLEASE GIVE [place] A CALL AT [phone].   THE PATIENT IS ENCOURAGED TO PRACTICE SOCIAL DISTANCING DUE TO THE COVID-19 PANDEMIC.

## 2020-07-18 NOTE — Patient Instructions (Signed)

## 2020-07-19 LAB — CMP14+EGFR
ALT: 44 IU/L — ABNORMAL HIGH (ref 0–32)
AST: 32 IU/L (ref 0–40)
Albumin/Globulin Ratio: 1.1 — ABNORMAL LOW (ref 1.2–2.2)
Albumin: 4.2 g/dL (ref 3.7–4.7)
Alkaline Phosphatase: 82 IU/L (ref 44–121)
BUN/Creatinine Ratio: 19 (ref 12–28)
BUN: 18 mg/dL (ref 8–27)
Bilirubin Total: 0.3 mg/dL (ref 0.0–1.2)
CO2: 25 mmol/L (ref 20–29)
Calcium: 9.8 mg/dL (ref 8.7–10.3)
Chloride: 103 mmol/L (ref 96–106)
Creatinine, Ser: 0.94 mg/dL (ref 0.57–1.00)
Globulin, Total: 3.7 g/dL (ref 1.5–4.5)
Glucose: 87 mg/dL (ref 65–99)
Potassium: 4.6 mmol/L (ref 3.5–5.2)
Sodium: 143 mmol/L (ref 134–144)
Total Protein: 7.9 g/dL (ref 6.0–8.5)
eGFR: 64 mL/min/{1.73_m2} (ref >59)

## 2020-07-19 LAB — HEMOGLOBIN A1C
Est. average glucose Bld gHb Est-mCnc: 120 mg/dL
Hgb A1c MFr Bld: 5.8 % — ABNORMAL HIGH (ref 4.8–5.6)

## 2020-07-19 LAB — TSH: TSH: 0.854 u[IU]/mL (ref 0.450–4.500)

## 2020-07-27 DIAGNOSIS — M25552 Pain in left hip: Secondary | ICD-10-CM | POA: Diagnosis not present

## 2020-07-27 DIAGNOSIS — M25559 Pain in unspecified hip: Secondary | ICD-10-CM | POA: Diagnosis not present

## 2020-07-27 DIAGNOSIS — M5416 Radiculopathy, lumbar region: Secondary | ICD-10-CM | POA: Diagnosis not present

## 2020-07-29 ENCOUNTER — Telehealth: Payer: Self-pay

## 2020-07-29 MED ORDER — ESCITALOPRAM OXALATE 20 MG PO TABS: 20.0000 mg | ORAL_TABLET | Freq: Every day | ORAL | 2 refills | Status: DC

## 2020-07-29 NOTE — Telephone Encounter (Signed)
I left a message that Dr. Baird Cancer wanted to know if the pt has been taking diclofenac sodium tablets daily because if she is it's not good for her kidneys.

## 2020-08-05 ENCOUNTER — Other Ambulatory Visit: Payer: Self-pay

## 2020-08-05 MED ORDER — ESCITALOPRAM OXALATE 20 MG PO TABS: 20.0000 mg | ORAL_TABLET | Freq: Every day | ORAL | 2 refills | Status: DC

## 2020-08-05 MED ORDER — GABAPENTIN 300 MG PO CAPS: 300.0000 mg | ORAL_CAPSULE | Freq: Three times a day (TID) | ORAL | 1 refills | Status: DC

## 2020-08-21 IMAGING — CR DG KNEE COMPLETE 4+V*L*
4 series · 4 of 4 positions shown · non-contrast
Comparison: None.

CLINICAL DATA: Knee pain for several days, no known injury

EXAM:
LEFT KNEE - COMPLETE 4+ VIEW

[t knee ap left]
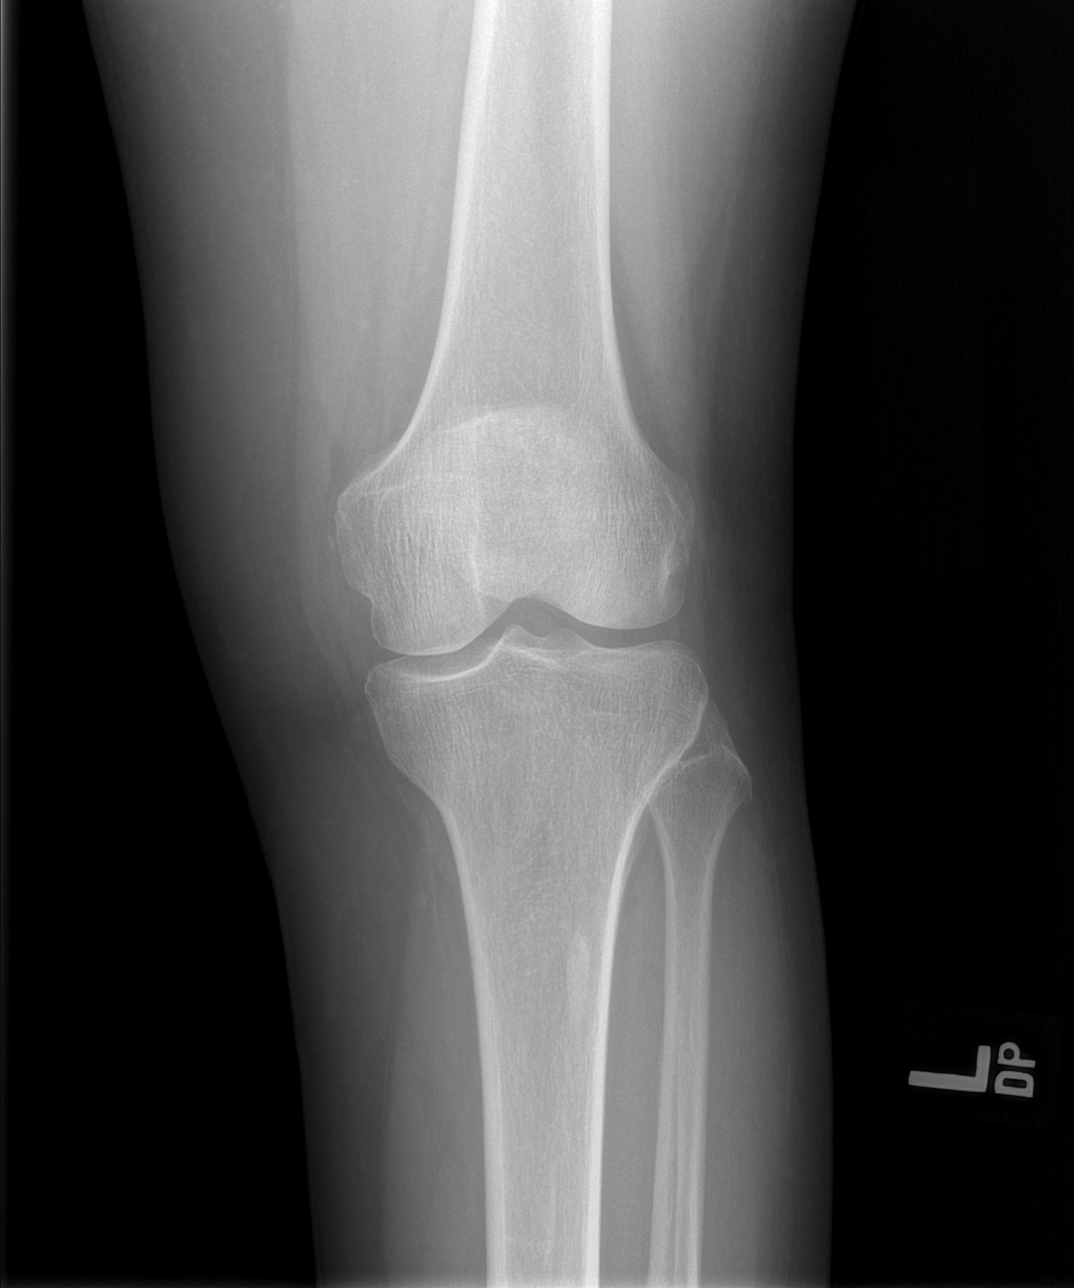

[t knee oblique left (1 of 2)]
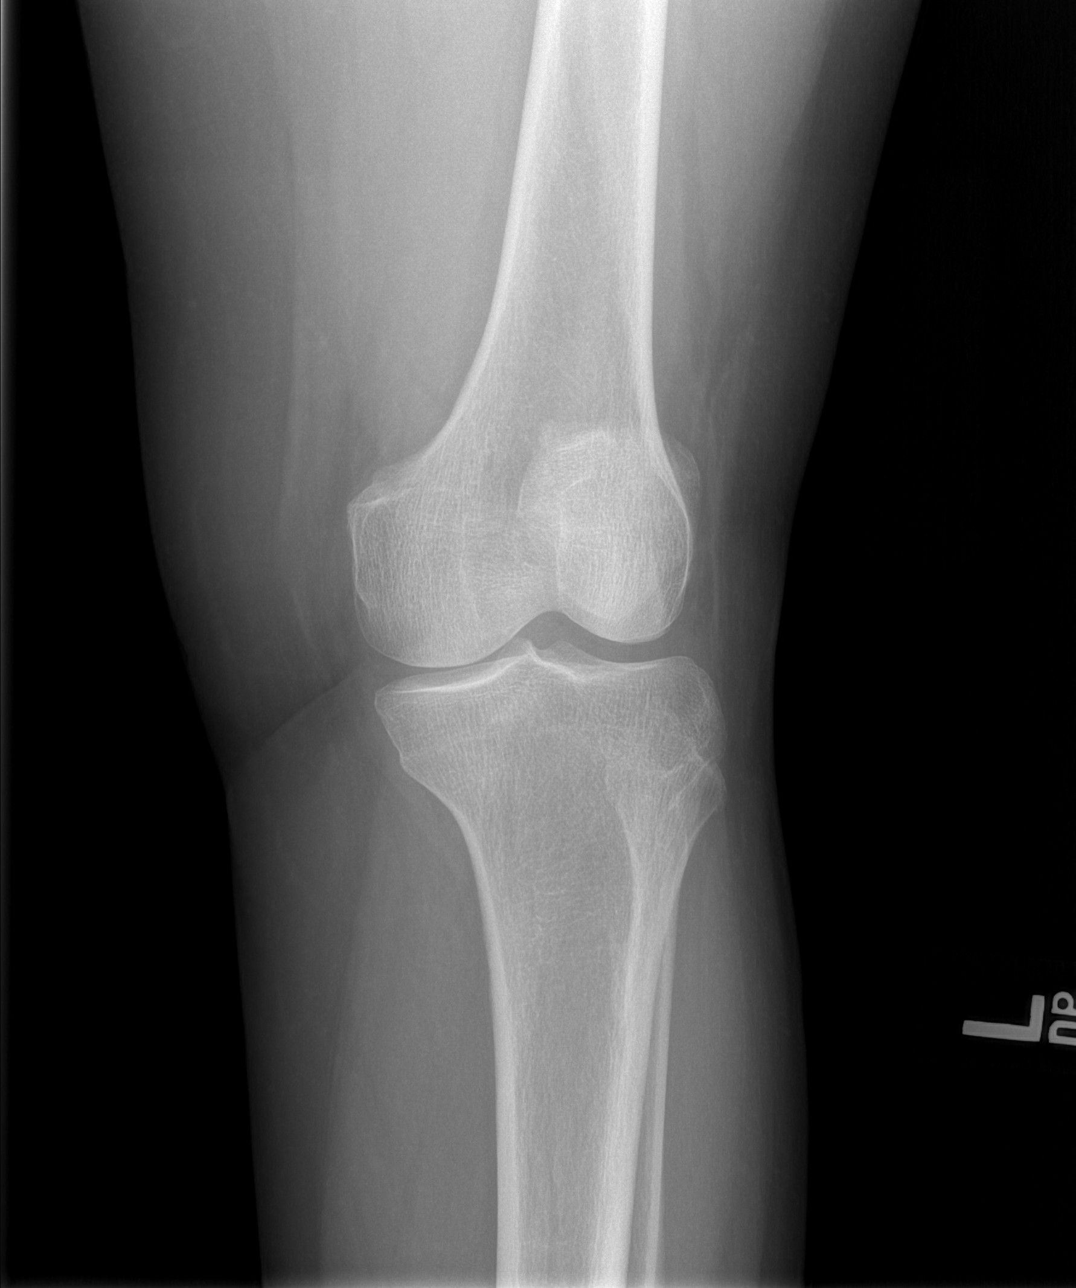

[t knee oblique left (2 of 2)]
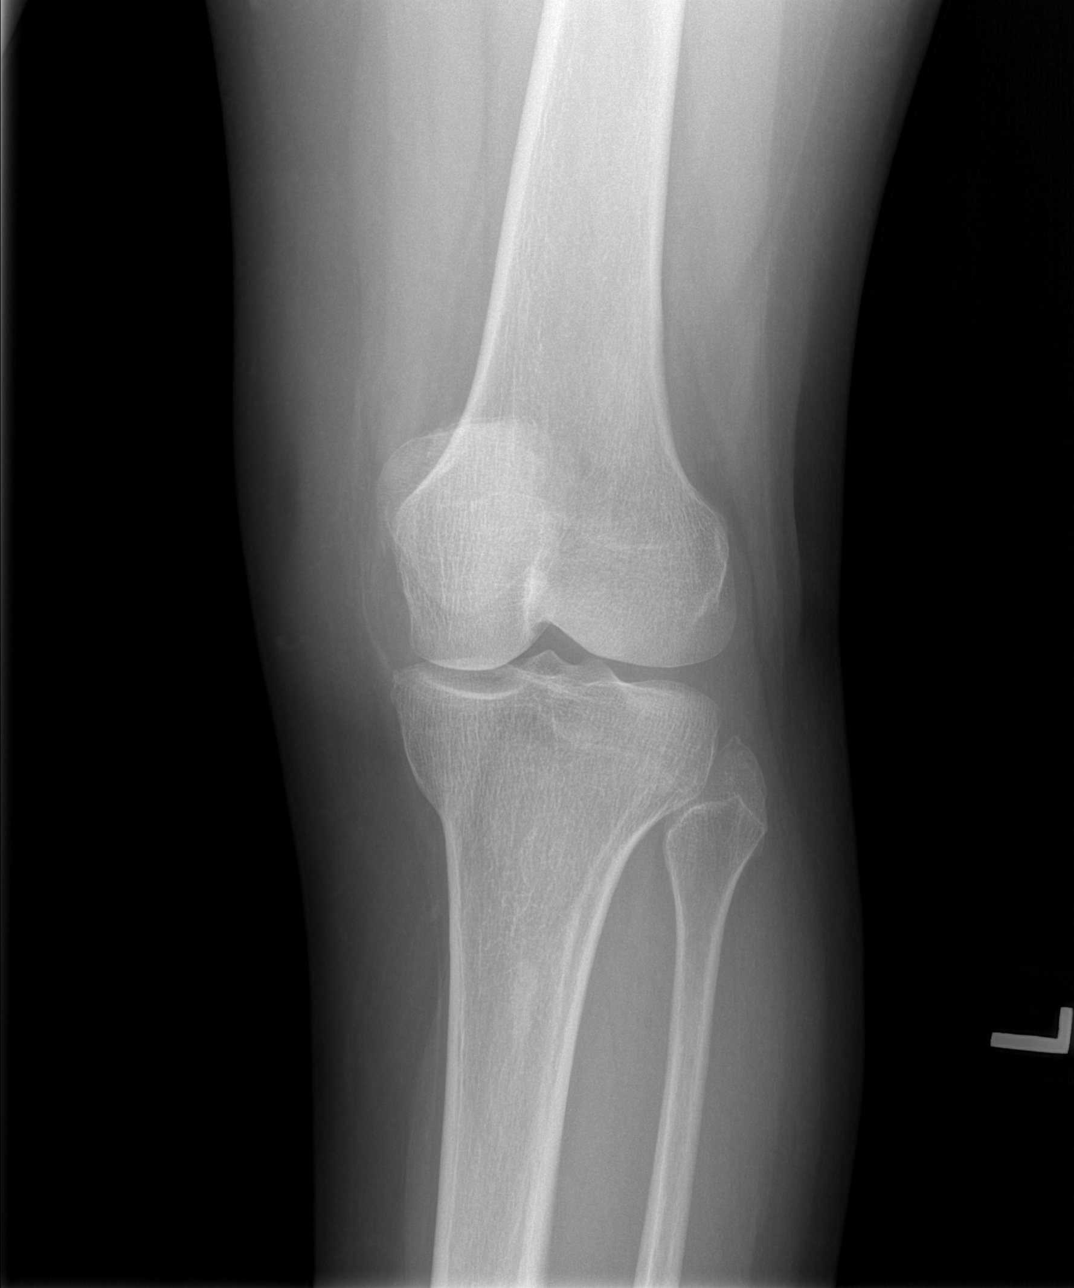

[t knee lat left]
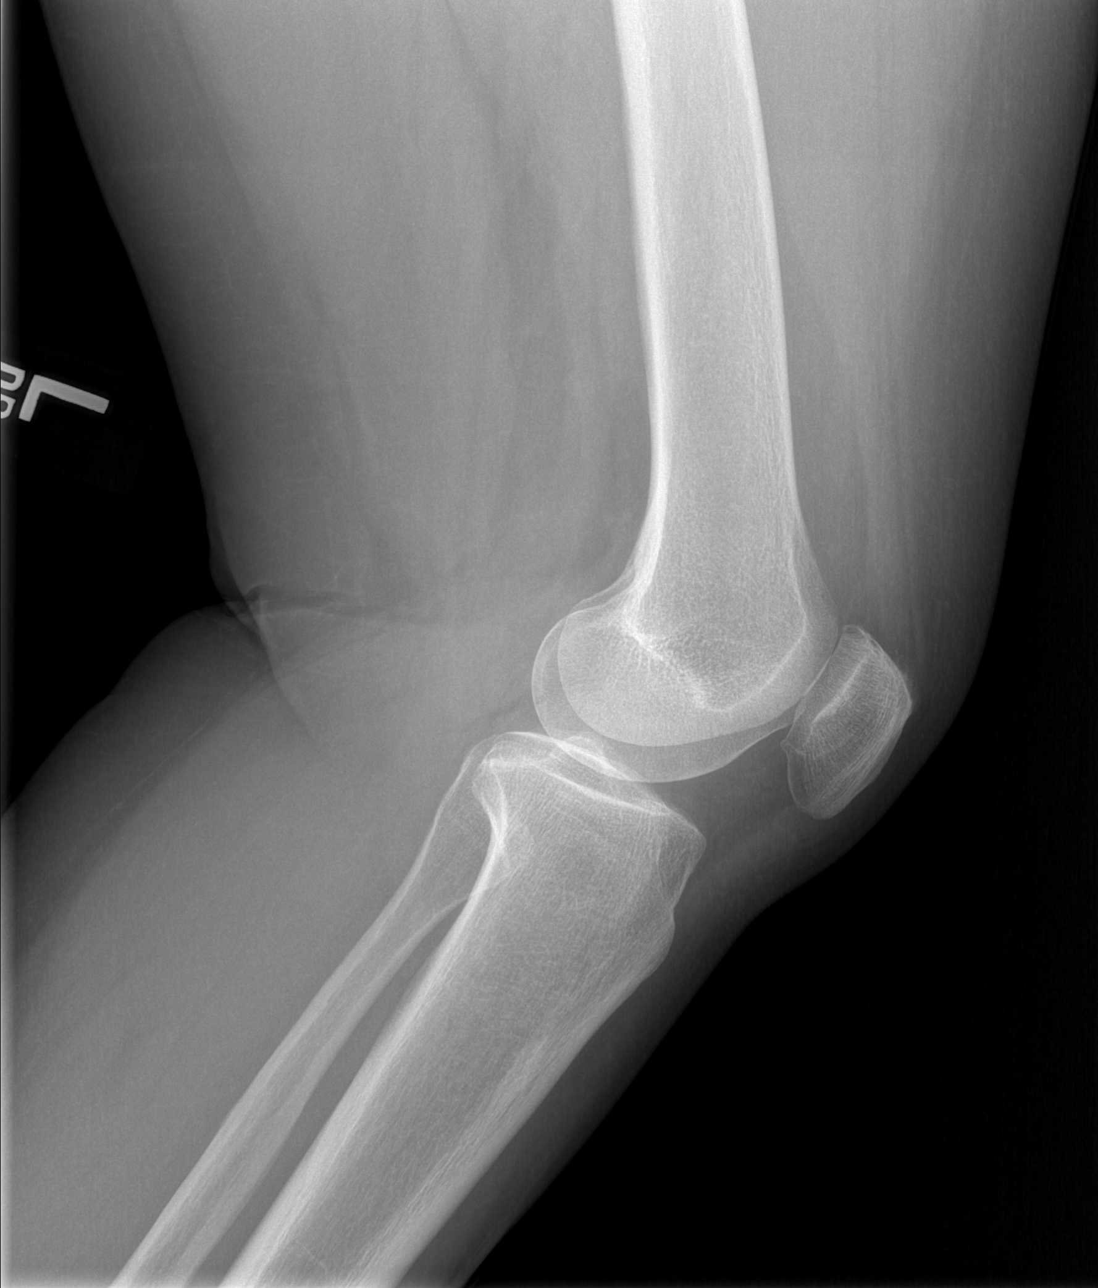

[4 of 4 positions shown; findings below may reference images not displayed]

FINDINGS: No fracture or dislocation of the left knee. Joint spaces are well
preserved. No knee joint effusion. Soft tissues are unremarkable.
Incidental small bone island of the proximal left tibial
metadiaphysis.
IMPRESSION: No fracture or dislocation of the left knee. Joint spaces are well
preserved. No knee joint effusion.

## 2020-11-28 DIAGNOSIS — J0100 Acute maxillary sinusitis, unspecified: Secondary | ICD-10-CM | POA: Diagnosis not present

## 2020-11-28 DIAGNOSIS — R5383 Other fatigue: Secondary | ICD-10-CM | POA: Diagnosis not present

## 2020-11-28 DIAGNOSIS — Z20822 Contact with and (suspected) exposure to covid-19: Secondary | ICD-10-CM | POA: Diagnosis not present

## 2020-11-28 DIAGNOSIS — R059 Cough, unspecified: Secondary | ICD-10-CM | POA: Diagnosis not present

## 2020-12-02 DIAGNOSIS — Z1231 Encounter for screening mammogram for malignant neoplasm of breast: Secondary | ICD-10-CM | POA: Diagnosis not present

## 2020-12-02 LAB — HM MAMMOGRAPHY: HM Mammogram: NORMAL (ref 0–4)

## 2020-12-07 ENCOUNTER — Encounter: Payer: Self-pay | Admitting: Internal Medicine

## 2020-12-07 LAB — HM MAMMOGRAPHY: HM Mammogram: NORMAL (ref 0–4)

## 2020-12-15 DIAGNOSIS — M810 Age-related osteoporosis without current pathological fracture: Secondary | ICD-10-CM | POA: Diagnosis not present

## 2020-12-15 DIAGNOSIS — Z780 Asymptomatic menopausal state: Secondary | ICD-10-CM | POA: Diagnosis not present

## 2020-12-15 LAB — HM DEXA SCAN

## 2020-12-19 ENCOUNTER — Encounter: Payer: Self-pay | Admitting: Internal Medicine

## 2020-12-27 ENCOUNTER — Encounter: Payer: Self-pay | Admitting: Internal Medicine

## 2020-12-29 ENCOUNTER — Other Ambulatory Visit: Payer: Self-pay

## 2020-12-29 ENCOUNTER — Ambulatory Visit (INDEPENDENT_AMBULATORY_CARE_PROVIDER_SITE_OTHER): Payer: Medicare HMO

## 2020-12-29 VITALS — BP 130/80 | HR 72 | Temp 98.0°F | Ht 60.4 in | Wt 154.6 lb

## 2020-12-29 DIAGNOSIS — Z0000 Encounter for general adult medical examination without abnormal findings: Secondary | ICD-10-CM | POA: Diagnosis not present

## 2020-12-29 DIAGNOSIS — I10 Essential (primary) hypertension: Secondary | ICD-10-CM | POA: Diagnosis not present

## 2020-12-29 DIAGNOSIS — I129 Hypertensive chronic kidney disease with stage 1 through stage 4 chronic kidney disease, or unspecified chronic kidney disease: Secondary | ICD-10-CM

## 2020-12-29 LAB — CBC WITH DIFFERENTIAL/PLATELET
Basophils Absolute: 0.1 10*3/uL (ref 0.0–0.2)
Basos: 1 %
EOS (ABSOLUTE): 0.2 10*3/uL (ref 0.0–0.4)
Eos: 3 %
Hematocrit: 40.3 % (ref 34.0–46.6)
Hemoglobin: 13.3 g/dL (ref 11.1–15.9)
Immature Grans (Abs): 0 10*3/uL (ref 0.0–0.1)
Immature Granulocytes: 0 %
Lymphocytes Absolute: 1.9 10*3/uL (ref 0.7–3.1)
Lymphs: 37 %
MCH: 29.3 pg (ref 26.6–33.0)
MCHC: 33 g/dL (ref 31.5–35.7)
MCV: 89 fL (ref 79–97)
Monocytes Absolute: 0.6 10*3/uL (ref 0.1–0.9)
Monocytes: 11 %
Neutrophils Absolute: 2.4 10*3/uL (ref 1.4–7.0)
Neutrophils: 48 %
Platelets: 406 10*3/uL (ref 150–450)
RBC: 4.54 x10E6/uL (ref 3.77–5.28)
RDW: 13.8 % (ref 11.7–15.4)
WBC: 5.1 10*3/uL (ref 3.4–10.8)

## 2020-12-29 LAB — POCT URINALYSIS DIPSTICK
Bilirubin, UA: NEGATIVE
Glucose, UA: NEGATIVE
Ketones, UA: NEGATIVE
Nitrite, UA: NEGATIVE
Protein, UA: NEGATIVE
Spec Grav, UA: 1.02 (ref 1.010–1.025)
Urobilinogen, UA: 0.2 E.U./dL
pH, UA: 5.5 (ref 5.0–8.0)

## 2020-12-29 LAB — POCT UA - MICROALBUMIN
Albumin/Creatinine Ratio, Urine, POC: <30
Creatinine, POC: 300 mg/dL
Microalbumin Ur, POC: 30 mg/L

## 2020-12-29 NOTE — Patient Instructions (Signed)
Sara Obrien , Thank you for taking time to come for your Medicare Wellness Visit. I appreciate your ongoing commitment to your health goals. Please review the following plan we discussed and let me know if I can assist you in the future.   Screening recommendations/referrals: Colonoscopy: completed 04/14/2015 Mammogram: completed 12/02/2020 Bone Density: completed 12/15/2020 Recommended yearly ophthalmology/optometry visit for glaucoma screening and checkup Recommended yearly dental visit for hygiene and checkup  Vaccinations: Influenza vaccine: completed 12/13/2020 Pneumococcal vaccine: completed per patient Tdap vaccine: completed 09/22/2013, due 09/23/2023 Shingles vaccine: discussed   Covid-19: 07/22/2020, 02/13/2020, 06/02/2019, 05/08/2019  Advanced directives: Please bring a copy of your POA (Power of Attorney) and/or Living Will to your next appointment.   Conditions/risks identified: none  Next appointment: Follow up in one year for your annual wellness visit    Preventive Care 65 Years and Older, Female Preventive care refers to lifestyle choices and visits with your health care provider that can promote health and wellness. What does preventive care include? A yearly physical exam. This is also called an annual well check. Dental exams once or twice a year. Routine eye exams. Ask your health care provider how often you should have your eyes checked. Personal lifestyle choices, including: Daily care of your teeth and gums. Regular physical activity. Eating a healthy diet. Avoiding tobacco and drug use. Limiting alcohol use. Practicing safe sex. Taking low-dose aspirin every day. Taking vitamin and mineral supplements as recommended by your health care provider. What happens during an annual well check? The services and screenings done by your health care provider during your annual well check will depend on your age, overall health, lifestyle risk factors, and family history of  disease. Counseling  Your health care provider may ask you questions about your: Alcohol use. Tobacco use. Drug use. Emotional well-being. Home and relationship well-being. Sexual activity. Eating habits. History of falls. Memory and ability to understand (cognition). Work and work Statistician. Reproductive health. Screening  You may have the following tests or measurements: Height, weight, and BMI. Blood pressure. Lipid and cholesterol levels. These may be checked every 5 years, or more frequently if you are over 4 years old. Skin check. Lung cancer screening. You may have this screening every year starting at age 62 if you have a 30-pack-year history of smoking and currently smoke or have quit within the past 15 years. Fecal occult blood test (FOBT) of the stool. You may have this test every year starting at age 40. Flexible sigmoidoscopy or colonoscopy. You may have a sigmoidoscopy every 5 years or a colonoscopy every 10 years starting at age 81. Hepatitis C blood test. Hepatitis B blood test. Sexually transmitted disease (STD) testing. Diabetes screening. This is done by checking your blood sugar (glucose) after you have not eaten for a while (fasting). You may have this done every 1-3 years. Bone density scan. This is done to screen for osteoporosis. You may have this done starting at age 14. Mammogram. This may be done every 1-2 years. Talk to your health care provider about how often you should have regular mammograms. Talk with your health care provider about your test results, treatment options, and if necessary, the need for more tests. Vaccines  Your health care provider may recommend certain vaccines, such as: Influenza vaccine. This is recommended every year. Tetanus, diphtheria, and acellular pertussis (Tdap, Td) vaccine. You may need a Td booster every 10 years. Zoster vaccine. You may need this after age 84. Pneumococcal 13-valent conjugate (PCV13) vaccine.  One  dose is recommended after age 6. Pneumococcal polysaccharide (PPSV23) vaccine. One dose is recommended after age 77. Talk to your health care provider about which screenings and vaccines you need and how often you need them. This information is not intended to replace advice given to you by your health care provider. Make sure you discuss any questions you have with your health care provider. Document Released: 04/15/2015 Document Revised: 12/07/2015 Document Reviewed: 01/18/2015 Elsevier Interactive Patient Education  2017 [place] Prevention in the Home Falls can cause injuries. They can happen to people of all ages. There are many things you can do to make your home safe and to help prevent falls. What can I do on the outside of my home? Regularly fix the edges of walkways and driveways and fix any cracks. Remove anything that might make you trip as you walk through a door, such as a raised step or threshold. Trim any bushes or trees on the path to your home. Use bright outdoor lighting. Clear any walking paths of anything that might make someone trip, such as rocks or tools. Regularly check to see if handrails are loose or broken. Make sure that both sides of any steps have handrails. Any raised decks and porches should have guardrails on the edges. Have any leaves, snow, or ice cleared regularly. Use sand or salt on walking paths during winter. Clean up any spills in your garage right away. This includes oil or grease spills. What can I do in the bathroom? Use night lights. Install grab bars by the toilet and in the tub and shower. Do not use towel bars as grab bars. Use non-skid mats or decals in the tub or shower. If you need to sit down in the shower, use a plastic, non-slip stool. Keep the floor dry. Clean up any water that spills on the floor as soon as it happens. Remove soap buildup in the tub or shower regularly. Attach bath mats securely with double-sided  non-slip rug tape. Do not have throw rugs and other things on the floor that can make you trip. What can I do in the bedroom? Use night lights. Make sure that you have a light by your bed that is easy to reach. Do not use any sheets or blankets that are too big for your bed. They should not hang down onto the floor. Have a firm chair that has side arms. You can use this for support while you get dressed. Do not have throw rugs and other things on the floor that can make you trip. What can I do in the kitchen? Clean up any spills right away. Avoid walking on wet floors. Keep items that you use a lot in easy-to-reach places. If you need to reach something above you, use a strong step stool that has a grab bar. Keep electrical cords out of the way. Do not use floor polish or wax that makes floors slippery. If you must use wax, use non-skid floor wax. Do not have throw rugs and other things on the floor that can make you trip. What can I do with my stairs? Do not leave any items on the stairs. Make sure that there are handrails on both sides of the stairs and use them. Fix handrails that are broken or loose. Make sure that handrails are as long as the stairways. Check any carpeting to make sure that it is firmly attached to the stairs. Fix any carpet that is loose or  worn. Avoid having throw rugs at the top or bottom of the stairs. If you do have throw rugs, attach them to the floor with carpet tape. Make sure that you have a light switch at the top of the stairs and the bottom of the stairs. If you do not have them, ask someone to add them for you. What else can I do to help prevent falls? Wear shoes that: Do not have high heels. Have rubber bottoms. Are comfortable and fit you well. Are closed at the toe. Do not wear sandals. If you use a stepladder: Make sure that it is fully opened. Do not climb a closed stepladder. Make sure that both sides of the stepladder are locked into place. Ask  someone to hold it for you, if possible. Clearly mark and make sure that you can see: Any grab bars or handrails. First and last steps. Where the edge of each step is. Use tools that help you move around (mobility aids) if they are needed. These include: Canes. Walkers. Scooters. Crutches. Turn on the lights when you go into a dark area. Replace any light bulbs as soon as they burn out. Set up your furniture so you have a clear path. Avoid moving your furniture around. If any of your floors are uneven, fix them. If there are any pets around you, be aware of where they are. Review your medicines with your doctor. Some medicines can make you feel dizzy. This can increase your chance of falling. Ask your doctor what other things that you can do to help prevent falls. This information is not intended to replace advice given to you by your health care provider. Make sure you discuss any questions you have with your health care provider. Document Released: 01/13/2009 Document Revised: 08/25/2015 Document Reviewed: 04/23/2014 Elsevier Interactive Patient Education  2017 [employer].

## 2020-12-29 NOTE — Addendum Note (Signed)
Addended by: Kellie Simmering on: 12/29/2020 10:36 AM   Modules accepted: Orders

## 2020-12-29 NOTE — Progress Notes (Signed)
This visit occurred during the SARS-CoV-2 public health emergency.  Safety protocols were in place, including screening questions prior to the visit, additional usage of staff PPE, and extensive cleaning of exam room while observing appropriate contact time as indicated for disinfecting solutions.  Subjective:   Sara Obrien is a 75 y.o. female who presents for Medicare Annual (Subsequent) preventive examination.  Review of Systems     Cardiac Risk Factors include: advanced age (>54men, >27 women);hypertension;sedentary lifestyle     Objective:    Today's Vitals   12/29/20 0830 12/29/20 0834  BP: 130/80   Pulse: 72   Temp: 98 F (36.7 C)   TempSrc: Oral   SpO2: 98%   Weight: 154 lb 9.6 oz (70.1 kg)   Height: 5' 0.4" (1.534 m)   PainSc:  5    Body mass index is 29.79 kg/m.  Advanced Directives 12/29/2020 12/31/2019 12/16/2018  Does Patient Have a Medical Advance Directive? Yes No No  Type of Advance Directive Living will - -  Would patient like information on creating a medical advance directive? - No - Patient declined -    Current Medications (verified) Outpatient Encounter Medications as of 12/29/2020  Medication Sig   acetaminophen (TYLENOL) 500 MG tablet Take 500 mg by mouth every 6 (six) hours as needed.   atorvastatin (LIPITOR) 20 MG tablet Take one tablet by mouth daily.   diclofenac (VOLTAREN) 75 MG EC tablet Take 75 mg by mouth 2 (two) times daily as needed.   escitalopram (LEXAPRO) 20 MG tablet Take 1 tablet (20 mg total) by mouth daily.   olmesartan-hydrochlorothiazide (BENICAR HCT) 40-12.5 MG tablet TAKE 1 TABLET EVERY DAY   Omega-3 Fatty Acids (OMEGA 3 500 PO) Take by mouth.   TURMERIC PO Take 1,300 mg by mouth daily.   Cholecalciferol 50 MCG (2000 UT) CAPS Take 1 capsule by mouth daily. (Patient not taking: Reported on 12/29/2020)   gabapentin (NEURONTIN) 300 MG capsule Take 1 capsule (300 mg total) by mouth 3 (three) times daily. (Patient not taking:  Reported on 12/29/2020)   Magnesium 250 MG TABS Take by mouth. 1 daily (Patient not taking: Reported on 12/29/2020)   No facility-administered encounter medications on file as of 12/29/2020.    Allergies (verified) Patient has no known allergies.   History: Past Medical History:  Diagnosis Date   Anxiety    Atypical chest pain 07/01/2017   Essential hypertension 07/01/2017   Gum disease    dentures worn   Hyperlipidemia    Hypertension    Migraines    Past Surgical History:  Procedure Laterality Date   BREAST LUMPECTOMY     TUBAL LIGATION     Family History  Problem Relation Age of Onset   Brain cancer Mother    Hypertension Mother    Stroke Father    Hypertension Father    Hypertension Sister    Cancer Brother    Cancer Brother    Cancer Brother    Breast cancer Sister    Social History   Socioeconomic History   Marital status: Married    Spouse name: Not on file   Number of children: Not on file   Years of education: Not on file   Highest education level: Not on file  Occupational History   Occupation: semi- retired  Tobacco Use   Smoking status: Never   Smokeless tobacco: Never  Vaping Use   Vaping Use: Never used  Substance and Sexual Activity   Alcohol use: Yes  Comment: glass of wine   Drug use: No   Sexual activity: Not Currently    Birth control/protection: Abstinence  Other Topics Concern   Not on file  Social History Narrative   Not on file   Social Determinants of Health   Financial Resource Strain: Low Risk    Difficulty of Paying Living Expenses: Not hard at all  Food Insecurity: No Food Insecurity   Worried About Charity fundraiser in the Last Year: Never true   [place] in the Last Year: Never true  Transportation Needs: No Transportation Needs   Lack of Transportation (Medical): No   Lack of Transportation (Non-Medical): No  Physical Activity: Inactive   Days of Exercise per Week: 0 days   Minutes of Exercise per Session:  0 min  Stress: No Stress Concern Present   Feeling of Stress : Only a little  Social Connections: Not on file    Tobacco Counseling Counseling given: Not Answered   Clinical Intake:  Pre-visit preparation completed: Yes  Pain : 0-10 Pain Score: 5  Pain Type: Chronic pain Pain Location:  (left side) Pain Orientation: Left Pain Descriptors / Indicators: Aching Pain Onset: More than a month ago Pain Frequency: Constant     Nutritional Status: BMI 25 -29 Overweight Nutritional Risks: None Diabetes: No  How often do you need to have someone help you when you read instructions, pamphlets, or other written materials from your doctor or pharmacy?: 1 - Never What is the last grade level you completed in school?: masters degree  Diabetic? no  Interpreter Needed?: No  Information entered by :: NAllen LPN   Activities of Daily Living In your present state of health, do you have any difficulty performing the following activities: 12/29/2020 12/31/2019  Hearing? N N  Vision? N N  Difficulty concentrating or making decisions? N N  Walking or climbing stairs? N N  Dressing or bathing? N N  Doing errands, shopping? N N  Preparing Food and eating ? N N  Using the Toilet? N N  In the past six months, have you accidently leaked urine? N N  Do you have problems with loss of bowel control? N N  Managing your Medications? N N  Managing your Finances? N N  Housekeeping or managing your Housekeeping? N N  Some recent data might be hidden    Patient Care Team: [name], MD as PCP - General (Internal Medicine)  Indicate any recent Medical Services you may have received from other than [facility] providers in the past year (date may be approximate).     Assessment:   This is a routine wellness examination for [facility].  Hearing/Vision screen Vision Screening - Comments:: Regular eye exams,  [facility]  Dietary issues and exercise activities discussed: Current Exercise  Habits: The patient does not participate in regular exercise at present   Goals Addressed             This Visit's Progress    Patient Stated       12/29/2020, wants to weigh 140 pounds       Depression Screen PHQ 2/9 Scores 12/29/2020 12/31/2019 01/26/2019 12/17/2018 12/16/2018 05/08/2018 04/29/2018  PHQ - 2 Score 0 0 0 0 0 3 0  PHQ- 9 Score - - - 0 0 6 -    Fall Risk Fall Risk  12/29/2020 12/31/2019 01/26/2019 12/17/2018 12/16/2018  Falls in the past year? 0 0 0 1 1  Comment - - - -  1 fall; while pickle ball  Number falls in past yr: - - - 0 -  Injury with Fall? - - - 1 1  Comment - - - - injured right rotator cuff  Risk for fall due to : Medication side effect Medication side effect - - History of fall(s)  Follow up Falls evaluation completed;Education provided;Falls prevention discussed Falls evaluation completed;Education provided;Falls prevention discussed - - Falls evaluation completed;Falls prevention discussed    FALL RISK PREVENTION PERTAINING TO THE HOME:  Any stairs in or around the home? Yes  If so, are there any without handrails? No  Home free of loose throw rugs in walkways, pet beds, electrical cords, etc? Yes  Adequate lighting in your home to reduce risk of falls? Yes   ASSISTIVE DEVICES UTILIZED TO PREVENT FALLS:  Life alert? No  Use of a cane, walker or w/c? No  Grab bars in the bathroom? Yes  Shower chair or bench in shower? Yes  Elevated toilet seat or a handicapped toilet? Yes   TIMED UP AND GO:  Was the test performed? No .     Gait steady and fast without use of assistive device  Cognitive Function:     6CIT Screen 12/29/2020 12/31/2019 12/16/2018 12/16/2018  What Year? 0 points 0 points 0 points 0 points  What month? 0 points 0 points 0 points 0 points  What time? 0 points 0 points 0 points 0 points  Count back from 20 0 points 0 points 0 points 0 points  Months in reverse 0 points 0 points 0 points 0 points  Repeat phrase 2 points 2 points 0  points 0 points  Total Score 2 2 0 0    Immunizations Immunization History  Administered Date(s) Administered   Fluad Quad(high Dose 65+) 12/10/2019   Influenza, High Dose Seasonal PF 12/17/2018   PFIZER(Purple Top)SARS-COV-2 Vaccination 05/08/2019, 06/02/2019, 02/13/2020, 07/22/2020   Zoster, Live 07/01/2016    TDAP status: Up to date  Flu Vaccine status: Up to date  Pneumococcal vaccine status: Up to date  Covid-19 vaccine status: Completed vaccines  Qualifies for Shingles Vaccine? Yes   Zostavax completed Yes   Shingrix Completed?: No.    Education has been provided regarding the importance of this vaccine. Patient has been advised to call insurance company to determine out of pocket expense if they have not yet received this vaccine. Advised may also receive vaccine at local pharmacy or Health Dept. Verbalized acceptance and understanding.  Screening Tests Health Maintenance  Topic Date Due   Zoster Vaccines- Shingrix (1 of 2) Never done   INFLUENZA VACCINE  10/31/2020   COVID-19 Vaccine (5 - Booster for [place] series) 11/21/2020   MAMMOGRAM  12/02/2021   TETANUS/TDAP  09/23/2023   COLONOSCOPY (Pts 45-50yrs Insurance coverage will need to be confirmed)  04/13/2025   DEXA SCAN  Completed   Hepatitis C Screening  Completed   HPV VACCINES  Aged Out    Health Maintenance  Health Maintenance Due  Topic Date Due   Zoster Vaccines- Shingrix (1 of 2) Never done   INFLUENZA VACCINE  10/31/2020   COVID-19 Vaccine (5 - Booster for [place] series) 11/21/2020    Colorectal cancer screening: No longer required.   Mammogram status: Completed 12/02/2020. Repeat every year  Bone Density status: Completed 12/15/2020.   Lung Cancer Screening: (Low Dose CT Chest recommended if Age 78-80 years, 30 pack-year currently smoking OR have quit w/in 15years.) does not qualify.   Lung Cancer Screening Referral:  no  Additional Screening:  Hepatitis C Screening: does qualify; Completed  08/03/2012  Vision Screening: Recommended annual ophthalmology exams for early detection of glaucoma and other disorders of the eye. Is the patient up to date with their annual eye exam?  Yes  Who is the provider or what is the name of the office in which the patient attends annual eye exams? [facility] If pt is not established with a provider, would they like to be referred to a provider to establish care? No .   Dental Screening: Recommended annual dental exams for proper oral hygiene  Community Resource Referral / Chronic Care Management: CRR required this visit?  No   CCM required this visit?  No      Plan:     I have personally reviewed and noted the following in the patient's chart:   Medical and social history Use of alcohol, tobacco or illicit drugs  Current medications and supplements including opioid prescriptions.  Functional ability and status Nutritional status Physical activity Advanced directives List of other physicians Hospitalizations, surgeries, and ER visits in previous 12 months Vitals Screenings to include cognitive, depression, and falls Referrals and appointments  In addition, I have reviewed and discussed with patient certain preventive protocols, quality metrics, and best practice recommendations. A written personalized care plan for preventive services as well as general preventive health recommendations were provided to patient.     Kellie Simmering, LPN   [id number]   Nurse Notes:

## 2020-12-31 ENCOUNTER — Other Ambulatory Visit: Payer: Self-pay | Admitting: Internal Medicine

## 2020-12-31 DIAGNOSIS — Z79899 Other long term (current) drug therapy: Secondary | ICD-10-CM

## 2021-01-05 ENCOUNTER — Encounter: Payer: Self-pay | Admitting: Internal Medicine

## 2021-01-05 ENCOUNTER — Ambulatory Visit (INDEPENDENT_AMBULATORY_CARE_PROVIDER_SITE_OTHER): Payer: Medicare HMO | Admitting: Internal Medicine

## 2021-01-05 ENCOUNTER — Ambulatory Visit: Payer: Medicare HMO

## 2021-01-05 ENCOUNTER — Other Ambulatory Visit: Payer: Self-pay

## 2021-01-05 VITALS — BP 124/80 | HR 65 | Temp 98.1°F | Ht 60.4 in | Wt 152.6 lb

## 2021-01-05 DIAGNOSIS — M791 Myalgia, unspecified site: Secondary | ICD-10-CM

## 2021-01-05 DIAGNOSIS — R7309 Other abnormal glucose: Secondary | ICD-10-CM

## 2021-01-05 DIAGNOSIS — E7800 Pure hypercholesterolemia, unspecified: Secondary | ICD-10-CM | POA: Diagnosis not present

## 2021-01-05 DIAGNOSIS — R3129 Other microscopic hematuria: Secondary | ICD-10-CM | POA: Diagnosis not present

## 2021-01-05 DIAGNOSIS — Z0000 Encounter for general adult medical examination without abnormal findings: Secondary | ICD-10-CM | POA: Diagnosis not present

## 2021-01-05 DIAGNOSIS — I10 Essential (primary) hypertension: Secondary | ICD-10-CM | POA: Diagnosis not present

## 2021-01-05 DIAGNOSIS — E559 Vitamin D deficiency, unspecified: Secondary | ICD-10-CM

## 2021-01-05 MED ORDER — ESCITALOPRAM OXALATE 20 MG PO TABS: 20.0000 mg | ORAL_TABLET | Freq: Every day | ORAL | 2 refills | Status: DC

## 2021-01-05 MED ORDER — OLMESARTAN MEDOXOMIL-HCTZ 40-12.5 MG PO TABS: 1.0000 | ORAL_TABLET | Freq: Every day | ORAL | 2 refills | Status: DC

## 2021-01-05 NOTE — Progress Notes (Signed)
[name],acting as a Education administrator for [name], MD.,have documented all relevant documentation on the behalf of [name], MD,as directed by  [name], MD while in the presence of [name], MD.  This visit occurred during the SARS-CoV-2 public health emergency.  Safety protocols were in place, including screening questions prior to the visit, additional usage of staff PPE, and extensive cleaning of exam room while observing appropriate contact time as indicated for disinfecting solutions.  Subjective:     Patient ID: Sara Obrien , female    DOB: 07/31/1945 , 75 y.o.   MRN: [id number]   Chief Complaint  Patient presents with   Annual Exam   Hypertension    HPI  Patient here for HM.  She no longer sees a GYN provider. She reports compliance with meds. She denies headaches, chest pain and shortness of breath. She has no new concerns at this time.   Hypertension This is a chronic problem. The current episode started more than 1 year ago. The problem has been gradually improving since onset. The problem is controlled. Pertinent negatives include no blurred vision, chest pain, palpitations or shortness of breath. Past treatments include angiotensin blockers and diuretics. The current treatment provides moderate improvement. There are no compliance problems.  Hypertensive end-organ damage includes kidney disease.    Past Medical History:  Diagnosis Date   Anxiety    Atypical chest pain 07/01/2017   Essential hypertension 07/01/2017   Gum disease    dentures worn   Hyperlipidemia    Hypertension    Migraines      Family History  Problem Relation Age of Onset   Brain cancer Mother    Hypertension Mother    Stroke Father    Hypertension Father    Hypertension Sister    Cancer Brother    Cancer Brother    Cancer Brother    Breast cancer Sister      Current Outpatient Medications:    atorvastatin (LIPITOR) 20 MG tablet, Take one tablet by mouth  daily., Disp: 90 tablet, Rfl: 2   diclofenac (VOLTAREN) 75 MG EC tablet, Take 75 mg by mouth 2 (two) times daily as needed., Disp: , Rfl:    Omega-3 Fatty Acids (OMEGA 3 500 PO), Take by mouth., Disp: , Rfl:    TURMERIC PO, Take 1,300 mg by mouth daily., Disp: , Rfl:    escitalopram (LEXAPRO) 20 MG tablet, Take 1 tablet (20 mg total) by mouth daily., Disp: 90 tablet, Rfl: 2   olmesartan-hydrochlorothiazide (BENICAR HCT) 40-12.5 MG tablet, Take 1 tablet by mouth daily., Disp: 90 tablet, Rfl: 2   No Known Allergies    The patient states she uses post menopausal status for birth control. Last LMP was No LMP recorded. Patient is postmenopausal.. Negative for Dysmenorrhea. Negative for: breast discharge, breast lump(s), breast pain and breast self exam. Associated symptoms include abnormal vaginal bleeding. Pertinent negatives include abnormal bleeding (hematology), anxiety, decreased libido, depression, difficulty falling sleep, dyspareunia, history of infertility, nocturia, sexual dysfunction, sleep disturbances, urinary incontinence, urinary urgency, vaginal discharge and vaginal itching. Diet regular.The patient states her exercise level is  moderate.  . The patient's tobacco use is:  Social History   Tobacco Use  Smoking Status Never  Smokeless Tobacco Never  . She has been exposed to passive smoke. The patient's alcohol use is:  Social History   Substance and Sexual Activity  Alcohol Use Yes   Comment: glass of wine  Review of Systems  Constitutional: Negative.   HENT: Negative.    Eyes: Negative.  Negative for blurred vision.  Respiratory: Negative.  Negative for shortness of breath.   Cardiovascular: Negative.  Negative for chest pain and palpitations.  Gastrointestinal: Negative.   Endocrine: Negative.   Genitourinary: Negative.   Musculoskeletal:  Positive for myalgias.       She reports worsening left arm/left leg pain since beginning of August. She reports sx are  exacerbated when playing pickle ball.   Skin: Negative.   Allergic/Immunologic: Negative.   Neurological: Negative.   Hematological: Negative.   Psychiatric/Behavioral: Negative.      Today's Vitals   01/05/21 1053  BP: 124/80  Pulse: 65  Temp: 98.1 F (36.7 C)  Weight: 152 lb 9.6 oz (69.2 kg)  Height: 5' 0.4" (1.534 m)  PainSc: 0-No pain   Body mass index is 29.41 kg/m.  Wt Readings from Last 3 Encounters:  01/05/21 152 lb 9.6 oz (69.2 kg)  12/29/20 154 lb 9.6 oz (70.1 kg)  07/18/20 167 lb 6.4 oz (75.9 kg)     Objective:  Physical Exam Vitals and nursing note reviewed.  Constitutional:      General: She is not in acute distress.    Appearance: Normal appearance. She is well-developed. She is obese.  HENT:     Head: Normocephalic and atraumatic.     Right Ear: Hearing, tympanic membrane, ear canal and external ear normal. There is no impacted cerumen.     Left Ear: Hearing, tympanic membrane, ear canal and external ear normal. There is no impacted cerumen.     Nose:     Comments: Deferred - masked    Mouth/Throat:     Comments: Deferred - masked Eyes:     General: Lids are normal.     Extraocular Movements: Extraocular movements intact.     Conjunctiva/sclera: Conjunctivae normal.     Pupils: Pupils are equal, round, and reactive to light.     Funduscopic exam:    Right eye: No papilledema.        Left eye: No papilledema.  Neck:     Thyroid: No thyroid mass.     Vascular: No carotid bruit.  Cardiovascular:     Rate and Rhythm: Normal rate and regular rhythm.     Pulses: Normal pulses.     Heart sounds: Normal heart sounds. No murmur heard. Pulmonary:     Effort: Pulmonary effort is normal.     Breath sounds: Normal breath sounds.  Chest:     Chest wall: No mass.  Breasts:    Tanner Score is 5.     Right: Normal. No mass or tenderness.     Left: Normal. No mass or tenderness.  Abdominal:     General: Bowel sounds are normal. There is no distension.      Palpations: Abdomen is soft.     Tenderness: There is no abdominal tenderness.  Genitourinary:    Comments: Deferred  Musculoskeletal:        General: No swelling. Normal range of motion.     Cervical back: Full passive range of motion without pain, normal range of motion and neck supple.     Right lower leg: No edema.     Left lower leg: No edema.  Lymphadenopathy:     Upper Body:     Right upper body: No supraclavicular, axillary or pectoral adenopathy.     Left upper body: No supraclavicular, axillary or pectoral adenopathy.  Skin:    General: Skin is warm and dry.     Capillary Refill: Capillary refill takes less than 2 seconds.  Neurological:     General: No focal deficit present.     Mental Status: She is alert and oriented to person, place, and time.     Cranial Nerves: No cranial nerve deficit.     Sensory: No sensory deficit.  Psychiatric:        Mood and Affect: Mood normal.        Behavior: Behavior normal.        Thought Content: Thought content normal.        Judgment: Judgment normal.        Assessment And Plan:     1. Encounter for general adult medical examination w/o abnormal findings Comments: A full exam was performed. Importance of monthly self breast exam was discussed with the patient. PATIENT IS ADVISED TO GET 30-45 MINUTES REGULAR EXERCISE NO LESS THAN FOUR TO FIVE DAYS PER WEEK - BOTH WEIGHTBEARING EXERCISES AND AEROBIC ARE RECOMMENDED.  PATIENT IS ADVISED TO FOLLOW A HEALTHY DIET WITH AT LEAST SIX FRUITS/VEGGIES PER DAY, DECREASE INTAKE OF RED MEAT, AND TO INCREASE FISH INTAKE TO TWO DAYS PER WEEK.  MEATS/FISH SHOULD NOT BE FRIED, BAKED OR BROILED IS PREFERABLE.  IT IS ALSO IMPORTANT TO CUT BACK ON YOUR SUGAR INTAKE. PLEASE AVOID ANYTHING WITH ADDED SUGAR, CORN SYRUP OR OTHER SWEETENERS. IF YOU MUST USE A SWEETENER, YOU CAN TRY STEVIA. IT IS ALSO IMPORTANT TO AVOID ARTIFICIALLY SWEETENERS AND DIET BEVERAGES. LASTLY, I SUGGEST WEARING SPF 50 SUNSCREEN ON  EXPOSED PARTS AND ESPECIALLY WHEN IN THE DIRECT SUNLIGHT FOR AN EXTENDED PERIOD OF TIME.  PLEASE AVOID FAST FOOD RESTAURANTS AND INCREASE YOUR WATER INTAKE.  2. Essential hypertension Comments: Chronic, well controlled. EKG performed, NSR w/ nonspecific abnormality. She is encouraged to follow low sodium diet. She will f/u in 6 months.  - EKG 12-Lead - CMP14+EGFR  3. Pure hypercholesterolemia Comments: Chronic, currently on atorvastatin. Advised to HOLD meds for now due to myalgias. Will make further recommendations once labs are back. - Lipid panel  4. Other abnormal glucose Comments: Her a1c has been elevated in the past. I will recheck an a1c today. Encouraged to cut back on her intake of sweetened beverages, including diet drinks.  - Hemoglobin A1c  5. Microscopic hematuria Comments: This has been seen on previous specimens. I will recheck u/a today.  - Urinalysis, Reflex Microscopic  6. Myalgia Comments: She is advised to HOLD atorvastatin for now. I will check labs as listed below. She may benefit from Mg supplementation. She is encouraged to let me know if her sx persist.  - CK, total - Sed Rate (ESR) - ANA, IFA (with reflex)  Patient was given opportunity to ask questions. Patient verbalized understanding of the plan and was able to repeat key elements of the plan. All questions were answered to their satisfaction.   I, [name], MD, have reviewed all documentation for this visit. The documentation on 01/10/21 for the exam, diagnosis, procedures, and orders are all accurate and complete.   THE PATIENT IS ENCOURAGED TO PRACTICE SOCIAL DISTANCING DUE TO THE COVID-19 PANDEMIC.

## 2021-01-05 NOTE — Patient Instructions (Signed)
Health Maintenance, Female Adopting a healthy lifestyle and getting preventive care are important in promoting health and wellness. Ask your health care provider about: The right schedule for you to have regular tests and exams. Things you can do on your own to prevent diseases and keep yourself healthy. What should I know about diet, weight, and exercise? Eat a healthy diet  Eat a diet that includes plenty of vegetables, fruits, low-fat dairy products, and lean protein. Do not eat a lot of foods that are high in solid fats, added sugars, or sodium. Maintain a healthy weight Body mass index (BMI) is used to identify weight problems. It estimates body fat based on height and weight. Your health care provider can help determine your BMI and help you achieve or maintain a healthy weight. Get regular exercise Get regular exercise. This is one of the most important things you can do for your health. Most adults should: Exercise for at least 150 minutes each week. The exercise should increase your heart rate and make you sweat (moderate-intensity exercise). Do strengthening exercises at least twice a week. This is in addition to the moderate-intensity exercise. Spend less time sitting. Even light physical activity can be beneficial. Watch cholesterol and blood lipids Have your blood tested for lipids and cholesterol at 75 years of age, then have this test every 5 years. Have your cholesterol levels checked more often if: Your lipid or cholesterol levels are high. You are older than 75 years of age. You are at high risk for heart disease. What should I know about cancer screening? Depending on your health history and family history, you may need to have cancer screening at various ages. This may include screening for: Breast cancer. Cervical cancer. Colorectal cancer. Skin cancer. Lung cancer. What should I know about heart disease, diabetes, and high blood pressure? Blood pressure and heart  disease High blood pressure causes heart disease and increases the risk of stroke. This is more likely to develop in people who have high blood pressure readings, are of African descent, or are overweight. Have your blood pressure checked: Every 3-5 years if you are 18-39 years of age. Every year if you are 40 years old or older. Diabetes Have regular diabetes screenings. This checks your fasting blood sugar level. Have the screening done: Once every three years after age 40 if you are at a normal weight and have a low risk for diabetes. More often and at a younger age if you are overweight or have a high risk for diabetes. What should I know about preventing infection? Hepatitis B If you have a higher risk for hepatitis B, you should be screened for this virus. Talk with your health care provider to find out if you are at risk for hepatitis B infection. Hepatitis C Testing is recommended for: Everyone born from 1945 through 1965. Anyone with known risk factors for hepatitis C. Sexually transmitted infections (STIs) Get screened for STIs, including gonorrhea and chlamydia, if: You are sexually active and are younger than 75 years of age. You are older than 75 years of age and your health care provider tells you that you are at risk for this type of infection. Your sexual activity has changed since you were last screened, and you are at increased risk for chlamydia or gonorrhea. Ask your health care provider if you are at risk. Ask your health care provider about whether you are at high risk for HIV. Your health care provider may recommend a prescription medicine   to help prevent HIV infection. If you choose to take medicine to prevent HIV, you should first get tested for HIV. You should then be tested every 3 months for as long as you are taking the medicine. Pregnancy If you are about to stop having your period (premenopausal) and you may become pregnant, seek counseling before you get  pregnant. Take 400 to 800 micrograms (mcg) of folic acid every day if you become pregnant. Ask for birth control (contraception) if you want to prevent pregnancy. Osteoporosis and menopause Osteoporosis is a disease in which the bones lose minerals and strength with aging. This can result in bone fractures. If you are 65 years old or older, or if you are at risk for osteoporosis and fractures, ask your health care provider if you should: Be screened for bone loss. Take a calcium or vitamin D supplement to lower your risk of fractures. Be given hormone replacement therapy (HRT) to treat symptoms of menopause. Follow these instructions at home: Lifestyle Do not use any products that contain nicotine or tobacco, such as cigarettes, e-cigarettes, and chewing tobacco. If you need help quitting, ask your health care provider. Do not use street drugs. Do not share needles. Ask your health care provider for help if you need support or information about quitting drugs. Alcohol use Do not drink alcohol if: Your health care provider tells you not to drink. You are pregnant, may be pregnant, or are planning to become pregnant. If you drink alcohol: Limit how much you use to 0-1 drink a day. Limit intake if you are breastfeeding. Be aware of how much alcohol is in your drink. In the [place] one drink equals one 12 oz bottle of beer (355 mL), one 5 oz glass of wine (148 mL), or one 1 oz glass of hard liquor (44 mL). General instructions Schedule regular health, dental, and eye exams. Stay current with your vaccines. Tell your health care provider if: You often feel depressed. You have ever been abused or do not feel safe at home. Summary Adopting a healthy lifestyle and getting preventive care are important in promoting health and wellness. Follow your health care provider's instructions about healthy diet, exercising, and getting tested or screened for diseases. Follow your health care provider's  instructions on monitoring your cholesterol and blood pressure. This information is not intended to replace advice given to you by your health care provider. Make sure you discuss any questions you have with your health care provider. Document Revised: 05/27/2020 Document Reviewed: 03/12/2018 Elsevier Patient Education  2022 Elsevier Inc.  

## 2021-01-09 LAB — MICROSCOPIC EXAMINATION
Bacteria, UA: NONE SEEN
Casts: NONE SEEN /lpf

## 2021-01-09 LAB — CMP14+EGFR
ALT: 12 IU/L (ref 0–32)
AST: 16 IU/L (ref 0–40)
Albumin/Globulin Ratio: 1.3 (ref 1.2–2.2)
Albumin: 4.7 g/dL (ref 3.7–4.7)
Alkaline Phosphatase: 72 IU/L (ref 44–121)
BUN/Creatinine Ratio: 11 — ABNORMAL LOW (ref 12–28)
BUN: 10 mg/dL (ref 8–27)
Bilirubin Total: 0.4 mg/dL (ref 0.0–1.2)
CO2: 28 mmol/L (ref 20–29)
Calcium: 10.4 mg/dL — ABNORMAL HIGH (ref 8.7–10.3)
Chloride: 97 mmol/L (ref 96–106)
Creatinine, Ser: 0.9 mg/dL (ref 0.57–1.00)
Globulin, Total: 3.5 g/dL (ref 1.5–4.5)
Glucose: 91 mg/dL (ref 70–99)
Potassium: 4.2 mmol/L (ref 3.5–5.2)
Sodium: 142 mmol/L (ref 134–144)
Total Protein: 8.2 g/dL (ref 6.0–8.5)
eGFR: 67 mL/min/{1.73_m2} (ref >59)

## 2021-01-09 LAB — ANTINUCLEAR ANTIBODIES, IFA: ANA Titer 1: NEGATIVE

## 2021-01-09 LAB — LIPID PANEL
Chol/HDL Ratio: 3.9 ratio (ref 0.0–4.4)
Cholesterol, Total: 193 mg/dL (ref 100–199)
HDL: 50 mg/dL (ref >39)
LDL Chol Calc (NIH): 125 mg/dL — ABNORMAL HIGH (ref 0–99)
Triglycerides: 100 mg/dL (ref 0–149)
VLDL Cholesterol Cal: 18 mg/dL (ref 5–40)

## 2021-01-09 LAB — SEDIMENTATION RATE: Sed Rate: 11 mm/hr (ref 0–40)

## 2021-01-09 LAB — URINALYSIS, ROUTINE W REFLEX MICROSCOPIC
Bilirubin, UA: NEGATIVE
Glucose, UA: NEGATIVE
Ketones, UA: NEGATIVE
Nitrite, UA: NEGATIVE
RBC, UA: NEGATIVE
Specific Gravity, UA: 1.021 (ref 1.005–1.030)
Urobilinogen, Ur: 0.2 mg/dL (ref 0.2–1.0)
pH, UA: 5.5 (ref 5.0–7.5)

## 2021-01-09 LAB — HEMOGLOBIN A1C
Est. average glucose Bld gHb Est-mCnc: 120 mg/dL
Hgb A1c MFr Bld: 5.8 % — ABNORMAL HIGH (ref 4.8–5.6)

## 2021-01-09 LAB — CK: Total CK: 93 U/L (ref 32–182)

## 2021-01-10 DIAGNOSIS — R3129 Other microscopic hematuria: Secondary | ICD-10-CM | POA: Insufficient documentation

## 2021-01-10 DIAGNOSIS — R7309 Other abnormal glucose: Secondary | ICD-10-CM | POA: Insufficient documentation

## 2021-01-10 DIAGNOSIS — M791 Myalgia, unspecified site: Secondary | ICD-10-CM | POA: Insufficient documentation

## 2021-01-11 ENCOUNTER — Other Ambulatory Visit: Payer: Self-pay | Admitting: Internal Medicine

## 2021-01-12 ENCOUNTER — Ambulatory Visit: Payer: Medicare HMO

## 2021-01-16 ENCOUNTER — Encounter: Payer: Self-pay | Admitting: Internal Medicine

## 2021-01-29 ENCOUNTER — Encounter: Payer: Self-pay | Admitting: Internal Medicine

## 2021-03-20 ENCOUNTER — Other Ambulatory Visit: Payer: Self-pay

## 2021-03-20 ENCOUNTER — Ambulatory Visit: Payer: Medicare HMO | Attending: Internal Medicine

## 2021-03-20 ENCOUNTER — Other Ambulatory Visit (HOSPITAL_BASED_OUTPATIENT_CLINIC_OR_DEPARTMENT_OTHER): Payer: Self-pay

## 2021-03-20 DIAGNOSIS — Z23 Encounter for immunization: Secondary | ICD-10-CM

## 2021-03-20 MED ORDER — PFIZER COVID-19 VAC BIVALENT 30 MCG/0.3ML IM SUSP
INTRAMUSCULAR | 0 refills | Status: DC
  Filled 2021-03-20: qty 0.3, 1d supply, fill #0

## 2021-03-20 NOTE — Progress Notes (Signed)
° °  Covid-19 Vaccination Clinic  Name:  Sara Obrien    MRN: [id number] DOB: Jan 06, 1946  03/20/2021  Sara Obrien was observed post Covid-19 immunization for 15 minutes without incident. She was provided with Vaccine Information Sheet and instruction to access the V-Safe system.   Sara Obrien was instructed to call 911 with any severe reactions post vaccine: Difficulty breathing  Swelling of face and throat  A fast heartbeat  A bad rash all over body  Dizziness and weakness   Immunizations Administered     Name Date Dose VIS Date Route   Pfizer Covid-19 Vaccine Bivalent Booster 03/20/2021 10:43 AM 0.3 mL 11/30/2020 Intramuscular   Manufacturer: [place]   Lot: [id number]   [place]: [phone]

## 2021-04-03 ENCOUNTER — Encounter: Payer: Self-pay | Admitting: Internal Medicine

## 2021-04-13 DIAGNOSIS — M5451 Vertebrogenic low back pain: Secondary | ICD-10-CM | POA: Diagnosis not present

## 2021-06-15 DIAGNOSIS — M5386 Other specified dorsopathies, lumbar region: Secondary | ICD-10-CM | POA: Diagnosis not present

## 2021-06-15 DIAGNOSIS — M62830 Muscle spasm of back: Secondary | ICD-10-CM | POA: Diagnosis not present

## 2021-06-15 DIAGNOSIS — M9904 Segmental and somatic dysfunction of sacral region: Secondary | ICD-10-CM | POA: Diagnosis not present

## 2021-06-15 DIAGNOSIS — M9903 Segmental and somatic dysfunction of lumbar region: Secondary | ICD-10-CM | POA: Diagnosis not present

## 2021-06-19 DIAGNOSIS — M9904 Segmental and somatic dysfunction of sacral region: Secondary | ICD-10-CM | POA: Diagnosis not present

## 2021-06-19 DIAGNOSIS — M5386 Other specified dorsopathies, lumbar region: Secondary | ICD-10-CM | POA: Diagnosis not present

## 2021-06-19 DIAGNOSIS — M62830 Muscle spasm of back: Secondary | ICD-10-CM | POA: Diagnosis not present

## 2021-06-19 DIAGNOSIS — M9903 Segmental and somatic dysfunction of lumbar region: Secondary | ICD-10-CM | POA: Diagnosis not present

## 2021-06-21 DIAGNOSIS — M62830 Muscle spasm of back: Secondary | ICD-10-CM | POA: Diagnosis not present

## 2021-06-21 DIAGNOSIS — M9904 Segmental and somatic dysfunction of sacral region: Secondary | ICD-10-CM | POA: Diagnosis not present

## 2021-06-21 DIAGNOSIS — M9903 Segmental and somatic dysfunction of lumbar region: Secondary | ICD-10-CM | POA: Diagnosis not present

## 2021-06-21 DIAGNOSIS — M5386 Other specified dorsopathies, lumbar region: Secondary | ICD-10-CM | POA: Diagnosis not present

## 2021-06-22 DIAGNOSIS — M9903 Segmental and somatic dysfunction of lumbar region: Secondary | ICD-10-CM | POA: Diagnosis not present

## 2021-06-22 DIAGNOSIS — M5386 Other specified dorsopathies, lumbar region: Secondary | ICD-10-CM | POA: Diagnosis not present

## 2021-06-22 DIAGNOSIS — M62830 Muscle spasm of back: Secondary | ICD-10-CM | POA: Diagnosis not present

## 2021-06-22 DIAGNOSIS — M9904 Segmental and somatic dysfunction of sacral region: Secondary | ICD-10-CM | POA: Diagnosis not present

## 2021-06-26 DIAGNOSIS — M5386 Other specified dorsopathies, lumbar region: Secondary | ICD-10-CM | POA: Diagnosis not present

## 2021-06-26 DIAGNOSIS — M9903 Segmental and somatic dysfunction of lumbar region: Secondary | ICD-10-CM | POA: Diagnosis not present

## 2021-06-26 DIAGNOSIS — M62830 Muscle spasm of back: Secondary | ICD-10-CM | POA: Diagnosis not present

## 2021-06-26 DIAGNOSIS — M9904 Segmental and somatic dysfunction of sacral region: Secondary | ICD-10-CM | POA: Diagnosis not present

## 2021-06-28 DIAGNOSIS — M9904 Segmental and somatic dysfunction of sacral region: Secondary | ICD-10-CM | POA: Diagnosis not present

## 2021-06-28 DIAGNOSIS — M62830 Muscle spasm of back: Secondary | ICD-10-CM | POA: Diagnosis not present

## 2021-06-28 DIAGNOSIS — M9903 Segmental and somatic dysfunction of lumbar region: Secondary | ICD-10-CM | POA: Diagnosis not present

## 2021-06-28 DIAGNOSIS — M5386 Other specified dorsopathies, lumbar region: Secondary | ICD-10-CM | POA: Diagnosis not present

## 2021-06-29 DIAGNOSIS — M9903 Segmental and somatic dysfunction of lumbar region: Secondary | ICD-10-CM | POA: Diagnosis not present

## 2021-06-29 DIAGNOSIS — M5386 Other specified dorsopathies, lumbar region: Secondary | ICD-10-CM | POA: Diagnosis not present

## 2021-06-29 DIAGNOSIS — M62830 Muscle spasm of back: Secondary | ICD-10-CM | POA: Diagnosis not present

## 2021-06-29 DIAGNOSIS — M9904 Segmental and somatic dysfunction of sacral region: Secondary | ICD-10-CM | POA: Diagnosis not present

## 2021-07-03 ENCOUNTER — Other Ambulatory Visit: Payer: Self-pay

## 2021-07-03 DIAGNOSIS — Z0000 Encounter for general adult medical examination without abnormal findings: Secondary | ICD-10-CM

## 2021-07-03 MED ORDER — ESCITALOPRAM OXALATE 20 MG PO TABS: 20.0000 mg | ORAL_TABLET | Freq: Every day | ORAL | 2 refills | Status: DC

## 2021-07-04 DIAGNOSIS — M9904 Segmental and somatic dysfunction of sacral region: Secondary | ICD-10-CM | POA: Diagnosis not present

## 2021-07-04 DIAGNOSIS — M5386 Other specified dorsopathies, lumbar region: Secondary | ICD-10-CM | POA: Diagnosis not present

## 2021-07-04 DIAGNOSIS — M62830 Muscle spasm of back: Secondary | ICD-10-CM | POA: Diagnosis not present

## 2021-07-04 DIAGNOSIS — M9903 Segmental and somatic dysfunction of lumbar region: Secondary | ICD-10-CM | POA: Diagnosis not present

## 2021-07-06 ENCOUNTER — Ambulatory Visit (INDEPENDENT_AMBULATORY_CARE_PROVIDER_SITE_OTHER): Payer: Medicare HMO | Admitting: Internal Medicine

## 2021-07-06 ENCOUNTER — Encounter: Payer: Self-pay | Admitting: Internal Medicine

## 2021-07-06 VITALS — BP 118/80 | HR 83 | Temp 99.1°F | Ht 60.0 in | Wt 160.4 lb

## 2021-07-06 DIAGNOSIS — H6121 Impacted cerumen, right ear: Secondary | ICD-10-CM | POA: Diagnosis not present

## 2021-07-06 DIAGNOSIS — R7309 Other abnormal glucose: Secondary | ICD-10-CM | POA: Diagnosis not present

## 2021-07-06 DIAGNOSIS — N182 Chronic kidney disease, stage 2 (mild): Secondary | ICD-10-CM | POA: Diagnosis not present

## 2021-07-06 DIAGNOSIS — M9904 Segmental and somatic dysfunction of sacral region: Secondary | ICD-10-CM | POA: Diagnosis not present

## 2021-07-06 DIAGNOSIS — M5386 Other specified dorsopathies, lumbar region: Secondary | ICD-10-CM | POA: Diagnosis not present

## 2021-07-06 DIAGNOSIS — I129 Hypertensive chronic kidney disease with stage 1 through stage 4 chronic kidney disease, or unspecified chronic kidney disease: Secondary | ICD-10-CM

## 2021-07-06 DIAGNOSIS — J0100 Acute maxillary sinusitis, unspecified: Secondary | ICD-10-CM | POA: Diagnosis not present

## 2021-07-06 DIAGNOSIS — M62830 Muscle spasm of back: Secondary | ICD-10-CM | POA: Diagnosis not present

## 2021-07-06 DIAGNOSIS — R509 Fever, unspecified: Secondary | ICD-10-CM | POA: Diagnosis not present

## 2021-07-06 DIAGNOSIS — R0982 Postnasal drip: Secondary | ICD-10-CM | POA: Diagnosis not present

## 2021-07-06 DIAGNOSIS — M9903 Segmental and somatic dysfunction of lumbar region: Secondary | ICD-10-CM | POA: Diagnosis not present

## 2021-07-06 LAB — POC COVID19 BINAXNOW: SARS Coronavirus 2 Ag: NEGATIVE

## 2021-07-06 MED ORDER — AMOXICILLIN-POT CLAVULANATE 875-125 MG PO TABS: 1.0000 | ORAL_TABLET | Freq: Two times a day (BID) | ORAL | 0 refills | Status: AC

## 2021-07-06 NOTE — Progress Notes (Signed)
?[name],acting as a Education administrator for [name], MD.,have documented all relevant documentation on the behalf of [name], MD,as directed by  [name], MD while in the presence of [name], MD.  ?This visit occurred during the SARS-CoV-2 public health emergency.  Safety protocols were in place, including screening questions prior to the visit, additional usage of staff PPE, and extensive cleaning of exam room while observing appropriate contact time as indicated for disinfecting solutions. ? ?Subjective:  ?  ? Patient ID: Sara Obrien , female    DOB: Feb 02, 1946 , 76 y.o.   MRN: [id number] ? ? ?Chief Complaint  ?Patient presents with  ? Hypertension  ? ? ?HPI ? ?She is here today for a blood pressure f/u.  She reports compliance with meds. She denies headaches, chest pain and shortness of breath. However, she does report running a high temp, possibly a fever, along with night sweats. She is not sure what may have contributed to her sx. Denies ill contacts. Also has a cough, no headache, body aches.  ? ? ?Hypertension ?This is a chronic problem. The current episode started more than 1 year ago. The problem has been gradually improving since onset. The problem is controlled. Pertinent negatives include no blurred vision, chest pain, palpitations or shortness of breath. Risk factors for coronary artery disease include dyslipidemia, obesity, post-menopausal state and sedentary lifestyle. The current treatment provides moderate improvement. Hypertensive end-organ damage includes kidney disease. Identifiable causes of hypertension include chronic renal disease.  ?Hyperlipidemia ?This is a chronic problem. The current episode started more than 1 year ago. The problem is controlled. Recent lipid tests were reviewed and are high. Exacerbating diseases include chronic renal disease and obesity. Pertinent negatives include no chest pain or shortness of breath.   ? ?Past Medical History:   ?Diagnosis Date  ? Anxiety   ? Atypical chest pain 07/01/2017  ? Essential hypertension 07/01/2017  ? Gum disease   ? dentures worn  ? Hyperlipidemia   ? Hypertension   ? Migraines   ?  ? ?Family History  ?Problem Relation Age of Onset  ? Brain cancer Mother   ? Hypertension Mother   ? Stroke Father   ? Hypertension Father   ? Hypertension Sister   ? Cancer Brother   ? Cancer Brother   ? Cancer Brother   ? Breast cancer Sister   ? ? ? ?Current Outpatient Medications:  ?  atorvastatin (LIPITOR) 20 MG tablet, TAKE 1 TABLET EVERY DAY, Disp: 90 tablet, Rfl: 2 ?  COVID-19 mRNA bivalent vaccine, Pfizer, (PFIZER COVID-19 VAC BIVALENT) injection, Inject into the muscle., Disp: 0.3 mL, Rfl: 0 ?  diclofenac (VOLTAREN) 75 MG EC tablet, Take 75 mg by mouth 2 (two) times daily as needed., Disp: , Rfl:  ?  escitalopram (LEXAPRO) 20 MG tablet, Take 1 tablet (20 mg total) by mouth daily., Disp: 90 tablet, Rfl: 2 ?  olmesartan-hydrochlorothiazide (BENICAR HCT) 40-12.5 MG tablet, Take 1 tablet by mouth daily., Disp: 90 tablet, Rfl: 2 ?  TURMERIC PO, Take 1,300 mg by mouth daily., Disp: , Rfl:  ?  Omega-3 Fatty Acids (OMEGA 3 500 PO), Take by mouth. (Patient not taking: Reported on 07/06/2021), Disp: , Rfl:   ? ?No Known Allergies  ? ?Review of Systems  ?Constitutional:  Positive for fatigue and fever.  ?HENT:    ?     States she had temp yesterday of 101.0. She states she started to feel bad  on Tuesday, she felt fatigued and lethargic. She has had sore throat, postnasal drip and headaches.  She didn't think anything of it because she thought it was her allergies bothering her. She feels tired despite drinking lots of fluids. Denies ill contacts.   ?Eyes:  Negative for blurred vision.  ?Respiratory: Negative.  Negative for shortness of breath.   ?Cardiovascular: Negative.  Negative for chest pain and palpitations.  ?Neurological: Negative.   ?Psychiatric/Behavioral: Negative.     ? ?Today's Vitals  ? 07/06/21 1047  ?BP: 118/80  ?Pulse: 83   ?Temp: 99.1 ?F (37.3 ?C)  ?Weight: 160 lb 6.4 oz (72.8 kg)  ?Height: 5' (1.524 m)  ? ?Body mass index is 31.33 kg/m?.  ?Wt Readings from Last 3 Encounters:  ?07/06/21 160 lb 6.4 oz (72.8 kg)  ?01/05/21 152 lb 9.6 oz (69.2 kg)  ?12/29/20 154 lb 9.6 oz (70.1 kg)  ?  ?Objective:  ?Physical Exam ?Vitals and nursing note reviewed.  ?Constitutional:   ?   Appearance: Normal appearance. She is ill-appearing.  ?HENT:  ?   Head: Normocephalic and atraumatic.  ?   Comments: B/l maxillary tenderness to percussion ?   Right Ear: Ear canal and external ear normal. There is impacted cerumen.  ?   Left Ear: Tympanic membrane, ear canal and external ear normal. There is no impacted cerumen.  ?   Nose:  ?   Comments: Masked  ?   Mouth/Throat:  ?   Comments: Masked  ?Eyes:  ?   Extraocular Movements: Extraocular movements intact.  ?Cardiovascular:  ?   Rate and Rhythm: Normal rate and regular rhythm.  ?   Heart sounds: Normal heart sounds.  ?Pulmonary:  ?   Effort: Pulmonary effort is normal.  ?   Breath sounds: Normal breath sounds.  ?Musculoskeletal:  ?   Cervical back: Normal range of motion.  ?Skin: ?   General: Skin is warm.  ?Neurological:  ?   General: No focal deficit present.  ?   Mental Status: She is alert.  ?Psychiatric:     ?   Mood and Affect: Mood normal.     ?   Behavior: Behavior normal.  ?    ?Assessment And Plan:  ?   ?1. Hypertensive nephropathy ?Comments: Chronic, well controlled. No med changes, encouraged to follow low sodium diet.  She will rto in October 2023 for her next physical examination.  ?- CMP14+EGFR ?- CBC no Diff ? ?2. Chronic renal disease, stage II ?Comments: Chronic, I will check renal function today. She is encouraged to stay well hydrated.  ? ?3. Acute non-recurrent maxillary sinusitis ?Comments: I iwll send rx Augmentin, encouraged to take full abx course. Rapid COVID negative, will send off PCR.  ?- Novel Coronavirus, NAA ([facility]) ?- amoxicillin-clavulanate (AUGMENTIN) 875-125 MG tablet;  Take 1 tablet by mouth 2 (two) times daily for 10 days.  Dispense: 20 tablet; Refill: 0 ?- POC COVID-19 ? ?4. Impacted cerumen of right ear ?Comments: Will not perform irrigation today, she agrees to rto in one month.  ? ?5. Other abnormal glucose ?Comments: Her a1c has been elevated in the past. I will recheck an a1c today, she is encouraged to avoid sugary beverages, refined carbs and processed meats.  ?- Hemoglobin A1c ?  ?Patient was given opportunity to ask questions. Patient verbalized understanding of the plan and was able to repeat key elements of the plan. All questions were answered to their satisfaction.  ? ?I, [name], MD, have  reviewed all documentation for this visit. The documentation on 07/06/21 for the exam, diagnosis, procedures, and orders are all accurate and complete.  ? ?IF YOU HAVE BEEN REFERRED TO A SPECIALIST, IT MAY TAKE 1-2 WEEKS TO SCHEDULE/PROCESS THE REFERRAL. IF YOU HAVE NOT HEARD FROM US/SPECIALIST IN TWO WEEKS, PLEASE GIVE [place] A CALL AT [phone].  ? ?THE PATIENT IS ENCOURAGED TO PRACTICE SOCIAL DISTANCING DUE TO THE COVID-19 PANDEMIC.   ?

## 2021-07-06 NOTE — Patient Instructions (Signed)

## 2021-07-07 ENCOUNTER — Encounter: Payer: Self-pay | Admitting: Internal Medicine

## 2021-07-07 LAB — CMP14+EGFR
ALT: 12 IU/L (ref 0–32)
AST: 21 IU/L (ref 0–40)
Albumin/Globulin Ratio: 1.3 (ref 1.2–2.2)
Albumin: 4.4 g/dL (ref 3.7–4.7)
Alkaline Phosphatase: 68 IU/L (ref 44–121)
BUN/Creatinine Ratio: 12 (ref 12–28)
BUN: 13 mg/dL (ref 8–27)
Bilirubin Total: 0.4 mg/dL (ref 0.0–1.2)
CO2: 25 mmol/L (ref 20–29)
Calcium: 10.2 mg/dL (ref 8.7–10.3)
Chloride: 103 mmol/L (ref 96–106)
Creatinine, Ser: 1.05 mg/dL — ABNORMAL HIGH (ref 0.57–1.00)
Globulin, Total: 3.4 g/dL (ref 1.5–4.5)
Glucose: 99 mg/dL (ref 70–99)
Potassium: 4.8 mmol/L (ref 3.5–5.2)
Sodium: 142 mmol/L (ref 134–144)
Total Protein: 7.8 g/dL (ref 6.0–8.5)
eGFR: 55 mL/min/{1.73_m2} — ABNORMAL LOW (ref >59)

## 2021-07-07 LAB — HEMOGLOBIN A1C
Est. average glucose Bld gHb Est-mCnc: 120 mg/dL
Hgb A1c MFr Bld: 5.8 % — ABNORMAL HIGH (ref 4.8–5.6)

## 2021-07-07 LAB — CBC
Hematocrit: 39.9 % (ref 34.0–46.6)
Hemoglobin: 13 g/dL (ref 11.1–15.9)
MCH: 28.6 pg (ref 26.6–33.0)
MCHC: 32.6 g/dL (ref 31.5–35.7)
MCV: 88 fL (ref 79–97)
Platelets: 457 10*3/uL — ABNORMAL HIGH (ref 150–450)
RBC: 4.54 x10E6/uL (ref 3.77–5.28)
RDW: 13.4 % (ref 11.7–15.4)
WBC: 5.2 10*3/uL (ref 3.4–10.8)

## 2021-07-07 LAB — NOVEL CORONAVIRUS, NAA: SARS-CoV-2, NAA: NOT DETECTED

## 2021-07-11 DIAGNOSIS — M5386 Other specified dorsopathies, lumbar region: Secondary | ICD-10-CM | POA: Diagnosis not present

## 2021-07-11 DIAGNOSIS — M62830 Muscle spasm of back: Secondary | ICD-10-CM | POA: Diagnosis not present

## 2021-07-11 DIAGNOSIS — M9904 Segmental and somatic dysfunction of sacral region: Secondary | ICD-10-CM | POA: Diagnosis not present

## 2021-07-11 DIAGNOSIS — M9903 Segmental and somatic dysfunction of lumbar region: Secondary | ICD-10-CM | POA: Diagnosis not present

## 2021-07-13 DIAGNOSIS — M62830 Muscle spasm of back: Secondary | ICD-10-CM | POA: Diagnosis not present

## 2021-07-13 DIAGNOSIS — M9904 Segmental and somatic dysfunction of sacral region: Secondary | ICD-10-CM | POA: Diagnosis not present

## 2021-07-13 DIAGNOSIS — M9903 Segmental and somatic dysfunction of lumbar region: Secondary | ICD-10-CM | POA: Diagnosis not present

## 2021-07-13 DIAGNOSIS — M5386 Other specified dorsopathies, lumbar region: Secondary | ICD-10-CM | POA: Diagnosis not present

## 2021-07-18 DIAGNOSIS — M9903 Segmental and somatic dysfunction of lumbar region: Secondary | ICD-10-CM | POA: Diagnosis not present

## 2021-07-18 DIAGNOSIS — M62830 Muscle spasm of back: Secondary | ICD-10-CM | POA: Diagnosis not present

## 2021-07-18 DIAGNOSIS — M9904 Segmental and somatic dysfunction of sacral region: Secondary | ICD-10-CM | POA: Diagnosis not present

## 2021-07-18 DIAGNOSIS — M5386 Other specified dorsopathies, lumbar region: Secondary | ICD-10-CM | POA: Diagnosis not present

## 2021-07-18 DIAGNOSIS — J0100 Acute maxillary sinusitis, unspecified: Secondary | ICD-10-CM | POA: Insufficient documentation

## 2021-08-01 DIAGNOSIS — M9906 Segmental and somatic dysfunction of lower extremity: Secondary | ICD-10-CM | POA: Diagnosis not present

## 2021-08-01 DIAGNOSIS — M5441 Lumbago with sciatica, right side: Secondary | ICD-10-CM | POA: Diagnosis not present

## 2021-08-01 DIAGNOSIS — M9901 Segmental and somatic dysfunction of cervical region: Secondary | ICD-10-CM | POA: Diagnosis not present

## 2021-08-01 DIAGNOSIS — M9903 Segmental and somatic dysfunction of lumbar region: Secondary | ICD-10-CM | POA: Diagnosis not present

## 2021-08-03 DIAGNOSIS — M9906 Segmental and somatic dysfunction of lower extremity: Secondary | ICD-10-CM | POA: Diagnosis not present

## 2021-08-03 DIAGNOSIS — M5441 Lumbago with sciatica, right side: Secondary | ICD-10-CM | POA: Diagnosis not present

## 2021-08-03 DIAGNOSIS — M9903 Segmental and somatic dysfunction of lumbar region: Secondary | ICD-10-CM | POA: Diagnosis not present

## 2021-08-03 DIAGNOSIS — M9901 Segmental and somatic dysfunction of cervical region: Secondary | ICD-10-CM | POA: Diagnosis not present

## 2021-08-04 DIAGNOSIS — M9903 Segmental and somatic dysfunction of lumbar region: Secondary | ICD-10-CM | POA: Diagnosis not present

## 2021-08-04 DIAGNOSIS — M5441 Lumbago with sciatica, right side: Secondary | ICD-10-CM | POA: Diagnosis not present

## 2021-08-04 DIAGNOSIS — M9901 Segmental and somatic dysfunction of cervical region: Secondary | ICD-10-CM | POA: Diagnosis not present

## 2021-08-04 DIAGNOSIS — M9906 Segmental and somatic dysfunction of lower extremity: Secondary | ICD-10-CM | POA: Diagnosis not present

## 2021-08-07 DIAGNOSIS — M9903 Segmental and somatic dysfunction of lumbar region: Secondary | ICD-10-CM | POA: Diagnosis not present

## 2021-08-07 DIAGNOSIS — M9906 Segmental and somatic dysfunction of lower extremity: Secondary | ICD-10-CM | POA: Diagnosis not present

## 2021-08-07 DIAGNOSIS — M9901 Segmental and somatic dysfunction of cervical region: Secondary | ICD-10-CM | POA: Diagnosis not present

## 2021-08-07 DIAGNOSIS — M5441 Lumbago with sciatica, right side: Secondary | ICD-10-CM | POA: Diagnosis not present

## 2021-08-08 ENCOUNTER — Encounter: Payer: Self-pay | Admitting: Internal Medicine

## 2021-08-08 ENCOUNTER — Ambulatory Visit (INDEPENDENT_AMBULATORY_CARE_PROVIDER_SITE_OTHER): Payer: Medicare HMO | Admitting: Internal Medicine

## 2021-08-08 VITALS — BP 122/80 | HR 72 | Temp 98.2°F | Ht 60.8 in | Wt 160.2 lb

## 2021-08-08 DIAGNOSIS — F331 Major depressive disorder, recurrent, moderate: Secondary | ICD-10-CM | POA: Diagnosis not present

## 2021-08-08 DIAGNOSIS — Z23 Encounter for immunization: Secondary | ICD-10-CM | POA: Diagnosis not present

## 2021-08-08 DIAGNOSIS — Z6830 Body mass index (BMI) 30.0-30.9, adult: Secondary | ICD-10-CM

## 2021-08-08 DIAGNOSIS — H6121 Impacted cerumen, right ear: Secondary | ICD-10-CM

## 2021-08-08 DIAGNOSIS — E6609 Other obesity due to excess calories: Secondary | ICD-10-CM

## 2021-08-08 NOTE — Progress Notes (Signed)
?[name],acting as a Education administrator for [name], MD.,have documented all relevant documentation on the behalf of [name], MD,as directed by  [name], MD while in the presence of [name], MD.  ?This visit occurred during the SARS-CoV-2 public health emergency.  Safety protocols were in place, including screening questions prior to the visit, additional usage of staff PPE, and extensive cleaning of exam room while observing appropriate contact time as indicated for disinfecting solutions. ? ?Subjective:  ?  ? Patient ID: Sara Obrien , female    DOB: 04/05/1945 , 76 y.o.   MRN: [id number] ? ? ?Chief Complaint  ?Patient presents with  ? Ear Fullness  ? Hypertension  ? ? ?HPI ? ?Patient presents today for f/u cerumen impaction. She did not have ears flushed at last visit due to acute infection. She is now feeling much better.  ?  ? ?Past Medical History:  ?Diagnosis Date  ? Anxiety   ? Atypical chest pain 07/01/2017  ? Essential hypertension 07/01/2017  ? Gum disease   ? dentures worn  ? Hyperlipidemia   ? Hypertension   ? Migraines   ?  ? ?Family History  ?Problem Relation Age of Onset  ? Brain cancer Mother   ? Hypertension Mother   ? Stroke Father   ? Hypertension Father   ? Hypertension Sister   ? Cancer Brother   ? Cancer Brother   ? Cancer Brother   ? Breast cancer Sister   ? ? ? ?Current Outpatient Medications:  ?  atorvastatin (LIPITOR) 20 MG tablet, TAKE 1 TABLET EVERY DAY, Disp: 90 tablet, Rfl: 2 ?  COVID-19 mRNA bivalent vaccine, Pfizer, (PFIZER COVID-19 VAC BIVALENT) injection, Inject into the muscle., Disp: 0.3 mL, Rfl: 0 ?  diclofenac (VOLTAREN) 75 MG EC tablet, Take 75 mg by mouth 2 (two) times daily as needed., Disp: , Rfl:  ?  escitalopram (LEXAPRO) 20 MG tablet, Take 1 tablet (20 mg total) by mouth daily., Disp: 90 tablet, Rfl: 2 ?  olmesartan-hydrochlorothiazide (BENICAR HCT) 40-12.5 MG tablet, Take 1 tablet by mouth daily., Disp: 90 tablet, Rfl: 2 ?  TURMERIC PO,  Take 1,300 mg by mouth daily., Disp: , Rfl:  ?  Omega-3 Fatty Acids (OMEGA 3 500 PO), Take by mouth. (Patient not taking: Reported on 07/06/2021), Disp: , Rfl:   ? ?No Known Allergies  ? ?Review of Systems  ?Constitutional: Negative.   ?Respiratory: Negative.    ?Cardiovascular: Negative.   ?Gastrointestinal: Negative.   ?Neurological: Negative.   ?Psychiatric/Behavioral:  Positive for decreased concentration, dysphoric mood and sleep disturbance.    ? ?Today's Vitals  ? 08/08/21 0930  ?BP: 122/80  ?Pulse: 72  ?Temp: 98.2 ?F (36.8 ?C)  ?Weight: 160 lb 3.2 oz (72.7 kg)  ?Height: 5' 0.8" (1.544 m)  ?PainSc: 4   ?PainLoc: Back  ? ?Body mass index is 30.47 kg/m?.  ?Wt Readings from Last 3 Encounters:  ?08/08/21 160 lb 3.2 oz (72.7 kg)  ?07/06/21 160 lb 6.4 oz (72.8 kg)  ?01/05/21 152 lb 9.6 oz (69.2 kg)  ?  ? ?Objective:  ?Physical Exam ?Vitals and nursing note reviewed.  ?Constitutional:   ?   Appearance: Normal appearance.  ?HENT:  ?   Head: Normocephalic and atraumatic.  ?   Right Ear: Ear canal and external ear normal. There is impacted cerumen.  ?   Left Ear: Tympanic membrane, ear canal and external ear normal. There is no impacted cerumen.  ?Cardiovascular:  ?  Rate and Rhythm: Normal rate and regular rhythm.  ?   Heart sounds: Normal heart sounds.  ?Pulmonary:  ?   Effort: Pulmonary effort is normal.  ?   Breath sounds: Normal breath sounds.  ?Musculoskeletal:  ?   Cervical back: Normal range of motion.  ?Skin: ?   General: Skin is warm.  ?Neurological:  ?   General: No focal deficit present.  ?   Mental Status: She is alert.  ?Psychiatric:     ?   Mood and Affect: Mood normal.     ?   Behavior: Behavior normal.  ?  ? ?   ?Assessment And Plan:  ?   ?1. Impacted cerumen of right ear ?Comments: AFTER OBTAINING VERBAL CONSENT, R EAR WAS FLUSHED BY IRRIGATION. SHE TOLERATED PROCEDURE WELL W/O ANY COMPLICATIONS. NO TM ABNORMALITIES NOTED. ? ?2. Moderate episode of recurrent major depressive disorder ([name]) ?Comments:  Chronic, on escitalopram. She agrees to referral for therapy.  Will consider Genesight testing, med change if sx do not improve. ?- Ambulatory referral to Psychology ? ?3. Class 1 obesity due to excess calories with serious comorbidity and body mass index (BMI) of 30.0 to 30.9 in adult ?Comments: She is encouraged to aim for at least 150 minutes of exercise per week.  ? ?4. Immunization due ?- Pneumococcal conjugate vaccine 20-valent (Prevnar-20) ?  ?Patient was given opportunity to ask questions. Patient verbalized understanding of the plan and was able to repeat key elements of the plan. All questions were answered to their satisfaction.  ? ? ?I, [name], MD, have reviewed all documentation for this visit. The documentation on 08/08/21 for the exam, diagnosis, procedures, and orders are all accurate and complete.  ? ?IF YOU HAVE BEEN REFERRED TO A SPECIALIST, IT MAY TAKE 1-2 WEEKS TO SCHEDULE/PROCESS THE REFERRAL. IF YOU HAVE NOT HEARD FROM US/SPECIALIST IN TWO WEEKS, PLEASE GIVE [place] A CALL AT [phone].  ? ?THE PATIENT IS ENCOURAGED TO PRACTICE SOCIAL DISTANCING DUE TO THE COVID-19 PANDEMIC.   ?

## 2021-08-09 DIAGNOSIS — M9906 Segmental and somatic dysfunction of lower extremity: Secondary | ICD-10-CM | POA: Diagnosis not present

## 2021-08-09 DIAGNOSIS — M9903 Segmental and somatic dysfunction of lumbar region: Secondary | ICD-10-CM | POA: Diagnosis not present

## 2021-08-09 DIAGNOSIS — M9901 Segmental and somatic dysfunction of cervical region: Secondary | ICD-10-CM | POA: Diagnosis not present

## 2021-08-09 DIAGNOSIS — M5441 Lumbago with sciatica, right side: Secondary | ICD-10-CM | POA: Diagnosis not present

## 2021-08-11 DIAGNOSIS — M9903 Segmental and somatic dysfunction of lumbar region: Secondary | ICD-10-CM | POA: Diagnosis not present

## 2021-08-11 DIAGNOSIS — M9901 Segmental and somatic dysfunction of cervical region: Secondary | ICD-10-CM | POA: Diagnosis not present

## 2021-08-11 DIAGNOSIS — M5441 Lumbago with sciatica, right side: Secondary | ICD-10-CM | POA: Diagnosis not present

## 2021-08-11 DIAGNOSIS — M9906 Segmental and somatic dysfunction of lower extremity: Secondary | ICD-10-CM | POA: Diagnosis not present

## 2021-08-14 DIAGNOSIS — M9906 Segmental and somatic dysfunction of lower extremity: Secondary | ICD-10-CM | POA: Diagnosis not present

## 2021-08-14 DIAGNOSIS — M9901 Segmental and somatic dysfunction of cervical region: Secondary | ICD-10-CM | POA: Diagnosis not present

## 2021-08-14 DIAGNOSIS — M5441 Lumbago with sciatica, right side: Secondary | ICD-10-CM | POA: Diagnosis not present

## 2021-08-14 DIAGNOSIS — M9903 Segmental and somatic dysfunction of lumbar region: Secondary | ICD-10-CM | POA: Diagnosis not present

## 2021-08-16 DIAGNOSIS — M9903 Segmental and somatic dysfunction of lumbar region: Secondary | ICD-10-CM | POA: Diagnosis not present

## 2021-08-16 DIAGNOSIS — M9906 Segmental and somatic dysfunction of lower extremity: Secondary | ICD-10-CM | POA: Diagnosis not present

## 2021-08-16 DIAGNOSIS — M9901 Segmental and somatic dysfunction of cervical region: Secondary | ICD-10-CM | POA: Diagnosis not present

## 2021-08-16 DIAGNOSIS — M5441 Lumbago with sciatica, right side: Secondary | ICD-10-CM | POA: Diagnosis not present

## 2021-08-18 DIAGNOSIS — M9906 Segmental and somatic dysfunction of lower extremity: Secondary | ICD-10-CM | POA: Diagnosis not present

## 2021-08-18 DIAGNOSIS — M9901 Segmental and somatic dysfunction of cervical region: Secondary | ICD-10-CM | POA: Diagnosis not present

## 2021-08-18 DIAGNOSIS — M5441 Lumbago with sciatica, right side: Secondary | ICD-10-CM | POA: Diagnosis not present

## 2021-08-18 DIAGNOSIS — M9903 Segmental and somatic dysfunction of lumbar region: Secondary | ICD-10-CM | POA: Diagnosis not present

## 2021-08-21 DIAGNOSIS — M9906 Segmental and somatic dysfunction of lower extremity: Secondary | ICD-10-CM | POA: Diagnosis not present

## 2021-08-21 DIAGNOSIS — M9903 Segmental and somatic dysfunction of lumbar region: Secondary | ICD-10-CM | POA: Diagnosis not present

## 2021-08-21 DIAGNOSIS — M9901 Segmental and somatic dysfunction of cervical region: Secondary | ICD-10-CM | POA: Diagnosis not present

## 2021-08-21 DIAGNOSIS — M5441 Lumbago with sciatica, right side: Secondary | ICD-10-CM | POA: Diagnosis not present

## 2021-08-23 DIAGNOSIS — M5441 Lumbago with sciatica, right side: Secondary | ICD-10-CM | POA: Diagnosis not present

## 2021-08-23 DIAGNOSIS — M9903 Segmental and somatic dysfunction of lumbar region: Secondary | ICD-10-CM | POA: Diagnosis not present

## 2021-08-23 DIAGNOSIS — M9906 Segmental and somatic dysfunction of lower extremity: Secondary | ICD-10-CM | POA: Diagnosis not present

## 2021-08-23 DIAGNOSIS — M9901 Segmental and somatic dysfunction of cervical region: Secondary | ICD-10-CM | POA: Diagnosis not present

## 2021-08-25 DIAGNOSIS — M9901 Segmental and somatic dysfunction of cervical region: Secondary | ICD-10-CM | POA: Diagnosis not present

## 2021-08-25 DIAGNOSIS — M5441 Lumbago with sciatica, right side: Secondary | ICD-10-CM | POA: Diagnosis not present

## 2021-08-25 DIAGNOSIS — M9903 Segmental and somatic dysfunction of lumbar region: Secondary | ICD-10-CM | POA: Diagnosis not present

## 2021-08-25 DIAGNOSIS — M9906 Segmental and somatic dysfunction of lower extremity: Secondary | ICD-10-CM | POA: Diagnosis not present

## 2021-08-29 ENCOUNTER — Encounter: Payer: Self-pay | Admitting: Internal Medicine

## 2021-08-30 DIAGNOSIS — M5441 Lumbago with sciatica, right side: Secondary | ICD-10-CM | POA: Diagnosis not present

## 2021-08-30 DIAGNOSIS — M9901 Segmental and somatic dysfunction of cervical region: Secondary | ICD-10-CM | POA: Diagnosis not present

## 2021-08-30 DIAGNOSIS — M9903 Segmental and somatic dysfunction of lumbar region: Secondary | ICD-10-CM | POA: Diagnosis not present

## 2021-08-30 DIAGNOSIS — M9906 Segmental and somatic dysfunction of lower extremity: Secondary | ICD-10-CM | POA: Diagnosis not present

## 2021-09-06 DIAGNOSIS — M9903 Segmental and somatic dysfunction of lumbar region: Secondary | ICD-10-CM | POA: Diagnosis not present

## 2021-09-06 DIAGNOSIS — M9901 Segmental and somatic dysfunction of cervical region: Secondary | ICD-10-CM | POA: Diagnosis not present

## 2021-09-06 DIAGNOSIS — M9906 Segmental and somatic dysfunction of lower extremity: Secondary | ICD-10-CM | POA: Diagnosis not present

## 2021-09-06 DIAGNOSIS — M5441 Lumbago with sciatica, right side: Secondary | ICD-10-CM | POA: Diagnosis not present

## 2021-09-13 DIAGNOSIS — M5441 Lumbago with sciatica, right side: Secondary | ICD-10-CM | POA: Diagnosis not present

## 2021-09-13 DIAGNOSIS — M9903 Segmental and somatic dysfunction of lumbar region: Secondary | ICD-10-CM | POA: Diagnosis not present

## 2021-09-13 DIAGNOSIS — M9906 Segmental and somatic dysfunction of lower extremity: Secondary | ICD-10-CM | POA: Diagnosis not present

## 2021-09-13 DIAGNOSIS — M9901 Segmental and somatic dysfunction of cervical region: Secondary | ICD-10-CM | POA: Diagnosis not present

## 2021-09-15 ENCOUNTER — Other Ambulatory Visit: Payer: Self-pay | Admitting: Internal Medicine

## 2021-09-15 DIAGNOSIS — I10 Essential (primary) hypertension: Secondary | ICD-10-CM

## 2021-09-20 DIAGNOSIS — M5441 Lumbago with sciatica, right side: Secondary | ICD-10-CM | POA: Diagnosis not present

## 2021-09-20 DIAGNOSIS — M9901 Segmental and somatic dysfunction of cervical region: Secondary | ICD-10-CM | POA: Diagnosis not present

## 2021-09-20 DIAGNOSIS — M9906 Segmental and somatic dysfunction of lower extremity: Secondary | ICD-10-CM | POA: Diagnosis not present

## 2021-09-20 DIAGNOSIS — M9903 Segmental and somatic dysfunction of lumbar region: Secondary | ICD-10-CM | POA: Diagnosis not present

## 2021-09-27 DIAGNOSIS — M9903 Segmental and somatic dysfunction of lumbar region: Secondary | ICD-10-CM | POA: Diagnosis not present

## 2021-09-27 DIAGNOSIS — M9901 Segmental and somatic dysfunction of cervical region: Secondary | ICD-10-CM | POA: Diagnosis not present

## 2021-09-27 DIAGNOSIS — M9906 Segmental and somatic dysfunction of lower extremity: Secondary | ICD-10-CM | POA: Diagnosis not present

## 2021-09-27 DIAGNOSIS — M5441 Lumbago with sciatica, right side: Secondary | ICD-10-CM | POA: Diagnosis not present

## 2021-10-04 DIAGNOSIS — M9906 Segmental and somatic dysfunction of lower extremity: Secondary | ICD-10-CM | POA: Diagnosis not present

## 2021-10-04 DIAGNOSIS — M9903 Segmental and somatic dysfunction of lumbar region: Secondary | ICD-10-CM | POA: Diagnosis not present

## 2021-10-04 DIAGNOSIS — M9901 Segmental and somatic dysfunction of cervical region: Secondary | ICD-10-CM | POA: Diagnosis not present

## 2021-10-04 DIAGNOSIS — M5441 Lumbago with sciatica, right side: Secondary | ICD-10-CM | POA: Diagnosis not present

## 2021-10-06 ENCOUNTER — Encounter: Payer: Self-pay | Admitting: Internal Medicine

## 2021-10-11 DIAGNOSIS — M9906 Segmental and somatic dysfunction of lower extremity: Secondary | ICD-10-CM | POA: Diagnosis not present

## 2021-10-11 DIAGNOSIS — M5441 Lumbago with sciatica, right side: Secondary | ICD-10-CM | POA: Diagnosis not present

## 2021-10-11 DIAGNOSIS — M9901 Segmental and somatic dysfunction of cervical region: Secondary | ICD-10-CM | POA: Diagnosis not present

## 2021-10-11 DIAGNOSIS — M9903 Segmental and somatic dysfunction of lumbar region: Secondary | ICD-10-CM | POA: Diagnosis not present

## 2021-10-18 DIAGNOSIS — M9906 Segmental and somatic dysfunction of lower extremity: Secondary | ICD-10-CM | POA: Diagnosis not present

## 2021-10-18 DIAGNOSIS — M9901 Segmental and somatic dysfunction of cervical region: Secondary | ICD-10-CM | POA: Diagnosis not present

## 2021-10-18 DIAGNOSIS — M5441 Lumbago with sciatica, right side: Secondary | ICD-10-CM | POA: Diagnosis not present

## 2021-10-18 DIAGNOSIS — M9903 Segmental and somatic dysfunction of lumbar region: Secondary | ICD-10-CM | POA: Diagnosis not present

## 2021-10-25 DIAGNOSIS — M9903 Segmental and somatic dysfunction of lumbar region: Secondary | ICD-10-CM | POA: Diagnosis not present

## 2021-10-25 DIAGNOSIS — M5441 Lumbago with sciatica, right side: Secondary | ICD-10-CM | POA: Diagnosis not present

## 2021-10-25 DIAGNOSIS — M9901 Segmental and somatic dysfunction of cervical region: Secondary | ICD-10-CM | POA: Diagnosis not present

## 2021-10-25 DIAGNOSIS — M9906 Segmental and somatic dysfunction of lower extremity: Secondary | ICD-10-CM | POA: Diagnosis not present

## 2021-11-06 DIAGNOSIS — M9906 Segmental and somatic dysfunction of lower extremity: Secondary | ICD-10-CM | POA: Diagnosis not present

## 2021-11-06 DIAGNOSIS — M9901 Segmental and somatic dysfunction of cervical region: Secondary | ICD-10-CM | POA: Diagnosis not present

## 2021-11-06 DIAGNOSIS — M5441 Lumbago with sciatica, right side: Secondary | ICD-10-CM | POA: Diagnosis not present

## 2021-11-06 DIAGNOSIS — M9903 Segmental and somatic dysfunction of lumbar region: Secondary | ICD-10-CM | POA: Diagnosis not present

## 2021-11-08 DIAGNOSIS — M5441 Lumbago with sciatica, right side: Secondary | ICD-10-CM | POA: Diagnosis not present

## 2021-11-08 DIAGNOSIS — M9906 Segmental and somatic dysfunction of lower extremity: Secondary | ICD-10-CM | POA: Diagnosis not present

## 2021-11-08 DIAGNOSIS — M9901 Segmental and somatic dysfunction of cervical region: Secondary | ICD-10-CM | POA: Diagnosis not present

## 2021-11-08 DIAGNOSIS — M9903 Segmental and somatic dysfunction of lumbar region: Secondary | ICD-10-CM | POA: Diagnosis not present

## 2021-11-15 DIAGNOSIS — M9906 Segmental and somatic dysfunction of lower extremity: Secondary | ICD-10-CM | POA: Diagnosis not present

## 2021-11-15 DIAGNOSIS — M9903 Segmental and somatic dysfunction of lumbar region: Secondary | ICD-10-CM | POA: Diagnosis not present

## 2021-11-15 DIAGNOSIS — M9901 Segmental and somatic dysfunction of cervical region: Secondary | ICD-10-CM | POA: Diagnosis not present

## 2021-11-15 DIAGNOSIS — M5441 Lumbago with sciatica, right side: Secondary | ICD-10-CM | POA: Diagnosis not present

## 2021-11-22 DIAGNOSIS — M9901 Segmental and somatic dysfunction of cervical region: Secondary | ICD-10-CM | POA: Diagnosis not present

## 2021-11-22 DIAGNOSIS — M9906 Segmental and somatic dysfunction of lower extremity: Secondary | ICD-10-CM | POA: Diagnosis not present

## 2021-11-22 DIAGNOSIS — M9903 Segmental and somatic dysfunction of lumbar region: Secondary | ICD-10-CM | POA: Diagnosis not present

## 2021-11-22 DIAGNOSIS — M5441 Lumbago with sciatica, right side: Secondary | ICD-10-CM | POA: Diagnosis not present

## 2021-11-27 ENCOUNTER — Other Ambulatory Visit: Payer: Self-pay | Admitting: Internal Medicine

## 2021-11-29 DIAGNOSIS — M9906 Segmental and somatic dysfunction of lower extremity: Secondary | ICD-10-CM | POA: Diagnosis not present

## 2021-11-29 DIAGNOSIS — M5441 Lumbago with sciatica, right side: Secondary | ICD-10-CM | POA: Diagnosis not present

## 2021-11-29 DIAGNOSIS — M9901 Segmental and somatic dysfunction of cervical region: Secondary | ICD-10-CM | POA: Diagnosis not present

## 2021-11-29 DIAGNOSIS — M9903 Segmental and somatic dysfunction of lumbar region: Secondary | ICD-10-CM | POA: Diagnosis not present

## 2021-12-06 DIAGNOSIS — M9906 Segmental and somatic dysfunction of lower extremity: Secondary | ICD-10-CM | POA: Diagnosis not present

## 2021-12-06 DIAGNOSIS — M9903 Segmental and somatic dysfunction of lumbar region: Secondary | ICD-10-CM | POA: Diagnosis not present

## 2021-12-06 DIAGNOSIS — M9901 Segmental and somatic dysfunction of cervical region: Secondary | ICD-10-CM | POA: Diagnosis not present

## 2021-12-06 DIAGNOSIS — M5441 Lumbago with sciatica, right side: Secondary | ICD-10-CM | POA: Diagnosis not present

## 2021-12-07 DIAGNOSIS — Z1231 Encounter for screening mammogram for malignant neoplasm of breast: Secondary | ICD-10-CM | POA: Diagnosis not present

## 2021-12-13 DIAGNOSIS — M5441 Lumbago with sciatica, right side: Secondary | ICD-10-CM | POA: Diagnosis not present

## 2021-12-13 DIAGNOSIS — M9903 Segmental and somatic dysfunction of lumbar region: Secondary | ICD-10-CM | POA: Diagnosis not present

## 2021-12-13 DIAGNOSIS — M9906 Segmental and somatic dysfunction of lower extremity: Secondary | ICD-10-CM | POA: Diagnosis not present

## 2021-12-13 DIAGNOSIS — M9901 Segmental and somatic dysfunction of cervical region: Secondary | ICD-10-CM | POA: Diagnosis not present

## 2021-12-20 DIAGNOSIS — M9903 Segmental and somatic dysfunction of lumbar region: Secondary | ICD-10-CM | POA: Diagnosis not present

## 2021-12-20 DIAGNOSIS — M9906 Segmental and somatic dysfunction of lower extremity: Secondary | ICD-10-CM | POA: Diagnosis not present

## 2021-12-20 DIAGNOSIS — M9901 Segmental and somatic dysfunction of cervical region: Secondary | ICD-10-CM | POA: Diagnosis not present

## 2021-12-20 DIAGNOSIS — M5441 Lumbago with sciatica, right side: Secondary | ICD-10-CM | POA: Diagnosis not present

## 2021-12-21 DIAGNOSIS — H6592 Unspecified nonsuppurative otitis media, left ear: Secondary | ICD-10-CM | POA: Diagnosis not present

## 2021-12-27 DIAGNOSIS — M9903 Segmental and somatic dysfunction of lumbar region: Secondary | ICD-10-CM | POA: Diagnosis not present

## 2021-12-27 DIAGNOSIS — M5441 Lumbago with sciatica, right side: Secondary | ICD-10-CM | POA: Diagnosis not present

## 2021-12-27 DIAGNOSIS — M9906 Segmental and somatic dysfunction of lower extremity: Secondary | ICD-10-CM | POA: Diagnosis not present

## 2021-12-27 DIAGNOSIS — M9901 Segmental and somatic dysfunction of cervical region: Secondary | ICD-10-CM | POA: Diagnosis not present

## 2021-12-30 ENCOUNTER — Ambulatory Visit (INDEPENDENT_AMBULATORY_CARE_PROVIDER_SITE_OTHER): Payer: Medicare HMO

## 2021-12-30 DIAGNOSIS — Z0000 Encounter for general adult medical examination without abnormal findings: Secondary | ICD-10-CM

## 2021-12-30 NOTE — Patient Instructions (Signed)

## 2021-12-30 NOTE — Progress Notes (Signed)
Subjective:   Sara Obrien is a 76 y.o. female who presents for Medicare Annual (Subsequent) preventive examination. I connected with  Sara Obrien on 12/30/21 by a audio enabled telemedicine application and verified that I am speaking with the correct person using two identifiers.  Patient Location: Home  Provider Location: Office/Clinic  I discussed the limitations of evaluation and management by telemedicine. The patient expressed understanding and agreed to proceed.       Objective:    Today's Vitals   12/30/21 1451  PainSc: 0-No pain   There is no height or weight on file to calculate BMI.     12/30/2021    2:56 PM 12/29/2020    8:41 AM 12/31/2019   11:23 AM 12/16/2018    2:57 PM  Advanced Directives  Does Patient Have a Medical Advance Directive? Yes Yes No No  Type of Advance Directive  Living will    Does patient want to make changes to medical advance directive? Yes (ED - Information included in AVS)     Would patient like information on creating a medical advance directive?   No - Patient declined     Current Medications (verified) Outpatient Encounter Medications as of 12/30/2021  Medication Sig   atorvastatin (LIPITOR) 20 MG tablet TAKE 1 TABLET EVERY DAY   COVID-19 mRNA bivalent vaccine, Pfizer, (PFIZER COVID-19 VAC BIVALENT) injection Inject into the muscle.   escitalopram (LEXAPRO) 20 MG tablet Take 1 tablet (20 mg total) by mouth daily.   olmesartan-hydrochlorothiazide (BENICAR HCT) 40-12.5 MG tablet TAKE 1 TABLET EVERY DAY   Omega-3 Fatty Acids (OMEGA 3 500 PO) Take by mouth. (Patient not taking: Reported on 07/06/2021)   TURMERIC PO Take 1,300 mg by mouth daily.   [DISCONTINUED] diclofenac (VOLTAREN) 75 MG EC tablet Take 75 mg by mouth 2 (two) times daily as needed.   No facility-administered encounter medications on file as of 12/30/2021.    Allergies (verified) Patient has no known allergies.   History: Past Medical History:  Diagnosis Date    Anxiety    Atypical chest pain 07/01/2017   Essential hypertension 07/01/2017   Gum disease    dentures worn   Hyperlipidemia    Hypertension    Migraines    Past Surgical History:  Procedure Laterality Date   BREAST LUMPECTOMY     TUBAL LIGATION     Family History  Problem Relation Age of Onset   Brain cancer Mother    Hypertension Mother    Stroke Father    Hypertension Father    Hypertension Sister    Cancer Brother    Cancer Brother    Cancer Brother    Breast cancer Sister    Social History   Socioeconomic History   Marital status: Married    Spouse name: Not on file   Number of children: Not on file   Years of education: Not on file   Highest education level: Not on file  Occupational History   Occupation: semi- retired  Tobacco Use   Smoking status: Never   Smokeless tobacco: Never  Vaping Use   Vaping Use: Never used  Substance and Sexual Activity   Alcohol use: Yes    Comment: glass of wine   Drug use: No   Sexual activity: Not Currently    Birth control/protection: Abstinence  Other Topics Concern   Not on file  Social History Narrative   Not on file   Social Determinants of Health   Financial  Resource Strain: Low Risk  (12/30/2021)   Overall Financial Resource Strain (CARDIA)    Difficulty of Paying Living Expenses: Not hard at all  Food Insecurity: No Food Insecurity (12/30/2021)   Hunger Vital Sign    Worried About Running Out of Food in the Last Year: Never true    Ran Out of Food in the Last Year: Never true  Transportation Needs: No Transportation Needs (12/30/2021)   PRAPARE - Hydrologist (Medical): No    Lack of Transportation (Non-Medical): No  Physical Activity: Sufficiently Active (12/30/2021)   Exercise Vital Sign    Days of Exercise per Week: 7 days    Minutes of Exercise per Session: 60 min  Stress: No Stress Concern Present (12/30/2021)   [place]    Feeling of Stress : Not at all  Social Connections: [place] (12/30/2021)   Social Connection and Isolation Panel [NHANES]    Frequency of Communication with Friends and Family: More than three times a week    Frequency of Social Gatherings with Friends and Family: More than three times a week    Attends Religious Services: More than 4 times per year    Active Member of [employer] or Organizations: Yes    Attends Music therapist: More than 4 times per year    Marital Status: Married    Tobacco Counseling Counseling given: Not Answered   Clinical Intake:  Pre-visit preparation completed: Yes  Pain : No/denies pain Pain Score: 0-No pain     Diabetes: No  How often do you need to have someone help you when you read instructions, pamphlets, or other written materials from your doctor or pharmacy?: 1 - Never What is the last grade level you completed in school?: masters in [place]?: No      Activities of Daily Living    12/30/2021    2:56 PM  In your present state of health, do you have any difficulty performing the following activities:  Hearing? 0  Vision? 0  Difficulty concentrating or making decisions? 0  Walking or climbing stairs? 0  Dressing or bathing? 0  Doing errands, shopping? 0  Preparing Food and eating ? N  Using the Toilet? N  In the past six months, have you accidently leaked urine? Y  Do you have problems with loss of bowel control? N  Managing your Medications? N  Managing your Finances? N  Housekeeping or managing your Housekeeping? N    Patient Care Team: [name], MD as PCP - General (Internal Medicine)  Indicate any recent Medical Services you may have received from other than [facility] providers in the past year (date may be approximate).     Assessment:   This is a routine wellness examination for [facility].  Hearing/Vision screen No results  found.  Dietary issues and exercise activities discussed:     Goals Addressed             This Visit's Progress    Weight (lb) < 200 lb (90.7 kg)        Depression Screen    12/30/2021    2:56 PM 12/30/2021    2:55 PM 08/08/2021   10:18 AM 12/29/2020    8:42 AM 12/31/2019   11:24 AM 01/26/2019    3:41 PM 12/17/2018   11:03 AM  PHQ 2/9 Scores  PHQ - 2 Score 0 0 3 0  0 0 0  PHQ- 9 Score   15    0    Fall Risk    12/30/2021    2:56 PM 12/29/2020    8:42 AM 12/31/2019   11:24 AM 01/26/2019    3:41 PM 12/17/2018   11:03 AM  [place] in the past year? 0 0 0 0 1  Number falls in past yr: 0    0  Injury with Fall? 0    1  Risk for fall due to : No Fall Risks Medication side effect Medication side effect    Follow up Falls evaluation completed Falls evaluation completed;Education provided;Falls prevention discussed Falls evaluation completed;Education provided;Falls prevention discussed      FALL RISK PREVENTION PERTAINING TO THE HOME:  Any stairs in or around the home? Yes  If so, are there any without handrails? No  Home free of loose throw rugs in walkways, pet beds, electrical cords, etc? Yes  Adequate lighting in your home to reduce risk of falls? Yes   ASSISTIVE DEVICES UTILIZED TO PREVENT FALLS:  Life alert? No  Use of a cane, walker or w/c? No  Grab bars in the bathroom? Yes  Shower chair or bench in shower? Yes  Elevated toilet seat or a handicapped toilet? No   Cognitive Function:        12/30/2021    2:57 PM 12/29/2020    8:43 AM 12/31/2019   11:25 AM 12/16/2018    3:05 PM 12/16/2018    3:02 PM  6CIT Screen  What Year? 0 points 0 points 0 points 0 points 0 points  What month? 0 points 0 points 0 points 0 points 0 points  What time? 0 points 0 points 0 points 0 points 0 points  Count back from 20 0 points 0 points 0 points 0 points 0 points  Months in reverse 0 points 0 points 0 points 0 points 0 points  Repeat phrase 0 points 2 points 2 points 0  points 0 points  Total Score 0 points 2 points 2 points 0 points 0 points    Immunizations Immunization History  Administered Date(s) Administered   Fluad Quad(high Dose 65+) 12/10/2019   Influenza, High Dose Seasonal PF 12/17/2018, 12/13/2020   Influenza-Unspecified 12/01/2020, 12/18/2021   PFIZER(Purple Top)SARS-COV-2 Vaccination 05/08/2019, 06/02/2019, 02/13/2020, 07/22/2020   PNEUMOCOCCAL CONJUGATE-20 08/08/2021   Pfizer Covid-19 Vaccine Bivalent Booster 34yr & up 03/20/2021   Tdap 09/22/2013   Zoster, Live 07/01/2016    TDAP status: Up to date  Flu Vaccine status: Up to date  Pneumococcal vaccine status: Up to date  Covid-19 vaccine status: Information provided on how to obtain vaccines.   Qualifies for Shingles Vaccine? Yes   Zostavax completed No   Shingrix Completed?: No.    Education has been provided regarding the importance of this vaccine. Patient has been advised to call insurance company to determine out of pocket expense if they have not yet received this vaccine. Advised may also receive vaccine at local pharmacy or Health Dept. Verbalized acceptance and understanding.  Screening Tests Health Maintenance  Topic Date Due   Zoster Vaccines- Shingrix (1 of 2) Never done   COVID-19 Vaccine (6 - Pfizer risk series) 05/15/2021   MAMMOGRAM  12/07/2021   TETANUS/TDAP  09/23/2023   Pneumonia Vaccine 76 Years old  Completed   INFLUENZA VACCINE  Completed   DEXA SCAN  Completed   Hepatitis C Screening  Completed   HPV VACCINES  Aged Out  COLONOSCOPY (Pts 45-27yr Insurance coverage will need to be confirmed)  Discontinued    Health Maintenance  Health Maintenance Due  Topic Date Due   Zoster Vaccines- Shingrix (1 of 2) Never done   COVID-19 Vaccine (6 - Pfizer risk series) 05/15/2021   MAMMOGRAM  12/07/2021    Colorectal cancer screening: No longer required.   Mammogram status: Completed 12/2021. Repeat every year  Bone Density status: Ordered  . Pt  provided with contact info and advised to call to schedule appt.  Lung Cancer Screening: (Low Dose CT Chest recommended if Age 76-80years, 30 pack-year currently smoking OR have quit w/in 15years.) does not qualify.   Lung Cancer Screening Referral: na  Additional Screening:  Hepatitis C Screening: does qualify; Completed yes  Vision Screening: Recommended annual ophthalmology exams for early detection of glaucoma and other disorders of the eye. Is the patient up to date with their annual eye exam?  Yes  Who is the provider or what is the name of the office in which the patient attends annual eye exams? [facility] If pt is not established with a provider, would they like to be referred to a provider to establish care?  established .   Dental Screening: Recommended annual dental exams for proper oral hygiene  Community Resource Referral / Chronic Care Management: CRR required this visit?  No   CCM required this visit?  No      Plan:     I have personally reviewed and noted the following in the patient's chart:   Medical and social history Use of alcohol, tobacco or illicit drugs  Current medications and supplements including opioid prescriptions. Patient is not currently taking opioid prescriptions. Functional ability and status Nutritional status Physical activity Advanced directives List of other physicians Hospitalizations, surgeries, and ER visits in previous 12 months Vitals Screenings to include cognitive, depression, and falls Referrals and appointments  In addition, I have reviewed and discussed with patient certain preventive protocols, quality metrics, and best practice recommendations. A written personalized care plan for preventive services as well as general preventive health recommendations were provided to patient.     TOctavio Manns  12/30/2021   Nurse Notes:   Ms. SHendriks, Thank you for taking time to come for your Medicare Wellness Visit. I  appreciate your ongoing commitment to your health goals. Please review the following plan we discussed and let me know if I can assist you in the future.   These are the goals we discussed:  Goals      Patient Stated     12/31/2019, wants to lose 20 pounds     Patient Stated     12/29/2020, wants to weigh 140 pounds     Weight (lb) < 200 lb (90.7 kg)     12/16/2018, wants to weigh 140 pounds     Weight (lb) < 200 lb (90.7 kg)        This is a list of the screening recommended for you and due dates:  Health Maintenance  Topic Date Due   Zoster (Shingles) Vaccine (1 of 2) Never done   COVID-19 Vaccine (6 - Pfizer risk series) 05/15/2021   Mammogram  12/07/2021   Tetanus Vaccine  09/23/2023   Pneumonia Vaccine  Completed   Flu Shot  Completed   DEXA scan (bone density measurement)  Completed   Hepatitis C Screening: USPSTF Recommendation to screen - Ages 18-79yo.  Completed   HPV Vaccine  Aged Out  Colon Cancer Screening  Discontinued

## 2022-01-01 ENCOUNTER — Other Ambulatory Visit: Payer: Self-pay | Admitting: Internal Medicine

## 2022-01-01 DIAGNOSIS — Z0000 Encounter for general adult medical examination without abnormal findings: Secondary | ICD-10-CM

## 2022-01-10 DIAGNOSIS — M5441 Lumbago with sciatica, right side: Secondary | ICD-10-CM | POA: Diagnosis not present

## 2022-01-10 DIAGNOSIS — M9903 Segmental and somatic dysfunction of lumbar region: Secondary | ICD-10-CM | POA: Diagnosis not present

## 2022-01-10 DIAGNOSIS — M9901 Segmental and somatic dysfunction of cervical region: Secondary | ICD-10-CM | POA: Diagnosis not present

## 2022-01-10 DIAGNOSIS — M9906 Segmental and somatic dysfunction of lower extremity: Secondary | ICD-10-CM | POA: Diagnosis not present

## 2022-01-11 ENCOUNTER — Encounter: Payer: Medicare HMO | Admitting: Internal Medicine

## 2022-01-19 ENCOUNTER — Ambulatory Visit (INDEPENDENT_AMBULATORY_CARE_PROVIDER_SITE_OTHER): Payer: Medicare HMO | Admitting: Internal Medicine

## 2022-01-19 ENCOUNTER — Encounter: Payer: Self-pay | Admitting: Internal Medicine

## 2022-01-19 VITALS — BP 140/70 | HR 79 | Temp 98.3°F | Ht 60.6 in | Wt 158.0 lb

## 2022-01-19 DIAGNOSIS — I129 Hypertensive chronic kidney disease with stage 1 through stage 4 chronic kidney disease, or unspecified chronic kidney disease: Secondary | ICD-10-CM | POA: Diagnosis not present

## 2022-01-19 DIAGNOSIS — E7800 Pure hypercholesterolemia, unspecified: Secondary | ICD-10-CM

## 2022-01-19 DIAGNOSIS — F419 Anxiety disorder, unspecified: Secondary | ICD-10-CM | POA: Diagnosis not present

## 2022-01-19 DIAGNOSIS — N182 Chronic kidney disease, stage 2 (mild): Secondary | ICD-10-CM

## 2022-01-19 DIAGNOSIS — E6609 Other obesity due to excess calories: Secondary | ICD-10-CM | POA: Diagnosis not present

## 2022-01-19 DIAGNOSIS — R7309 Other abnormal glucose: Secondary | ICD-10-CM | POA: Diagnosis not present

## 2022-01-19 DIAGNOSIS — R0601 Orthopnea: Secondary | ICD-10-CM | POA: Diagnosis not present

## 2022-01-19 DIAGNOSIS — S00452A Superficial foreign body of left ear, initial encounter: Secondary | ICD-10-CM | POA: Diagnosis not present

## 2022-01-19 DIAGNOSIS — Z23 Encounter for immunization: Secondary | ICD-10-CM

## 2022-01-19 DIAGNOSIS — Z6830 Body mass index (BMI) 30.0-30.9, adult: Secondary | ICD-10-CM | POA: Diagnosis not present

## 2022-01-19 DIAGNOSIS — Z0000 Encounter for general adult medical examination without abnormal findings: Secondary | ICD-10-CM

## 2022-01-19 LAB — POCT URINALYSIS DIPSTICK
Blood, UA: NEGATIVE
Glucose, UA: NEGATIVE
Ketones, UA: POSITIVE
Nitrite, UA: NEGATIVE
Protein, UA: POSITIVE — AB
Spec Grav, UA: 1.025 (ref 1.010–1.025)
Urobilinogen, UA: 0.2 E.U./dL
pH, UA: 5.5 (ref 5.0–8.0)

## 2022-01-19 NOTE — Progress Notes (Signed)
Jeri Cos [place],acting as a Neurosurgeon for Gwynneth Aliment, MD.,have documented all relevant documentation on the behalf of Gwynneth Aliment, MD,as directed by  Gwynneth Aliment, MD while in the presence of Gwynneth Aliment, MD.   Subjective:     Patient ID: Sara Obrien , female    DOB: February 18, 1946 , 76 y.o.   MRN: [id number]   Chief Complaint  Patient presents with   Annual Exam   Hypertension    HPI  Patient here for HM. She reports compliance with meds. She denies headaches, chest pain and shortness of breath. She has no new concerns at this time.   Hypertension This is a chronic problem. The current episode started more than 1 year ago. The problem has been gradually improving since onset. The problem is controlled. Associated symptoms include shortness of breath. Pertinent negatives include no blurred vision. Past treatments include angiotensin blockers and diuretics. The current treatment provides moderate improvement. There are no compliance problems.  Hypertensive end-organ damage includes kidney disease.     Past Medical History:  Diagnosis Date   Anxiety    Atypical chest pain 07/01/2017   Essential hypertension 07/01/2017   Gum disease    dentures worn   Hyperlipidemia    Hypertension    Migraines      Family History  Problem Relation Age of Onset   Brain cancer Mother    Hypertension Mother    Stroke Father    Hypertension Father    Hypertension Sister    Cancer Brother    Cancer Brother    Cancer Brother    Breast cancer Sister      Current Outpatient Medications:    atorvastatin (LIPITOR) 20 MG tablet, TAKE 1 TABLET EVERY DAY, Disp: 90 tablet, Rfl: 2   escitalopram (LEXAPRO) 20 MG tablet, TAKE 1 TABLET EVERY DAY, Disp: 90 tablet, Rfl: 2   olmesartan-hydrochlorothiazide (BENICAR HCT) 40-12.5 MG tablet, TAKE 1 TABLET EVERY DAY, Disp: 90 tablet, Rfl: 2   Omega-3 Fatty Acids (OMEGA 3 500 PO), Take by mouth., Disp: , Rfl:    TURMERIC PO, Take 1,300 mg by  mouth daily., Disp: , Rfl:    No Known Allergies   Men's preventive visit. Patient Health Questionnaire (PHQ-2) is  Flowsheet Row Office Visit from 01/19/2022 in [facility]  PHQ-2 Total Score 0      Relevant history for alcohol use is:  Social History   Substance and Sexual Activity  Alcohol Use Yes   Comment: glass of wine  . Relevant history for tobacco use is:  Social History   Tobacco Use  Smoking Status Never  Smokeless Tobacco Never  .   Review of Systems  Constitutional: Negative.   HENT: Negative.    Eyes: Negative.  Negative for blurred vision.  Respiratory:  Positive for shortness of breath.        She c/o shortness of breath when she lays flat. Denies cp, diaphoresis and palpitations. She is not sure what is triggering her sx, other than lying down. Denies DOE. Denies LE swelling. She plays pickleball without difficulty.   Cardiovascular: Negative.   Gastrointestinal: Negative.   Endocrine: Negative.   Genitourinary: Negative.   Musculoskeletal: Negative.   Skin: Negative.   Allergic/Immunologic: Negative.   Neurological: Negative.   Hematological: Negative.   Psychiatric/Behavioral:  The patient is nervous/anxious.        She reports she is under a great deal of stress. She is dealing with family  stressors. She reports compliance with meds. Feels she could benefit from therapy.      Today's Vitals   01/19/22 1418  BP: (!) 140/70  Pulse: 79  Temp: 98.3 F (36.8 C)  Weight: 158 lb (71.7 kg)  Height: 5' 0.6" (1.539 m)  PainSc: 0-No pain   Body mass index is 30.25 kg/m.  Wt Readings from Last 3 Encounters:  01/19/22 158 lb (71.7 kg)  08/08/21 160 lb 3.2 oz (72.7 kg)  07/06/21 160 lb 6.4 oz (72.8 kg)     Objective:  Physical Exam Vitals and nursing note reviewed.  Constitutional:      Appearance: Normal appearance.  HENT:     Head: Normocephalic and atraumatic.     Right Ear: Tympanic membrane, ear canal and external  ear normal.     Left Ear: Ear canal and external ear normal.     Ears:     Comments: Large amount of cotton in left ear. Unable to visualize TM.     Nose:     Comments: Masked     Mouth/Throat:     Comments: Masked  Eyes:     Extraocular Movements: Extraocular movements intact.     Conjunctiva/sclera: Conjunctivae normal.     Pupils: Pupils are equal, round, and reactive to light.  Cardiovascular:     Rate and Rhythm: Normal rate and regular rhythm.     Pulses: Normal pulses.     Heart sounds: Normal heart sounds.  Pulmonary:     Effort: Pulmonary effort is normal.     Breath sounds: Normal breath sounds.  Chest:  Breasts:    Tanner Score is 5.     Right: Normal.     Left: Normal.  Abdominal:     General: Abdomen is flat. Bowel sounds are normal.     Palpations: Abdomen is soft.  Genitourinary:    Comments: deferred Musculoskeletal:        General: Normal range of motion.     Cervical back: Normal range of motion and neck supple.  Skin:    General: Skin is warm and dry.  Neurological:     General: No focal deficit present.     Mental Status: She is alert and oriented to person, place, and time.  Psychiatric:        Mood and Affect: Mood normal.        Behavior: Behavior normal.      Assessment And Plan:    1. Annual physical exam Comments: A full exam was performed. Importance of monthly self breast exams was discussed with the patient. PATIENT IS ADVISED TO GET 30-45 MINUTES REGULAR EXERCISE NO LESS THAN FOUR TO FIVE DAYS PER WEEK - BOTH WEIGHTBEARING EXERCISES AND AEROBIC ARE RECOMMENDED.  PATIENT IS ADVISED TO FOLLOW A HEALTHY DIET WITH AT LEAST SIX FRUITS/VEGGIES PER DAY, DECREASE INTAKE OF RED MEAT, AND TO INCREASE FISH INTAKE TO TWO DAYS PER WEEK.  MEATS/FISH SHOULD NOT BE FRIED, BAKED OR BROILED IS PREFERABLE.  IT IS ALSO IMPORTANT TO CUT BACK ON YOUR SUGAR INTAKE. PLEASE AVOID ANYTHING WITH ADDED SUGAR, CORN SYRUP OR OTHER SWEETENERS. IF YOU MUST USE A SWEETENER,  YOU CAN TRY STEVIA. IT IS ALSO IMPORTANT TO AVOID ARTIFICIALLY SWEETENERS AND DIET BEVERAGES. LASTLY, I SUGGEST WEARING SPF 50 SUNSCREEN ON EXPOSED PARTS AND ESPECIALLY WHEN IN THE DIRECT SUNLIGHT FOR AN EXTENDED PERIOD OF TIME.  PLEASE AVOID FAST FOOD RESTAURANTS AND INCREASE YOUR WATER INTAKE.  2. Hypertensive nephropathy Comments: Chronic, fair control.  EKG performed, NSR w/o acute changes. She will c/w current meds. She will rto in 6 months for re-evaluation. Goal BP<130/80 , optimal readings are <120/80. - Microalbumin / Creatinine Urine Ratio - POCT Urinalysis Dipstick (81002) - EKG 12-Lead - CBC no Diff - CMP14+EGFR - Hemoglobin A1c - Lipid panel  3. Chronic renal disease, stage II Comments: Chronic, she is encouraged to avoid NSAIDs, stay hydrated and keep BP well controlled to decrease cardiac risk.  - CMP14+EGFR  4. Foreign body in ear lobe, left, initial encounter Comments: After obtaining verbal consent, left ear was flushed by irrigation. Cotton was removed without complications. No TM abnormalities were noted.  - Ear Lavage  5. Anxiety Comments: She agrees to referral for therapy.  She will continue with escitalopram.  - Ambulatory referral to Psychology  6. Orthopnea Comments: I will check BNP.   7. Pure hypercholesterolemia - Lipid panel  8. Class 1 obesity due to excess calories with serious comorbidity and body mass index (BMI) of 30.0 to 30.9 in adult Comments: She is encouraged to aim for at least 150 minutes of exercise per week, while striving for BMI<30 to decrease cardiac risk.   9. Immunization due - Varicella-zoster vaccine IM   Patient was given opportunity to ask questions. Patient verbalized understanding of the plan and was able to repeat key elements of the plan. All questions were answered to their satisfaction.   I, Gwynneth Aliment, MD, have reviewed all documentation for this visit. The documentation on 01/19/22 for the exam, diagnosis,  procedures, and orders are all accurate and complete.   THE PATIENT IS ENCOURAGED TO PRACTICE SOCIAL DISTANCING DUE TO THE COVID-19 PANDEMIC.

## 2022-01-19 NOTE — Patient Instructions (Signed)

## 2022-01-20 LAB — HEMOGLOBIN A1C
Est. average glucose Bld gHb Est-mCnc: 123 mg/dL
Hgb A1c MFr Bld: 5.9 % — ABNORMAL HIGH (ref 4.8–5.6)

## 2022-01-20 LAB — CMP14+EGFR
ALT: 20 IU/L (ref 0–32)
AST: 31 IU/L (ref 0–40)
Albumin/Globulin Ratio: 1.4 (ref 1.2–2.2)
Albumin: 4.6 g/dL (ref 3.8–4.8)
Alkaline Phosphatase: 82 IU/L (ref 44–121)
BUN/Creatinine Ratio: 15 (ref 12–28)
BUN: 18 mg/dL (ref 8–27)
Bilirubin Total: 0.4 mg/dL (ref 0.0–1.2)
CO2: 27 mmol/L (ref 20–29)
Calcium: 10.3 mg/dL (ref 8.7–10.3)
Chloride: 94 mmol/L — ABNORMAL LOW (ref 96–106)
Creatinine, Ser: 1.18 mg/dL — ABNORMAL HIGH (ref 0.57–1.00)
Globulin, Total: 3.4 g/dL (ref 1.5–4.5)
Glucose: 92 mg/dL (ref 70–99)
Potassium: 4.2 mmol/L (ref 3.5–5.2)
Sodium: 137 mmol/L (ref 134–144)
Total Protein: 8 g/dL (ref 6.0–8.5)
eGFR: 48 mL/min/{1.73_m2} — ABNORMAL LOW (ref >59)

## 2022-01-20 LAB — LIPID PANEL
Chol/HDL Ratio: 3.6 ratio (ref 0.0–4.4)
Cholesterol, Total: 166 mg/dL (ref 100–199)
HDL: 46 mg/dL (ref >39)
LDL Chol Calc (NIH): 103 mg/dL — ABNORMAL HIGH (ref 0–99)
Triglycerides: 94 mg/dL (ref 0–149)
VLDL Cholesterol Cal: 17 mg/dL (ref 5–40)

## 2022-01-20 LAB — MICROALBUMIN / CREATININE URINE RATIO
Creatinine, Urine: 275.9 mg/dL
Microalb/Creat Ratio: 7 mg/g creat (ref 0–29)
Microalbumin, Urine: 20.4 ug/mL

## 2022-01-20 LAB — CBC
Hematocrit: 40.4 % (ref 34.0–46.6)
Hemoglobin: 13.4 g/dL (ref 11.1–15.9)
MCH: 28.8 pg (ref 26.6–33.0)
MCHC: 33.2 g/dL (ref 31.5–35.7)
MCV: 87 fL (ref 79–97)
Platelets: 404 10*3/uL (ref 150–450)
RBC: 4.66 x10E6/uL (ref 3.77–5.28)
RDW: 14.2 % (ref 11.7–15.4)
WBC: 5.5 10*3/uL (ref 3.4–10.8)

## 2022-01-22 ENCOUNTER — Other Ambulatory Visit: Payer: Self-pay | Admitting: Internal Medicine

## 2022-01-22 DIAGNOSIS — R0601 Orthopnea: Secondary | ICD-10-CM

## 2022-01-23 ENCOUNTER — Other Ambulatory Visit: Payer: Medicare HMO

## 2022-01-23 DIAGNOSIS — R0601 Orthopnea: Secondary | ICD-10-CM | POA: Diagnosis not present

## 2022-01-24 ENCOUNTER — Encounter: Payer: Self-pay | Admitting: Internal Medicine

## 2022-01-24 DIAGNOSIS — M5441 Lumbago with sciatica, right side: Secondary | ICD-10-CM | POA: Diagnosis not present

## 2022-01-24 DIAGNOSIS — M9906 Segmental and somatic dysfunction of lower extremity: Secondary | ICD-10-CM | POA: Diagnosis not present

## 2022-01-24 DIAGNOSIS — M9903 Segmental and somatic dysfunction of lumbar region: Secondary | ICD-10-CM | POA: Diagnosis not present

## 2022-01-24 DIAGNOSIS — M9901 Segmental and somatic dysfunction of cervical region: Secondary | ICD-10-CM | POA: Diagnosis not present

## 2022-01-24 LAB — BRAIN NATRIURETIC PEPTIDE: BNP: 49.5 pg/mL (ref 0.0–100.0)

## 2022-01-25 ENCOUNTER — Other Ambulatory Visit: Payer: Self-pay | Admitting: Internal Medicine

## 2022-01-25 DIAGNOSIS — E6609 Other obesity due to excess calories: Secondary | ICD-10-CM

## 2022-02-01 ENCOUNTER — Ambulatory Visit: Payer: Medicare HMO | Admitting: Internal Medicine

## 2022-02-01 ENCOUNTER — Ambulatory Visit: Payer: Medicare HMO

## 2022-02-07 DIAGNOSIS — M9906 Segmental and somatic dysfunction of lower extremity: Secondary | ICD-10-CM | POA: Diagnosis not present

## 2022-02-07 DIAGNOSIS — M5441 Lumbago with sciatica, right side: Secondary | ICD-10-CM | POA: Diagnosis not present

## 2022-02-07 DIAGNOSIS — M9903 Segmental and somatic dysfunction of lumbar region: Secondary | ICD-10-CM | POA: Diagnosis not present

## 2022-02-07 DIAGNOSIS — M9901 Segmental and somatic dysfunction of cervical region: Secondary | ICD-10-CM | POA: Diagnosis not present

## 2022-02-28 DIAGNOSIS — M5441 Lumbago with sciatica, right side: Secondary | ICD-10-CM | POA: Diagnosis not present

## 2022-02-28 DIAGNOSIS — M9901 Segmental and somatic dysfunction of cervical region: Secondary | ICD-10-CM | POA: Diagnosis not present

## 2022-02-28 DIAGNOSIS — M9903 Segmental and somatic dysfunction of lumbar region: Secondary | ICD-10-CM | POA: Diagnosis not present

## 2022-02-28 DIAGNOSIS — M9906 Segmental and somatic dysfunction of lower extremity: Secondary | ICD-10-CM | POA: Diagnosis not present

## 2022-03-07 DIAGNOSIS — M9906 Segmental and somatic dysfunction of lower extremity: Secondary | ICD-10-CM | POA: Diagnosis not present

## 2022-03-07 DIAGNOSIS — M9903 Segmental and somatic dysfunction of lumbar region: Secondary | ICD-10-CM | POA: Diagnosis not present

## 2022-03-07 DIAGNOSIS — M9901 Segmental and somatic dysfunction of cervical region: Secondary | ICD-10-CM | POA: Diagnosis not present

## 2022-03-07 DIAGNOSIS — M5441 Lumbago with sciatica, right side: Secondary | ICD-10-CM | POA: Diagnosis not present

## 2022-03-20 ENCOUNTER — Other Ambulatory Visit: Payer: Self-pay

## 2022-03-20 MED ORDER — COMIRNATY 30 MCG/0.3ML IM SUSY
0.3000 mL | PREFILLED_SYRINGE | INTRAMUSCULAR | 0 refills | Status: DC
  Filled 2022-03-20: qty 0.3, 1d supply, fill #0

## 2022-03-21 DIAGNOSIS — M9901 Segmental and somatic dysfunction of cervical region: Secondary | ICD-10-CM | POA: Diagnosis not present

## 2022-03-21 DIAGNOSIS — M5441 Lumbago with sciatica, right side: Secondary | ICD-10-CM | POA: Diagnosis not present

## 2022-03-21 DIAGNOSIS — M9903 Segmental and somatic dysfunction of lumbar region: Secondary | ICD-10-CM | POA: Diagnosis not present

## 2022-03-21 DIAGNOSIS — M9906 Segmental and somatic dysfunction of lower extremity: Secondary | ICD-10-CM | POA: Diagnosis not present

## 2022-04-04 DIAGNOSIS — M9906 Segmental and somatic dysfunction of lower extremity: Secondary | ICD-10-CM | POA: Diagnosis not present

## 2022-04-04 DIAGNOSIS — M9903 Segmental and somatic dysfunction of lumbar region: Secondary | ICD-10-CM | POA: Diagnosis not present

## 2022-04-04 DIAGNOSIS — M5441 Lumbago with sciatica, right side: Secondary | ICD-10-CM | POA: Diagnosis not present

## 2022-04-04 DIAGNOSIS — M9901 Segmental and somatic dysfunction of cervical region: Secondary | ICD-10-CM | POA: Diagnosis not present

## 2022-04-05 ENCOUNTER — Encounter: Payer: Self-pay | Admitting: Internal Medicine

## 2022-04-11 DIAGNOSIS — M9906 Segmental and somatic dysfunction of lower extremity: Secondary | ICD-10-CM | POA: Diagnosis not present

## 2022-04-11 DIAGNOSIS — M9901 Segmental and somatic dysfunction of cervical region: Secondary | ICD-10-CM | POA: Diagnosis not present

## 2022-04-11 DIAGNOSIS — M5441 Lumbago with sciatica, right side: Secondary | ICD-10-CM | POA: Diagnosis not present

## 2022-04-11 DIAGNOSIS — M9903 Segmental and somatic dysfunction of lumbar region: Secondary | ICD-10-CM | POA: Diagnosis not present

## 2022-04-25 DIAGNOSIS — M9903 Segmental and somatic dysfunction of lumbar region: Secondary | ICD-10-CM | POA: Diagnosis not present

## 2022-04-25 DIAGNOSIS — M5441 Lumbago with sciatica, right side: Secondary | ICD-10-CM | POA: Diagnosis not present

## 2022-04-25 DIAGNOSIS — M9901 Segmental and somatic dysfunction of cervical region: Secondary | ICD-10-CM | POA: Diagnosis not present

## 2022-04-25 DIAGNOSIS — M9906 Segmental and somatic dysfunction of lower extremity: Secondary | ICD-10-CM | POA: Diagnosis not present

## 2022-05-02 DIAGNOSIS — M5441 Lumbago with sciatica, right side: Secondary | ICD-10-CM | POA: Diagnosis not present

## 2022-05-02 DIAGNOSIS — M9903 Segmental and somatic dysfunction of lumbar region: Secondary | ICD-10-CM | POA: Diagnosis not present

## 2022-05-02 DIAGNOSIS — M9901 Segmental and somatic dysfunction of cervical region: Secondary | ICD-10-CM | POA: Diagnosis not present

## 2022-05-02 DIAGNOSIS — M9906 Segmental and somatic dysfunction of lower extremity: Secondary | ICD-10-CM | POA: Diagnosis not present

## 2022-05-09 DIAGNOSIS — M9901 Segmental and somatic dysfunction of cervical region: Secondary | ICD-10-CM | POA: Diagnosis not present

## 2022-05-09 DIAGNOSIS — M9906 Segmental and somatic dysfunction of lower extremity: Secondary | ICD-10-CM | POA: Diagnosis not present

## 2022-05-09 DIAGNOSIS — M9903 Segmental and somatic dysfunction of lumbar region: Secondary | ICD-10-CM | POA: Diagnosis not present

## 2022-05-09 DIAGNOSIS — M5441 Lumbago with sciatica, right side: Secondary | ICD-10-CM | POA: Diagnosis not present

## 2022-05-16 DIAGNOSIS — M9901 Segmental and somatic dysfunction of cervical region: Secondary | ICD-10-CM | POA: Diagnosis not present

## 2022-05-16 DIAGNOSIS — M5441 Lumbago with sciatica, right side: Secondary | ICD-10-CM | POA: Diagnosis not present

## 2022-05-16 DIAGNOSIS — M9906 Segmental and somatic dysfunction of lower extremity: Secondary | ICD-10-CM | POA: Diagnosis not present

## 2022-05-16 DIAGNOSIS — M9903 Segmental and somatic dysfunction of lumbar region: Secondary | ICD-10-CM | POA: Diagnosis not present

## 2022-05-23 DIAGNOSIS — M9901 Segmental and somatic dysfunction of cervical region: Secondary | ICD-10-CM | POA: Diagnosis not present

## 2022-05-23 DIAGNOSIS — M9906 Segmental and somatic dysfunction of lower extremity: Secondary | ICD-10-CM | POA: Diagnosis not present

## 2022-05-23 DIAGNOSIS — M9903 Segmental and somatic dysfunction of lumbar region: Secondary | ICD-10-CM | POA: Diagnosis not present

## 2022-05-23 DIAGNOSIS — M5441 Lumbago with sciatica, right side: Secondary | ICD-10-CM | POA: Diagnosis not present

## 2022-05-28 ENCOUNTER — Ambulatory Visit (INDEPENDENT_AMBULATORY_CARE_PROVIDER_SITE_OTHER): Payer: Medicare HMO | Admitting: Family Medicine

## 2022-05-28 ENCOUNTER — Encounter (INDEPENDENT_AMBULATORY_CARE_PROVIDER_SITE_OTHER): Payer: Self-pay | Admitting: Family Medicine

## 2022-05-28 VITALS — BP 142/81 | HR 61 | Temp 97.9°F | Ht 60.0 in | Wt 159.0 lb

## 2022-05-28 DIAGNOSIS — R7303 Prediabetes: Secondary | ICD-10-CM | POA: Diagnosis not present

## 2022-05-28 DIAGNOSIS — Z6831 Body mass index (BMI) 31.0-31.9, adult: Secondary | ICD-10-CM

## 2022-05-28 DIAGNOSIS — Z0289 Encounter for other administrative examinations: Secondary | ICD-10-CM

## 2022-05-28 DIAGNOSIS — E785 Hyperlipidemia, unspecified: Secondary | ICD-10-CM

## 2022-05-28 DIAGNOSIS — E669 Obesity, unspecified: Secondary | ICD-10-CM | POA: Insufficient documentation

## 2022-05-28 DIAGNOSIS — I10 Essential (primary) hypertension: Secondary | ICD-10-CM | POA: Diagnosis not present

## 2022-05-28 NOTE — Assessment & Plan Note (Signed)
Lab Results  Component Value Date   CHOL 166 01/19/2022   HDL 46 01/19/2022   LDLCALC 103 (H) 01/19/2022   TRIG 94 01/19/2022   CHOLHDL 3.6 01/19/2022   Reviewed last cholesterol panel from October 2023. Reports good compliance with taking atorvastatin 20 mg daily without adverse side effect  Look for improved cholesterol levels with lifestyle change.

## 2022-05-28 NOTE — Assessment & Plan Note (Signed)
Lab Results  Component Value Date   HGBA1C 5.9 (H) 01/19/2022   Reviewed most recent A1c result with patient from October 2023 at 5.9 consistent with prediabetes.  Patient has been intaking excess amounts of added sugar and refined carbohydrates especially late at night.  We discussed the importance of reducing intake of added sugar and reducing nighttime snacking.  Look for improvements in A1c numbers with lifestyle change and weight loss.

## 2022-05-28 NOTE — Assessment & Plan Note (Signed)
BP elevated at 142/81 today Reports good compliance with taking olmesartan/HCTZ 40/12.5 mg daily without adverse side effect  Begin working on reducing intake of high sodium foods and adding in more consistent walking Look for blood pressure improvements with weight reduction.

## 2022-05-28 NOTE — Progress Notes (Signed)
Office: [phone]  /  Fax: [phone]   Initial Visit  Sara Obrien was seen in clinic today to evaluate for obesity. She is interested in losing weight to improve overall health and reduce the risk of weight related complications. She presents today to review program treatment options, initial physical assessment, and evaluation.     She was referred by: PCP  When asked what else they would like to accomplish? She states: Improve energy levels and physical activity and Improve existing medical conditions  Max weight : 190 lb Happy weight: 130 lb- 145 lbs  When asked how has your weight affected you? She states: Contributed to medical problems and Contributed to orthopedic problems or mobility issues  Some associated conditions: Hypertension, Arthritis:back, and Hyperlipidemia  Contributing factors: Other: none  Weight promoting medications identified: None  Current nutrition plan: None  Current level of physical activity: None and Sports pickleball 3 days/ wk Used to do water aerobics, lives in a walkable area and has 3 dogs  Current or previous pharmacotherapy: None  Response to medication: Never tried medications   Past medical history includes:   Past Medical History:  Diagnosis Date   Anxiety    Atypical chest pain 07/01/2017   Essential hypertension 07/01/2017   Gum disease    dentures worn   Hyperlipidemia    Hypertension    Migraines      Objective:   BP (!) 142/81   Pulse 61   Temp 97.9 F (36.6 C)   Ht 5' (1.524 m)   Wt 159 lb (72.1 kg)   SpO2 99%   BMI 31.05 kg/m  She was weighed on the bioimpedance scale: Body mass index is 31.05 kg/m.  Peak Weight:159 lbs , Body Fat%:41.7, Visceral Fat Rating:13, Weight trend over the last 12 months: Unchanged  General:  Alert, oriented and cooperative. Patient is in no acute distress.  Respiratory: Normal respiratory effort, no problems with respiration noted  Extremities: Normal range of motion.     Mental Status: Normal mood and affect. Normal behavior. Normal judgment and thought content.   DIAGNOSTIC DATA REVIEWED:  BMET    Component Value Date/Time   NA 137 01/19/2022 1540   K 4.2 01/19/2022 1540   CL 94 (L) 01/19/2022 1540   CO2 27 01/19/2022 1540   GLUCOSE 92 01/19/2022 1540   GLUCOSE 99 11/08/2010 1100   BUN 18 01/19/2022 1540   CREATININE 1.18 (H) 01/19/2022 1540   CALCIUM 10.3 01/19/2022 1540   GFRNONAA 63 12/31/2019 1429   GFRAA 73 12/31/2019 1429   Lab Results  Component Value Date   HGBA1C 5.9 (H) 01/19/2022   HGBA1C 5.6 12/17/2018   Lab Results  Component Value Date   INSULIN 9.7 06/16/2019   CBC    Component Value Date/Time   WBC 5.5 01/19/2022 1540   WBC 12.7 (H) 08/15/2008 1804   RBC 4.66 01/19/2022 1540   RBC 4.31 08/15/2008 1804   HGB 13.4 01/19/2022 1540   HCT 40.4 01/19/2022 1540   PLT 404 01/19/2022 1540   MCV 87 01/19/2022 1540   MCH 28.8 01/19/2022 1540   MCHC 33.2 01/19/2022 1540   MCHC 34.4 08/15/2008 1804   RDW 14.2 01/19/2022 1540   Iron/TIBC/Ferritin/ %Sat No results found for: "IRON", "TIBC", "FERRITIN", "IRONPCTSAT" Lipid Panel     Component Value Date/Time   CHOL 166 01/19/2022 1540   TRIG 94 01/19/2022 1540   HDL 46 01/19/2022 1540   CHOLHDL 3.6 01/19/2022 1540   [place]  103 (H) 01/19/2022 1540   Hepatic Function Panel     Component Value Date/Time   PROT 8.0 01/19/2022 1540   ALBUMIN 4.6 01/19/2022 1540   AST 31 01/19/2022 1540   ALT 20 01/19/2022 1540   ALKPHOS 82 01/19/2022 1540   BILITOT 0.4 01/19/2022 1540      Component Value Date/Time   TSH 0.854 07/18/2020 1222     Assessment and Plan:   Essential hypertension Assessment & Plan: BP elevated at 142/81 today Reports good compliance with taking olmesartan/HCTZ 40/12.5 mg daily without adverse side effect  Begin working on reducing intake of high sodium foods and adding in more consistent walking Look for blood pressure improvements with weight  reduction.    Generalized obesity Assessment & Plan: Reviewed program information and current bioimpedance results.   BMI 31.0-31.9,adult  Hyperlipidemia, unspecified hyperlipidemia type Assessment & Plan: Lab Results  Component Value Date   CHOL 166 01/19/2022   HDL 46 01/19/2022   LDLCALC 103 (H) 01/19/2022   TRIG 94 01/19/2022   CHOLHDL 3.6 01/19/2022   Reviewed last cholesterol panel from October 2023. Reports good compliance with taking atorvastatin 20 mg daily without adverse side effect  Look for improved cholesterol levels with lifestyle change.   Prediabetes Assessment & Plan: Lab Results  Component Value Date   HGBA1C 5.9 (H) 01/19/2022   Reviewed most recent A1c result with patient from October 2023 at 5.9 consistent with prediabetes.  Patient has been intaking excess amounts of added sugar and refined carbohydrates especially late at night.  We discussed the importance of reducing intake of added sugar and reducing nighttime snacking.  Look for improvements in A1c numbers with lifestyle change and weight loss.         Obesity Treatment / Action Plan:  Patient will work on garnering support from family and friends to begin weight loss journey. Will work on eliminating or reducing the presence of highly palatable, calorie dense foods in the home. Will complete provided nutritional and psychosocial assessment questionnaire before the next appointment. Will be scheduled for indirect calorimetry to determine resting energy expenditure in a fasting state.  This will allow [place] to create a reduced calorie, high-protein meal plan to promote loss of fat mass while preserving muscle mass. Will think about ideas on how to incorporate physical activity into their daily routine. Was counseled on nutritional approaches to weight loss and benefits of complex carbs and high quality protein as part of nutritional weight management.  Obesity Education Performed Today:  She  was weighed on the bioimpedance scale and results were discussed and documented in the synopsis.  We discussed obesity as a disease and the importance of a more detailed evaluation of all the factors contributing to the disease.  We discussed the importance of long term lifestyle changes which include nutrition, exercise and behavioral modifications as well as the importance of customizing this to her specific health and social needs.  We discussed the benefits of reaching a healthier weight to alleviate the symptoms of existing conditions and reduce the risks of the biomechanical, metabolic and psychological effects of obesity.  Sara Obrien appears to be in the action stage of change and states they are ready to start intensive lifestyle modifications and behavioral modifications.  30 minutes was spent today on this visit including the above counseling, pre-visit chart review, and post-visit documentation.  Reviewed by clinician on day of visit: allergies, medications, problem list, medical history, surgical history, family history, social history, and  previous encounter notes.   Loyal Gambler DO

## 2022-05-28 NOTE — Assessment & Plan Note (Signed)
Reviewed program information and current bioimpedance results.

## 2022-05-30 DIAGNOSIS — M5441 Lumbago with sciatica, right side: Secondary | ICD-10-CM | POA: Diagnosis not present

## 2022-05-30 DIAGNOSIS — M9903 Segmental and somatic dysfunction of lumbar region: Secondary | ICD-10-CM | POA: Diagnosis not present

## 2022-05-30 DIAGNOSIS — M9906 Segmental and somatic dysfunction of lower extremity: Secondary | ICD-10-CM | POA: Diagnosis not present

## 2022-05-30 DIAGNOSIS — M9901 Segmental and somatic dysfunction of cervical region: Secondary | ICD-10-CM | POA: Diagnosis not present

## 2022-06-06 DIAGNOSIS — M9901 Segmental and somatic dysfunction of cervical region: Secondary | ICD-10-CM | POA: Diagnosis not present

## 2022-06-06 DIAGNOSIS — M5441 Lumbago with sciatica, right side: Secondary | ICD-10-CM | POA: Diagnosis not present

## 2022-06-06 DIAGNOSIS — M9906 Segmental and somatic dysfunction of lower extremity: Secondary | ICD-10-CM | POA: Diagnosis not present

## 2022-06-06 DIAGNOSIS — M9903 Segmental and somatic dysfunction of lumbar region: Secondary | ICD-10-CM | POA: Diagnosis not present

## 2022-06-12 ENCOUNTER — Encounter (INDEPENDENT_AMBULATORY_CARE_PROVIDER_SITE_OTHER): Payer: Self-pay | Admitting: Family Medicine

## 2022-06-12 ENCOUNTER — Ambulatory Visit (INDEPENDENT_AMBULATORY_CARE_PROVIDER_SITE_OTHER): Payer: Medicare HMO | Admitting: Family Medicine

## 2022-06-12 VITALS — BP 159/74 | HR 58 | Temp 97.7°F | Ht 60.0 in | Wt 158.6 lb

## 2022-06-12 DIAGNOSIS — Z6830 Body mass index (BMI) 30.0-30.9, adult: Secondary | ICD-10-CM

## 2022-06-12 DIAGNOSIS — I152 Hypertension secondary to endocrine disorders: Secondary | ICD-10-CM | POA: Diagnosis not present

## 2022-06-12 DIAGNOSIS — E782 Mixed hyperlipidemia: Secondary | ICD-10-CM | POA: Diagnosis not present

## 2022-06-12 DIAGNOSIS — M159 Polyosteoarthritis, unspecified: Secondary | ICD-10-CM

## 2022-06-12 DIAGNOSIS — F39 Unspecified mood [affective] disorder: Secondary | ICD-10-CM

## 2022-06-12 DIAGNOSIS — E668 Other obesity: Secondary | ICD-10-CM

## 2022-06-12 DIAGNOSIS — G479 Sleep disorder, unspecified: Secondary | ICD-10-CM | POA: Diagnosis not present

## 2022-06-12 DIAGNOSIS — R5383 Other fatigue: Secondary | ICD-10-CM

## 2022-06-12 DIAGNOSIS — R0602 Shortness of breath: Secondary | ICD-10-CM

## 2022-06-12 DIAGNOSIS — E669 Obesity, unspecified: Secondary | ICD-10-CM

## 2022-06-12 NOTE — Progress Notes (Signed)
Sara Obrien, D.O.  AB[facility], [facility] Specializing in Clinical Bariatric Medicine  Office located at: [address], [place]  [address]     Dear Sara Chard, MD,   Thank you for referring LETRICIA Obrien to our clinic. The following note includes my evaluation and treatment recommendations.  Assessment and Plan:   Orders Placed This Encounter  Procedures   CBC with Differential/Platelet   Comprehensive metabolic panel   Hemoglobin A1c   Insulin, random   Lipid Panel With LDL/HDL Ratio   T4, free   TSH   Vitamin B12   VITAMIN D 25 Hydroxy (Vit-D Deficiency, Fractures)     Other fatigue SOB (shortness of breath) on exertion Assessment: Condition is Not optimized.. She reports shortness of breath and fatigue with climbing 2 flights of stairs and feels that this would improve with weight loss as she has noticed that this has progressively worsened with her weight gain.  Plan:Will work on prudent nutritional plan and exercise in effort toward weight reduction. Will check Vit D and B12 today.   Class 1 obesity with serious comorbidity and body mass index (BMI) of 31.0 to 31.9 in adult, unspecified obesity type Assessment: Condition is Not optimized.. Biometric data collected today, was reviewed with patient.  IC completed today shows a VO2 of 164 and a REE of 1123.  Her calculated basal metabolic rate is 123456 thus her measured basal metabolic rate is worse than expected. Fat mass is down .8lb, muscle mass is up .4lb, and total body water is .8lb since 05/28/22.  Plan:Will start category 1 meal plan. Thoroughly reviewed plan with her and specific food options. Strongly advised against skipping meals or any foods on plan. Will check A1c and insulin today.   Hypertension due to endocrine disorder Assessment: Condition is Not at goal.. Diagnosed 30 years or so ago. She states that her BP is typically better controlled outside of the office. Last 3 blood pressure  readings in our office are as follows: BP Readings from Last 3 Encounters:  06/12/22 (!) 159/74  05/28/22 (!) 142/81  01/19/22 (!) 140/70  The CVD Risk score (D'Agostino, et al., 2008) failed to calculate for the following reasons:   CVD risk score not calculated Lab Results  Component Value Date   CREATININE 1.18 (H) 01/19/2022  She reports compliance of Benicar HCT 40-12.'5mg'$  daily without any intolerance.  Plan: BP is not at goal today. Continue medication regimen as advised by PCP at this time. [place] on pathophysiology of disease and discussed treatment plan, which always includes dietary and lifestyle modification as first line.  Lifestyle changes such as following our low salt, heart healthy meal plan and engaging in a regular exercise program discussed  - Avoid buying foods that are: processed, frozen, or prepackaged to avoid excess salt. - Ambulatory blood pressure monitoring encouraged.  Reminded patient that if they ever feel poorly in any way, to check their blood pressure and pulse as well. - We will continue to monitor closely alongside PCP/ specialists.  Pt reminded to also f/up with those individuals as instructed by them.  - We will continue to monitor symptoms as they relate to the her weight loss journey. Will check CMP, CBC, TSH, and T4 today.    Mixed hyperlipidemia Assessment: Condition is At goal.. Labs were reviewed.  Diagnosed 30 years ago or so ago. Lab Results  Component Value Date   CHOL 166 01/19/2022   HDL 46 01/19/2022  LDLCALC 103 (H) 01/19/2022   TRIG 94 01/19/2022   CHOLHDL 3.6 01/19/2022  She is compliant with Lipitor '20mg'$  without any intolerance.  Plan:Continue Lipitor as managed by PCP. Will recheck lipid panel today.   Generalized OA Assessment: Condition is Controlled.. She reports pain primarily in her legs- she states that she has some pain secondary to a fall down stairs many years ago and a fall while playing  pickleball. She is seeing a chiropractor with good pain relief- particularly of her back.  Plan: Continue current regimen as this is controlling her pain.   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 She states that she has been on Lexapro for 20 years. She has stress associated with her caregiving responsibilities for her son, grandchildren, and marital stressors. She feels that her emotional eating is not controlled.  Plan:She is going to think about referral to Dr. Mallie Mussel and may like to be referred in the future for further strategies for emotional eating. Continue Lexapro as managed by PCP.    Sleep disturbance Assessment: Condition is Not optimized.. States that she wakes frequently at night but denies any snoring, PND, or witnessed apneic episodes. No Hx of OSA.  Plan:Advised her to discuss this further with her PCP Dr. Baird Cancer given her ESS of 9 today.   TREATMENT PLAN FOR OBESITY: Recommended Dietary Goals Aleane is currently in the action stage of change. As such, her goal is to continue weight management plan. She has agreed to start the Category 1 Plan.  Behavioral Intervention Additional resources provided today: category 1 meal plan information Evidence-based interventions for health behavior change were utilized today including the discussion of self monitoring techniques, problem-solving barriers and SMART goal setting techniques.   Regarding patient's less desirable eating habits and patterns, we employed the technique of small changes.  Pt will specifically work on: start meal plan and avoid skipping meals for next visit.    Recommended Physical Activity Goals Charnetta has been advised to work up to 150 minutes of moderate intensity aerobic activity a week and strengthening exercises 2-3 times per week for cardiovascular health, weight loss maintenance and preservation of muscle mass.  She has agreed to continue physical activity  as is.   FOLLOW UP: Follow up in 2 weeks. She was informed of the importance of frequent follow up visits to maximize her success with intensive lifestyle modifications for her multiple health conditions. She is aware that we will review all of her lab results at next visit, and she is aware that anything is life threatening we will be contact her prior to visit to discuss results.  Chief Complaint:   OBESITY AUDRIA ARGETSINGER (MR# [id number]) is a 77 y.o. female who presents for evaluation and treatment of obesity and related comorbidities. Current BMI is Body mass index is 30.97 kg/m. Latesa has been struggling with her weight for many years and has been unsuccessful in either losing weight, maintaining weight loss, or reaching her healthy weight goal.  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 Patient is married to her husband Mikki Santee  and has children. She lives with her 23yo husband and 71yo son.  ZAMIAH VANARTSDALEN is currently in the action stage of change and ready to dedicate time achieving and maintaining a healthier weight. Jannete is interested in becoming our patient and working on intensive lifestyle modifications including (but not limited to) diet and exercise for weight loss.  Nusayba Rybka Ore's habits were reviewed today and are as  follows: Her family eats meals together, she struggles with family and or coworkers weight loss sabotage, her desired weight loss is to 130lbs, her heaviest weight ever was 190lbs pounds, she snacks frequently in the evenings, she skips meals frequently, and she is frequently drinking liquids with calories.  She states that she gained weight which she attributes to depression and stress. Patient reports that her emotional eating is limiting her weight loss. She does emotionally eat. She drinks regular soda daily. She states that her worst eating habit is snacking in the evenings- particularly cookies. She craves cookies and ice cream. She often skips breakfast  and/or lunch.  She had some success with weight loss with fasting B12 injections, and weight watchers in the past.  She plays pickleball exercising 180 minutes total each week..  Depression Screen Her Food and Mood (modified PHQ-9) score was 19.  Sleep habits Her ESS is 9. She states that she wakes up frequently throughout the night and naps during the day. She denies any snoring or witnessed apneic episodes.  Subjective:   This is the patient's first visit at [facility].  The patient's NEW PATIENT PACKET that they filled out prior to today's office visit was reviewed at length and information from that paperwork was included within the following office visit note.    Included in the packet: current and past health history, medications, allergies, ROS, gynecologic history (women only), surgical history, family history, social history, weight history, weight loss surgery history (for those that have had weight loss surgery), nutritional evaluation, mood and food questionnaire along with a depression screening (PHQ9) on all patients, an Epworth questionnaire, sleep habits questionnaire, patient life and health improvement goals questionnaire. These will all be scanned into the patient's chart under media.   Review of Systems: Please refer to new patient packet scanned into media. Pertinent positives were addressed with patient today.  Objective:   PHYSICAL EXAM: Blood pressure (!) 159/74, pulse (!) 58, temperature 97.7 F (36.5 C), height 5' (1.524 m), weight 158 lb 9.6 oz (71.9 kg), SpO2 98 %. Body mass index is 30.97 kg/m. General: Well Developed, well nourished, and in no acute distress.  HEENT: Normocephalic, atraumatic Skin: Warm and dry, cap RF less 2 sec, good turgor Chest:  Normal excursion, shape, no gross abn Respiratory: speaking in full sentences, no conversational dyspnea NeuroM-Sk: Ambulates w/o assistance, moves * 4 Psych: A and O *3, insight good,  mood-full  EKG 01/19/2022: Normal sinus rhythm, rate 68bpm, no acute abnormalities.  Indirect Calorimeter completed today shows a VO2 of 164 and a REE of 1123.  Her calculated basal metabolic rate is 123456 thus her measured basal metabolic rate is worse than expected.  Anthropometric Measurements Height: 5' (1.524 m) Weight: 158 lb 9.6 oz (71.9 kg) BMI (Calculated): 30.97 Starting Weight: 158.6lb Peak Weight: 190lb Waist Measurement : 39 inches   Body Composition  Body Fat %: 41.3 % Fat Mass (lbs): 65.6 lbs Muscle Mass (lbs): 88.2 lbs Total Body Water (lbs): 61.6 lbs Visceral Fat Rating : 13   Other Clinical Data RMR: 1123 Fasting: yes Labs: yes Today's Visit #: 1 Starting Date: 06/12/22    DIAGNOSTIC DATA REVIEWED:  BMET    Component Value Date/Time   NA 137 01/19/2022 1540   K 4.2 01/19/2022 1540   CL 94 (L) 01/19/2022 1540   CO2 27 01/19/2022 1540   GLUCOSE 92 01/19/2022 1540   GLUCOSE 99 11/08/2010 1100   BUN 18 01/19/2022 1540  CREATININE 1.18 (H) 01/19/2022 1540   CALCIUM 10.3 01/19/2022 1540   GFRNONAA 63 12/31/2019 1429   GFRAA 73 12/31/2019 1429   Lab Results  Component Value Date   HGBA1C 5.9 (H) 01/19/2022   HGBA1C 5.6 12/17/2018   Lab Results  Component Value Date   INSULIN 9.7 06/16/2019   Lab Results  Component Value Date   TSH 0.854 07/18/2020   CBC    Component Value Date/Time   WBC 5.5 01/19/2022 1540   WBC 12.7 (H) 08/15/2008 1804   RBC 4.66 01/19/2022 1540   RBC 4.31 08/15/2008 1804   HGB 13.4 01/19/2022 1540   HCT 40.4 01/19/2022 1540   PLT 404 01/19/2022 1540   MCV 87 01/19/2022 1540   MCH 28.8 01/19/2022 1540   MCHC 33.2 01/19/2022 1540   MCHC 34.4 08/15/2008 1804   RDW 14.2 01/19/2022 1540   Iron Studies No results found for: "IRON", "TIBC", "FERRITIN", "IRONPCTSAT" Lipid Panel     Component Value Date/Time   CHOL 166 01/19/2022 1540   TRIG 94 01/19/2022 1540   HDL 46 01/19/2022 1540   CHOLHDL 3.6 01/19/2022  1540   LDLCALC 103 (H) 01/19/2022 1540   Hepatic Function Panel     Component Value Date/Time   PROT 8.0 01/19/2022 1540   ALBUMIN 4.6 01/19/2022 1540   AST 31 01/19/2022 1540   ALT 20 01/19/2022 1540   ALKPHOS 82 01/19/2022 1540   BILITOT 0.4 01/19/2022 1540      Component Value Date/Time   TSH 0.854 07/18/2020 1222   Nutritional Lab Results  Component Value Date   VD25OH 53.2 12/31/2019    Attestation Statements:   Reviewed by clinician on day of visit: allergies, medications, problem list, medical history, surgical history, family history, social history, and previous encounter notes.  During the visit, I independently reviewed the patient's EKG, bioimpedance scale results, and indirect calorimeter results. I used this information to tailor a meal plan for the patient that will help DIORE KAPOLKA to lose weight and will improve her obesity-related conditions going forward.  I performed a medically necessary appropriate examination and/or evaluation. I discussed the assessment and treatment plan with the patient. The patient was provided an opportunity to ask questions and all were answered. The patient agreed with the plan and demonstrated an understanding of the instructions. Labs were ordered today (unless patient declined them) and will be reviewed with the patient at our next visit unless more critical results need to be addressed immediately. Clinical information was updated and documented in the EMR.  Time spent on visit including pre-visit chart review and post-visit care was estimated to be 60 *** minutes.  A separate 40 minutes was spent on risk counseling (see above/below).    I,Alexis Herring,acting as a Education administrator for [employer], DO.,have documented all relevant documentation on the behalf of Mellody Dance, DO,as directed by  Mellody Dance, DO while in the presence of Mellody Dance, DO.   I, Mellody Dance, DO, have reviewed all documentation for this  visit. The documentation on 06/12/22 for the exam, diagnosis, procedures, and orders are all accurate and complete.

## 2022-06-13 DIAGNOSIS — M9903 Segmental and somatic dysfunction of lumbar region: Secondary | ICD-10-CM | POA: Diagnosis not present

## 2022-06-13 DIAGNOSIS — M9901 Segmental and somatic dysfunction of cervical region: Secondary | ICD-10-CM | POA: Diagnosis not present

## 2022-06-13 DIAGNOSIS — M9906 Segmental and somatic dysfunction of lower extremity: Secondary | ICD-10-CM | POA: Diagnosis not present

## 2022-06-13 DIAGNOSIS — M5441 Lumbago with sciatica, right side: Secondary | ICD-10-CM | POA: Diagnosis not present

## 2022-06-14 LAB — CBC WITH DIFFERENTIAL/PLATELET
Basophils Absolute: 0 10*3/uL (ref 0.0–0.2)
Basos: 1 %
EOS (ABSOLUTE): 0.1 10*3/uL (ref 0.0–0.4)
Eos: 3 %
Hematocrit: 40.8 % (ref 34.0–46.6)
Hemoglobin: 13.2 g/dL (ref 11.1–15.9)
Immature Grans (Abs): 0 10*3/uL (ref 0.0–0.1)
Immature Granulocytes: 0 %
Lymphocytes Absolute: 1.8 10*3/uL (ref 0.7–3.1)
Lymphs: 41 %
MCH: 28.7 pg (ref 26.6–33.0)
MCHC: 32.4 g/dL (ref 31.5–35.7)
MCV: 89 fL (ref 79–97)
Monocytes Absolute: 0.4 10*3/uL (ref 0.1–0.9)
Monocytes: 9 %
Neutrophils Absolute: 2 10*3/uL (ref 1.4–7.0)
Neutrophils: 46 %
Platelets: 372 10*3/uL (ref 150–450)
RBC: 4.6 x10E6/uL (ref 3.77–5.28)
RDW: 14 % (ref 11.7–15.4)
WBC: 4.3 10*3/uL (ref 3.4–10.8)

## 2022-06-14 LAB — COMPREHENSIVE METABOLIC PANEL
ALT: 13 IU/L (ref 0–32)
AST: 20 IU/L (ref 0–40)
Albumin/Globulin Ratio: 1.2 (ref 1.2–2.2)
Albumin: 4.2 g/dL (ref 3.8–4.8)
Alkaline Phosphatase: 85 IU/L (ref 44–121)
BUN/Creatinine Ratio: 15 (ref 12–28)
BUN: 12 mg/dL (ref 8–27)
Bilirubin Total: 0.3 mg/dL (ref 0.0–1.2)
CO2: 23 mmol/L (ref 20–29)
Calcium: 10 mg/dL (ref 8.7–10.3)
Chloride: 100 mmol/L (ref 96–106)
Creatinine, Ser: 0.8 mg/dL (ref 0.57–1.00)
Globulin, Total: 3.4 g/dL (ref 1.5–4.5)
Glucose: 89 mg/dL (ref 70–99)
Potassium: 4.1 mmol/L (ref 3.5–5.2)
Sodium: 141 mmol/L (ref 134–144)
Total Protein: 7.6 g/dL (ref 6.0–8.5)
eGFR: 76 mL/min/{1.73_m2} (ref >59)

## 2022-06-14 LAB — TSH: TSH: 0.724 u[IU]/mL (ref 0.450–4.500)

## 2022-06-14 LAB — LIPID PANEL WITH LDL/HDL RATIO
Cholesterol, Total: 262 mg/dL — ABNORMAL HIGH (ref 100–199)
HDL: 61 mg/dL (ref >39)
LDL Chol Calc (NIH): 185 mg/dL — ABNORMAL HIGH (ref 0–99)
LDL/HDL Ratio: 3 ratio (ref 0.0–3.2)
Triglycerides: 93 mg/dL (ref 0–149)
VLDL Cholesterol Cal: 16 mg/dL (ref 5–40)

## 2022-06-14 LAB — T4, FREE: Free T4: 1.28 ng/dL (ref 0.82–1.77)

## 2022-06-14 LAB — HEMOGLOBIN A1C
Est. average glucose Bld gHb Est-mCnc: 123 mg/dL
Hgb A1c MFr Bld: 5.9 % — ABNORMAL HIGH (ref 4.8–5.6)

## 2022-06-14 LAB — VITAMIN D 25 HYDROXY (VIT D DEFICIENCY, FRACTURES): Vit D, 25-Hydroxy: 28.9 ng/mL — ABNORMAL LOW (ref 30.0–100.0)

## 2022-06-14 LAB — VITAMIN B12: Vitamin B-12: >2000 pg/mL — ABNORMAL HIGH (ref 232–1245)

## 2022-06-14 LAB — INSULIN, RANDOM: INSULIN: 11.8 u[IU]/mL (ref 2.6–24.9)

## 2022-06-20 DIAGNOSIS — M9903 Segmental and somatic dysfunction of lumbar region: Secondary | ICD-10-CM | POA: Diagnosis not present

## 2022-06-20 DIAGNOSIS — M9906 Segmental and somatic dysfunction of lower extremity: Secondary | ICD-10-CM | POA: Diagnosis not present

## 2022-06-20 DIAGNOSIS — M5441 Lumbago with sciatica, right side: Secondary | ICD-10-CM | POA: Diagnosis not present

## 2022-06-20 DIAGNOSIS — M9901 Segmental and somatic dysfunction of cervical region: Secondary | ICD-10-CM | POA: Diagnosis not present

## 2022-06-21 DIAGNOSIS — M9906 Segmental and somatic dysfunction of lower extremity: Secondary | ICD-10-CM | POA: Diagnosis not present

## 2022-06-21 DIAGNOSIS — M9901 Segmental and somatic dysfunction of cervical region: Secondary | ICD-10-CM | POA: Diagnosis not present

## 2022-06-21 DIAGNOSIS — M5441 Lumbago with sciatica, right side: Secondary | ICD-10-CM | POA: Diagnosis not present

## 2022-06-21 DIAGNOSIS — M9903 Segmental and somatic dysfunction of lumbar region: Secondary | ICD-10-CM | POA: Diagnosis not present

## 2022-06-26 ENCOUNTER — Ambulatory Visit (INDEPENDENT_AMBULATORY_CARE_PROVIDER_SITE_OTHER): Payer: Medicare HMO | Admitting: Family Medicine

## 2022-06-26 ENCOUNTER — Encounter (INDEPENDENT_AMBULATORY_CARE_PROVIDER_SITE_OTHER): Payer: Self-pay | Admitting: Family Medicine

## 2022-06-26 VITALS — BP 145/82 | HR 62 | Temp 98.1°F | Ht 60.0 in | Wt 156.6 lb

## 2022-06-26 DIAGNOSIS — E559 Vitamin D deficiency, unspecified: Secondary | ICD-10-CM | POA: Insufficient documentation

## 2022-06-26 DIAGNOSIS — I152 Hypertension secondary to endocrine disorders: Secondary | ICD-10-CM

## 2022-06-26 DIAGNOSIS — R7303 Prediabetes: Secondary | ICD-10-CM | POA: Diagnosis not present

## 2022-06-26 DIAGNOSIS — E669 Obesity, unspecified: Secondary | ICD-10-CM | POA: Diagnosis not present

## 2022-06-26 DIAGNOSIS — E782 Mixed hyperlipidemia: Secondary | ICD-10-CM | POA: Diagnosis not present

## 2022-06-26 DIAGNOSIS — R748 Abnormal levels of other serum enzymes: Secondary | ICD-10-CM | POA: Diagnosis not present

## 2022-06-26 DIAGNOSIS — Z6830 Body mass index (BMI) 30.0-30.9, adult: Secondary | ICD-10-CM | POA: Diagnosis not present

## 2022-06-26 DIAGNOSIS — R7989 Other specified abnormal findings of blood chemistry: Secondary | ICD-10-CM | POA: Insufficient documentation

## 2022-06-26 MED ORDER — VITAMIN D (ERGOCALCIFEROL) 1.25 MG (50000 UNIT) PO CAPS: 50000.0000 [IU] | ORAL_CAPSULE | ORAL | 0 refills | Status: DC

## 2022-06-26 NOTE — Progress Notes (Signed)
Sara Obrien, D.O.  ABFM, ABOM Specializing in Clinical Bariatric Medicine  Office located at: [address], [place]  [id number]     Assessment and Plan:   No orders of the defined types were placed in this encounter.   Medications Discontinued During This Encounter  Medication Reason   TURMERIC PO Completed Course     Meds ordered this encounter  Medications   Vitamin D, Ergocalciferol, (DRISDOL) 1.25 MG (50000 UNIT) CAPS capsule    Sig: Take 1 capsule (50,000 Units total) by mouth every 7 (seven) days.    Dispense:  4 capsule    Refill:  0     Prediabetes  Assessment: Condition is Not optimized. Labs were reviewed. This is a new condition. Lab Results  Component Value Date   HGBA1C 5.9 (H) 06/12/2022   HGBA1C 5.9 (H) 01/19/2022   HGBA1C 5.8 (H) 07/06/2021   INSULIN 11.8 06/12/2022   INSULIN 9.7 06/16/2019  She has a family history of prediabetes. Patient is not currently on any medication for this condition. This is completely diet controlled.   Plan: - Continue to decrease simple carbs/ sugars; increase fiber and proteins -> follow her meal plan.   - Explained role of simple carbs and insulin levels on hunger and cravings - Marvin will continue to work on weight loss, exercise, via their meal plan we devised to help decrease the risk of progressing to diabetes.    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  Labs were reviewed.  Last 3 blood pressure readings in our office are as follows: BP Readings from Last 3 Encounters:  06/26/22 (!) 145/82  06/12/22 (!) 159/74  05/28/22 (!) 142/81   Lab Results  Component Value Date   CREATININE 0.80 06/12/2022  She reports good compliance and tolerance with Benicar 40-12.5 mg daily. Asymptomatic.    Plan: Continue with Benicar 40-12.5 mg daily as recommended by PCP.  -Continue with Prudent nutritional plan and low sodium diet, advance exercise as  tolerated.   - I informed her that taking OTC medications may affect kidney and liver functions.  - We will continue to monitor closely alongside PCP/ specialists.  Pt reminded to also f/up with those individuals as instructed by them.    Mixed hyperlipidemia Assessment: Condition is Not optimized. Labs were reviewed.  Lab Results  Component Value Date   CHOL 262 (H) 06/12/2022   HDL 61 06/12/2022   LDLCALC 185 (H) 06/12/2022   TRIG 93 06/12/2022   CHOLHDL 3.6 01/19/2022  She has been compliant with Lipitor 20 mg daily. Denies any side effects.   Plan: Continue with Lipitor 20 mg daily as recommended by PCP. - She will discuss with Dr.Sanders (her PCP) about possibly increasing the Lipitor dosage or switching to Crestor for her high LDL.  - I emphasized that high LDL may increase the likelihood of heart attack and strokes. Karry Zieske will continue to work on diet, exercise and weight loss efforts.  - I stressed the importance that patient continue with our prudent nutritional plan that is low in saturated and trans fats, and low in fatty carbs to improve these numbers.   - We will continue routine screening as patient continues to achieve health goals along their weight loss journey   Vitamin D Deficiency  Assessment: Condition is Not optimized. Labs were reviewed. Lab Results  Component Value Date   VD25OH 28.9 (L) 06/12/2022   VD25OH 53.2 12/31/2019  Patient is not taking any Vitamin D supplements. This is currently being managed by diet.   Plan: Begin Ergocalciferol 50K IU weekly  - I discussed the importance of vitamin D to the patient's health and well-being.  - I reminded patient about getting a bone density test every 2 years.  - I reviewed possible symptoms of low Vitamin D:  low energy, depressed mood, muscle aches, joint aches, osteoporosis etc. was reviewed with patient - low Vitamin D levels may be linked to an increased risk of cardiovascular events and even increased  risk of cancers- such as colon and breast.  - weight loss will likely improve availability of vitamin D, thus encouraged Hidie to continue with meal plan and their weight loss efforts to further improve this condition.  Thus, we will need to monitor levels regularly (every 3-4 mo on average) to keep levels within normal limits and prevent over supplementation.   Elevated Vitamin B12 Level Assessment: Condition is Not optimized. Labs were reviewed.  Lab Results  Component Value Date   VITAMINB12 >2000 (H) 06/12/2022  Her Vitamin B12 is significantly above the normal levels. She is taking OTC Vitamin B12 supplement daily, but does not remember the exact dosage. She began this on her own accord to improve energy levels.   Plan: For next visit, she will inform me of the exact Vitamin B12 dosage that she is using. For now, I recommended her to take the supplement every other day due to the elevated Vitamin B12, unless it is very high a dosage such as 5K in which she will take it once a week.  - I informed her that an appropriate supplement dose of B12 is around 500 mcg per day.  - Continue her prudent nutritional plan and continue to advance exercise and cardiovascular fitness as tolerated.    TREATMENT PLAN FOR OBESITY: Obesity with current bmi 30.6 (start bmi 30.97/date 06/12/22) Assessment: Condition is Improving, but not optimized.. Biometric data collected today, was reviewed with patient.  Fat mass has decreased by 2.4lb. Muscle mass has increased by .4lb. Total body water has decreased by .6lb.   Plan: Continue with  Category 1 meal plan with lunch options. I showed her how different fruits have different calories. We discussed different recipes and meals that she can try to prepare to avoid getting bored of the nutritional plan.   Education regarding management of these chronic disease states was given. Management strategies discussed on successive visits include dietary recommendations,  goals of achieving and maintaining BMI < 30, and lifestyle modifications such as exercise.  Behavioral Intervention Additional resources provided today: category 1 meal plan information and lunch options, prediabetes handout.  Evidence-based interventions for health behavior change were utilized today including the discussion of self monitoring techniques, problem-solving barriers and SMART goal setting techniques.   Regarding patient's less desirable eating habits and patterns, we employed the technique of small changes.  Pt will specifically work on: continue following the Category 1 meal plan with lunch options as closely as possible for next visit.    Recommended Physical Activity Goals Matika has been advised to work up to 150 minutes of moderate intensity aerobic activity a week and strengthening exercises 2-3 times per week for cardiovascular health, weight loss maintenance and preservation of muscle mass.  She has agreed to Continue current level of physical activity   FOLLOW UP: Return in about 2 weeks (around 07/10/2022). She was informed of the importance of frequent follow up visits to maximize her  success with intensive lifestyle modifications for her multiple health conditions.  Subjective:   Chief complaint: Obesity Mishal is here to discuss her progress with her obesity treatment plan. She is on the the Category 1 Plan and states she is following her eating plan approximately 75 % of the time. She states she is exercising 60 minutes 7 days per week.  Interval History:  LEE-ANN CAVAZOS is here today for her first follow-up office visit since starting the program with [place]. All blood work/ lab tests that were recently ordered by myself or an outside provider were reviewed with patient today per their request. Extended time was spent counseling her on all new disease processes that were discovered or preexisting ones that are affected by BMI.  she understands that many of these  abnormalities will need to monitored regularly along with the current treatment plan of prudent dietary changes, in which we are making each and every office visit, to improve these health parameters.  Since last office visit she states that is was challenging to eat all the food on the meal plan.  She recalls that on the 18th, when she ate all the food on the meal plan,she had no cravings. She has also been weighing her foods regularly.    Review of Systems:  Pertinent positives were addressed with patient today.  Weight Summary and Biometrics   See biometric data in separate tab that was reviewed with pt today   Objective:   PHYSICAL EXAM:  Blood pressure (!) 145/82, pulse 62, temperature 98.1 F (36.7 C), height 5' (1.524 m), weight 156 lb 9.6 oz (71 kg), SpO2 99 %. Body mass index is 30.58 kg/m.  General: Well Developed, well nourished, and in no acute distress.  HEENT: Normocephalic, atraumatic Skin: Warm and dry, cap RF less 2 sec, good turgor Chest:  Normal excursion, shape, no gross abn Respiratory: speaking in full sentences, no conversational dyspnea NeuroM-Sk: Ambulates w/o assistance, moves * 4 Psych: A and O *3, insight good, mood-full  DIAGNOSTIC DATA REVIEWED:  BMET    Component Value Date/Time   NA 141 06/12/2022 1227   K 4.1 06/12/2022 1227   CL 100 06/12/2022 1227   CO2 23 06/12/2022 1227   GLUCOSE 89 06/12/2022 1227   GLUCOSE 99 11/08/2010 1100   BUN 12 06/12/2022 1227   CREATININE 0.80 06/12/2022 1227   CALCIUM 10.0 06/12/2022 1227   GFRNONAA 63 12/31/2019 1429   GFRAA 73 12/31/2019 1429   Lab Results  Component Value Date   HGBA1C 5.9 (H) 06/12/2022   HGBA1C 5.6 12/17/2018   Lab Results  Component Value Date   INSULIN 11.8 06/12/2022   INSULIN 9.7 06/16/2019   Lab Results  Component Value Date   TSH 0.724 06/12/2022   CBC    Component Value Date/Time   WBC 4.3 06/12/2022 1227   WBC 12.7 (H) 08/15/2008 1804   RBC 4.60 06/12/2022  1227   RBC 4.31 08/15/2008 1804   HGB 13.2 06/12/2022 1227   HCT 40.8 06/12/2022 1227   PLT 372 06/12/2022 1227   MCV 89 06/12/2022 1227   MCH 28.7 06/12/2022 1227   MCHC 32.4 06/12/2022 1227   MCHC 34.4 08/15/2008 1804   RDW 14.0 06/12/2022 1227   Iron Studies No results found for: "IRON", "TIBC", "FERRITIN", "IRONPCTSAT" Lipid Panel     Component Value Date/Time   CHOL 262 (H) 06/12/2022 1227   TRIG 93 06/12/2022 1227   HDL 61 06/12/2022 1227   CHOLHDL 3.6  01/19/2022 1540   LDLCALC 185 (H) 06/12/2022 1227   Hepatic Function Panel     Component Value Date/Time   PROT 7.6 06/12/2022 1227   ALBUMIN 4.2 06/12/2022 1227   AST 20 06/12/2022 1227   ALT 13 06/12/2022 1227   ALKPHOS 85 06/12/2022 1227   BILITOT 0.3 06/12/2022 1227      Component Value Date/Time   TSH 0.724 06/12/2022 1227   Nutritional Lab Results  Component Value Date   VD25OH 28.9 (L) 06/12/2022   VD25OH 53.2 12/31/2019    Attestations:   Reviewed by clinician on day of visit: allergies, medications, problem list, medical history, surgical history, family history, social history, and previous encounter notes.   Patient was in the office today and time spent on visit including pre-visit chart review and post-visit care/coordination of care and electronic medical record documentation was 60 minutes. 50% of the time was in face to face counseling of this patient's medical condition(s) and providing education on treatment options to include the first-line treatment of diet and lifestyle modification.  I,Special Puri,acting as a Education administrator for [employer], DO.,have documented all relevant documentation on the behalf of Mellody Dance, DO,as directed by  Mellody Dance, DO while in the presence of Mellody Dance, DO.   I, Mellody Dance, DO, have reviewed all documentation for this visit. The documentation on 07/03/22 for the exam, diagnosis, procedures, and orders are all accurate and complete.

## 2022-06-27 DIAGNOSIS — M9901 Segmental and somatic dysfunction of cervical region: Secondary | ICD-10-CM | POA: Diagnosis not present

## 2022-06-27 DIAGNOSIS — M9906 Segmental and somatic dysfunction of lower extremity: Secondary | ICD-10-CM | POA: Diagnosis not present

## 2022-06-27 DIAGNOSIS — M5441 Lumbago with sciatica, right side: Secondary | ICD-10-CM | POA: Diagnosis not present

## 2022-06-27 DIAGNOSIS — M9903 Segmental and somatic dysfunction of lumbar region: Secondary | ICD-10-CM | POA: Diagnosis not present

## 2022-07-04 DIAGNOSIS — M9901 Segmental and somatic dysfunction of cervical region: Secondary | ICD-10-CM | POA: Diagnosis not present

## 2022-07-04 DIAGNOSIS — M9906 Segmental and somatic dysfunction of lower extremity: Secondary | ICD-10-CM | POA: Diagnosis not present

## 2022-07-04 DIAGNOSIS — M5441 Lumbago with sciatica, right side: Secondary | ICD-10-CM | POA: Diagnosis not present

## 2022-07-04 DIAGNOSIS — M9903 Segmental and somatic dysfunction of lumbar region: Secondary | ICD-10-CM | POA: Diagnosis not present

## 2022-07-10 ENCOUNTER — Encounter (INDEPENDENT_AMBULATORY_CARE_PROVIDER_SITE_OTHER): Payer: Self-pay | Admitting: Family Medicine

## 2022-07-10 ENCOUNTER — Ambulatory Visit (INDEPENDENT_AMBULATORY_CARE_PROVIDER_SITE_OTHER): Payer: Medicare HMO | Admitting: Family Medicine

## 2022-07-10 VITALS — BP 113/71 | HR 68 | Temp 98.6°F | Ht 60.0 in | Wt 154.0 lb

## 2022-07-10 DIAGNOSIS — Z6831 Body mass index (BMI) 31.0-31.9, adult: Secondary | ICD-10-CM

## 2022-07-10 DIAGNOSIS — R7303 Prediabetes: Secondary | ICD-10-CM

## 2022-07-10 DIAGNOSIS — E669 Obesity, unspecified: Secondary | ICD-10-CM | POA: Diagnosis not present

## 2022-07-10 DIAGNOSIS — R748 Abnormal levels of other serum enzymes: Secondary | ICD-10-CM | POA: Diagnosis not present

## 2022-07-10 DIAGNOSIS — E559 Vitamin D deficiency, unspecified: Secondary | ICD-10-CM | POA: Diagnosis not present

## 2022-07-10 DIAGNOSIS — R7989 Other specified abnormal findings of blood chemistry: Secondary | ICD-10-CM

## 2022-07-10 DIAGNOSIS — I152 Hypertension secondary to endocrine disorders: Secondary | ICD-10-CM | POA: Diagnosis not present

## 2022-07-10 DIAGNOSIS — E66811 Obesity, class 1: Secondary | ICD-10-CM

## 2022-07-10 NOTE — Progress Notes (Signed)
Sara Sara Obrien, D.O.  ABFM, ABOM Specializing in Clinical Bariatric Medicine  Office located at: [address]  [place], [place]  [id number]     Assessment and Plan:    Prediabetes Assessment: Condition is Not optimized. Lab Results  Component Value Date   HGBA1C 5.9 (H) 06/12/2022   HGBA1C 5.9 (H) 01/19/2022   HGBA1C 5.8 (H) 07/06/2021   INSULIN 11.8 06/12/2022   INSULIN 9.7 06/16/2019  Patient is not currently on any medication for this condition. This is diet controlled. She endorses that she has no hunger and cravings. She states that the food on the meal plan is a Sara Obrien too eat.   Plan: Continue her prudent nutritional plan and continue to advance exercise and cardiovascular fitness as tolerated.    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 Last 3 blood pressure readings in our office are as follows: BP Readings from Last 3 Encounters:  07/10/22 113/71  06/26/22 (!) 145/82  06/12/22 (!) 159/74  Patient endorses that at home her blood pressure has been stable. Her blood pressure is usually in the 121-130/60-70s. She reports good compliance and tolerance with Benicar HCT 40-12.5 mg daily.   Plan: Continue with med as recommended by pcp and or specialist.  -I informed her that as she continues losing weight, her blood pressure may go down.  -Continue with Prudent nutritional plan and low sodium diet, advance exercise as tolerated.   -Continue monitoring her blood pressure at home regularly.    Vitamin D deficiency Assessment: Condition is Not optimized. Lab Results  Component Value Date   VD25OH 28.9 (L) 06/12/2022   VD25OH 53.2 12/31/2019  Patient has been more compliant with Ergocalciferol 50K IU weekly. Denies any side effects.  Plan: Continue with med.  - weight loss will likely improve availability of vitamin D, thus encouraged Sara Sara Obrien to continue with meal plan and their weight loss efforts to further improve this  condition.  Thus, we will need to monitor levels regularly (every 3-4 mo on average) to keep levels within normal limits and prevent over supplementation.   Elevated vitamin B12 level Assessment: Condition is Not optimized. Lab Results  Component Value Date   VITAMINB12 >2000 (H) 06/12/2022  Patient endorses she is taking OTC Vitamin B12 1,000 mcg daily. Denies any side effects.  Plan: Switch OTC Vitamin B12 usage to every 3 days.  Continue her prudent nutritional plan and continue to advance exercise and cardiovascular fitness as tolerated.    TREATMENT PLAN FOR OBESITY: Obesity with current bmi 30.1 (start bmi 30.97/date 06/12/22) Assessment: Condition is Improving, but not optimized.. Biometric data collected today, was reviewed with patient.  Fat mass has decreased by 1.6lb. Muscle mass has decreased by 1lb. Total body water has decreased by  .5lb. Patient endorses she had pain down her left lower extremity after playing pickleball.   Plan: Continue with  Category 1 meal plan with lunch options.  I recommended patient to discuss with her PCP regarding possibly meeting with a physical therapist for functional physical therapy and range of motion evaluation. We discussed different lunch options that she can try at home.   Behavioral Intervention Additional resources provided today: patient declined Evidence-based interventions for health behavior change were utilized today including the discussion of self monitoring techniques, problem-solving barriers and SMART goal setting techniques.   Regarding patient's less desirable eating habits and patterns, we employed the technique of small changes.  Pt will specifically work on: continue with Category 1 meal  plan with lunch options for next visit.    Recommended Physical Activity Goals Sara Sara Obrien has been advised to work up to 150 minutes of moderate intensity aerobic activity a week and strengthening exercises 2-3 times per week for  cardiovascular health, weight loss maintenance and preservation of muscle mass.  She has agreed to Continue current level of physical activity    FOLLOW UP: Return in about 2 weeks (around 07/24/2022). She was informed of the importance of frequent follow up visits to maximize her success with intensive lifestyle modifications for her multiple health conditions.    Subjective:   Chief complaint: Obesity Sara Sara Obrien is here to discuss her progress with her obesity treatment plan. She is on the the Category 1 Plan with lunch options and states she is following her eating plan approximately 85% of the time. She states she is exercising 120 minutes a few times per week.  Interval History:  Sara Sara Obrien is here for a follow up office visit. Patient endorses that she has been following the meal plan mostly. Recently, she states that her energy levels have been low, but her sleep patterns have not changed. She is drinking around 5 (20 oz) bottles of water daily.  We reviewed her meal plan and all questions were answered. Patient's food recall appears to be accurate and consistent with what is on plan when she is following it. When eating on plan, her hunger and cravings are well controlled.     Review of Systems:  Pertinent positives were addressed with patient today.    Weight Summary and Biometrics   Weight Lost Since Last Visit: 2lb  No data recorded   Vitals Temp: 98.6 F (37 C) BP: 113/71 Pulse Rate: 68 SpO2: 99 %   Anthropometric Measurements Height: 5' (1.524 m) Weight: 154 lb (69.9 kg) BMI (Calculated): 30.08 Weight at Last Visit: 156lb Weight Lost Since Last Visit: 2lb Starting Weight: 158.6lb Total Weight Loss (lbs): 4 lb (1.814 kg) Peak Weight: 190lb   Body Composition  Body Fat %: 40 % Fat Mass (lbs): 61.6 lbs Muscle Mass (lbs): 87.6 lbs Total Body Water (lbs): 60.6 lbs Visceral Fat Rating : 12   Other Clinical Data Fasting: no Labs: no Today's Visit #:  3 Starting Date: 06/12/22     Objective:   PHYSICAL EXAM:  Blood pressure 113/71, pulse 68, temperature 98.6 F (37 C), height 5' (1.524 m), weight 154 lb (69.9 kg), SpO2 99 %. Body mass index is 30.08 kg/m.  General: Well Developed, well nourished, and in no acute distress.  HEENT: Normocephalic, atraumatic Skin: Warm and dry, cap RF less 2 sec, good turgor Chest:  Normal excursion, shape, no gross abn Respiratory: speaking in full sentences, no conversational dyspnea NeuroM-Sk: Ambulates w/o assistance, moves * 4 Psych: A and O *3, insight good, mood-full  DIAGNOSTIC DATA REVIEWED:  BMET    Component Value Date/Time   NA 141 06/12/2022 1227   K 4.1 06/12/2022 1227   CL 100 06/12/2022 1227   CO2 23 06/12/2022 1227   GLUCOSE 89 06/12/2022 1227   GLUCOSE 99 11/08/2010 1100   BUN 12 06/12/2022 1227   CREATININE 0.80 06/12/2022 1227   CALCIUM 10.0 06/12/2022 1227   GFRNONAA 63 12/31/2019 1429   GFRAA 73 12/31/2019 1429   Lab Results  Component Value Date   HGBA1C 5.9 (H) 06/12/2022   HGBA1C 5.6 12/17/2018   Lab Results  Component Value Date   INSULIN 11.8 06/12/2022   INSULIN 9.7 06/16/2019  Lab Results  Component Value Date   TSH 0.724 06/12/2022   CBC    Component Value Date/Time   WBC 4.3 06/12/2022 1227   WBC 12.7 (H) 08/15/2008 1804   RBC 4.60 06/12/2022 1227   RBC 4.31 08/15/2008 1804   HGB 13.2 06/12/2022 1227   HCT 40.8 06/12/2022 1227   PLT 372 06/12/2022 1227   MCV 89 06/12/2022 1227   MCH 28.7 06/12/2022 1227   MCHC 32.4 06/12/2022 1227   MCHC 34.4 08/15/2008 1804   RDW 14.0 06/12/2022 1227   Iron Studies No results found for: "IRON", "TIBC", "FERRITIN", "IRONPCTSAT" Lipid Panel     Component Value Date/Time   CHOL 262 (H) 06/12/2022 1227   TRIG 93 06/12/2022 1227   HDL 61 06/12/2022 1227   CHOLHDL 3.6 01/19/2022 1540   LDLCALC 185 (H) 06/12/2022 1227   Hepatic Function Panel     Component Value Date/Time   PROT 7.6  06/12/2022 1227   ALBUMIN 4.2 06/12/2022 1227   AST 20 06/12/2022 1227   ALT 13 06/12/2022 1227   ALKPHOS 85 06/12/2022 1227   BILITOT 0.3 06/12/2022 1227      Component Value Date/Time   TSH 0.724 06/12/2022 1227   Nutritional Lab Results  Component Value Date   VD25OH 28.9 (L) 06/12/2022   VD25OH 53.2 12/31/2019    Attestations:   Reviewed by clinician on day of visit: allergies, medications, problem list, medical history, surgical history, family history, social history, and previous encounter notes.  I,Special Puri,acting as a Neurosurgeonscribe for [employer] McL[employer]anDeborah Prue Lingenfelter, DO.,have documented all relevant documentation on the behalf of Sara LotDeborah Kalon Erhardt, DO,as directed by  Sara Loteborah Kemberly Taves, DO while in the presence of Sara Loteborah Brittiany Wiehe, DO.   I, Sara Loteborah Leianna Barga, DO, have reviewed all documentation for this visit. The documentation on 07/10/22 for the exam, diagnosis, procedures, and orders are all accurate and complete.

## 2022-07-11 DIAGNOSIS — M9901 Segmental and somatic dysfunction of cervical region: Secondary | ICD-10-CM | POA: Diagnosis not present

## 2022-07-11 DIAGNOSIS — M9906 Segmental and somatic dysfunction of lower extremity: Secondary | ICD-10-CM | POA: Diagnosis not present

## 2022-07-11 DIAGNOSIS — M9903 Segmental and somatic dysfunction of lumbar region: Secondary | ICD-10-CM | POA: Diagnosis not present

## 2022-07-11 DIAGNOSIS — M5441 Lumbago with sciatica, right side: Secondary | ICD-10-CM | POA: Diagnosis not present

## 2022-07-18 DIAGNOSIS — M9901 Segmental and somatic dysfunction of cervical region: Secondary | ICD-10-CM | POA: Diagnosis not present

## 2022-07-18 DIAGNOSIS — M5441 Lumbago with sciatica, right side: Secondary | ICD-10-CM | POA: Diagnosis not present

## 2022-07-18 DIAGNOSIS — M9906 Segmental and somatic dysfunction of lower extremity: Secondary | ICD-10-CM | POA: Diagnosis not present

## 2022-07-18 DIAGNOSIS — M9903 Segmental and somatic dysfunction of lumbar region: Secondary | ICD-10-CM | POA: Diagnosis not present

## 2022-07-23 ENCOUNTER — Ambulatory Visit (INDEPENDENT_AMBULATORY_CARE_PROVIDER_SITE_OTHER): Payer: Medicare HMO | Admitting: Internal Medicine

## 2022-07-23 ENCOUNTER — Encounter: Payer: Self-pay | Admitting: Internal Medicine

## 2022-07-23 VITALS — BP 136/70 | HR 68 | Temp 98.4°F | Ht 60.0 in | Wt 154.8 lb

## 2022-07-23 DIAGNOSIS — F419 Anxiety disorder, unspecified: Secondary | ICD-10-CM | POA: Diagnosis not present

## 2022-07-23 DIAGNOSIS — R202 Paresthesia of skin: Secondary | ICD-10-CM

## 2022-07-23 DIAGNOSIS — N182 Chronic kidney disease, stage 2 (mild): Secondary | ICD-10-CM

## 2022-07-23 DIAGNOSIS — F3341 Major depressive disorder, recurrent, in partial remission: Secondary | ICD-10-CM | POA: Diagnosis not present

## 2022-07-23 DIAGNOSIS — Z6830 Body mass index (BMI) 30.0-30.9, adult: Secondary | ICD-10-CM

## 2022-07-23 DIAGNOSIS — I129 Hypertensive chronic kidney disease with stage 1 through stage 4 chronic kidney disease, or unspecified chronic kidney disease: Secondary | ICD-10-CM

## 2022-07-23 DIAGNOSIS — M79609 Pain in unspecified limb: Secondary | ICD-10-CM | POA: Diagnosis not present

## 2022-07-23 DIAGNOSIS — R7309 Other abnormal glucose: Secondary | ICD-10-CM

## 2022-07-23 DIAGNOSIS — F331 Major depressive disorder, recurrent, moderate: Secondary | ICD-10-CM | POA: Insufficient documentation

## 2022-07-23 DIAGNOSIS — E7800 Pure hypercholesterolemia, unspecified: Secondary | ICD-10-CM | POA: Diagnosis not present

## 2022-07-23 DIAGNOSIS — E6609 Other obesity due to excess calories: Secondary | ICD-10-CM

## 2022-07-23 LAB — BMP8+EGFR
BUN/Creatinine Ratio: 15 (ref 12–28)
BUN: 16 mg/dL (ref 8–27)
CO2: 24 mmol/L (ref 20–29)
Calcium: 10.6 mg/dL — ABNORMAL HIGH (ref 8.7–10.3)
Chloride: 102 mmol/L (ref 96–106)
Creatinine, Ser: 1.09 mg/dL — ABNORMAL HIGH (ref 0.57–1.00)
Glucose: 95 mg/dL (ref 70–99)
Potassium: 4.7 mmol/L (ref 3.5–5.2)
Sodium: 143 mmol/L (ref 134–144)
eGFR: 53 mL/min/{1.73_m2} — ABNORMAL LOW (ref >59)

## 2022-07-23 NOTE — Progress Notes (Signed)
Hershal Coria Martin,acting as a Neurosurgeon for Gwynneth Aliment, MD.,have documented all relevant documentation on the behalf of Gwynneth Aliment, MD,as directed by  Gwynneth Aliment, MD while in the presence of Gwynneth Aliment, MD.    Subjective:     Patient ID: Sara Obrien , female    DOB: [birth year] , 77 y.o.   MRN: [id number]   Chief Complaint  Patient presents with   Hypertension   Hyperlipidemia    HPI  She is here today for blood pressure & chol f/u.  She reports compliance with meds. She denies headaches, chest pain and shortness of breath.  She states she is tired today because she played in a pickleball tournament this weekend. She was up all night because her stomach has been upset.   BP Readings from Last 3 Encounters: 07/23/22 : (!) 140/70 07/10/22 : 113/71 06/26/22 : (!) 145/82    Hypertension This is a chronic problem. The current episode started more than 1 year ago. The problem has been gradually improving since onset. The problem is controlled. Pertinent negatives include no blurred vision, chest pain, palpitations or shortness of breath. Risk factors for coronary artery disease include dyslipidemia, obesity, post-menopausal state and sedentary lifestyle. The current treatment provides moderate improvement. Hypertensive end-organ damage includes kidney disease. Identifiable causes of hypertension include chronic renal disease.  Hyperlipidemia This is a chronic problem. The current episode started more than 1 year ago. The problem is controlled. Recent lipid tests were reviewed and are high. Exacerbating diseases include chronic renal disease and obesity. Pertinent negatives include no chest pain or shortness of breath.     Past Medical History:  Diagnosis Date   Anxiety    Atypical chest pain 07/01/2017   Constipation    Depression    Essential hypertension 07/01/2017   Gum disease    dentures worn   History of swelling of feet    Hyperlipidemia    Hypertension     Knee pain    Low back pain    Migraines    Overweight      Family History  Problem Relation Age of Onset   Brain cancer Mother    Hypertension Mother    Diabetes Mother    High Cholesterol Mother    Heart disease Mother    Stroke Mother    Depression Mother    Stroke Father    Hypertension Father    Heart disease Father    Sudden death Father    Hypertension Sister    Breast cancer Sister    Cancer Brother    Cancer Brother    Cancer Brother      Current Outpatient Medications:    atorvastatin (LIPITOR) 20 MG tablet, TAKE 1 TABLET EVERY DAY, Disp: 90 tablet, Rfl: 2   COVID-19 mRNA vaccine 2023-2024 (COMIRNATY) syringe, Inject 0.3 mLs into the muscle., Disp: 0.3 mL, Rfl: 0   diphenhydramine-acetaminophen (TYLENOL PM) 25-500 MG TABS tablet, Take 1 tablet by mouth at bedtime as needed., Disp: , Rfl:    escitalopram (LEXAPRO) 20 MG tablet, TAKE 1 TABLET EVERY DAY, Disp: 90 tablet, Rfl: 2   olmesartan-hydrochlorothiazide (BENICAR HCT) 40-12.5 MG tablet, TAKE 1 TABLET EVERY DAY, Disp: 90 tablet, Rfl: 2   Omega-3 Fatty Acids (OMEGA 3 500 PO), Take by mouth., Disp: , Rfl:    Vitamin D, Ergocalciferol, (DRISDOL) 1.25 MG (50000 UNIT) CAPS capsule, Take 1 capsule (50,000 Units total) by mouth every 7 (seven) days., Disp: 4 capsule, Rfl:  0   No Known Allergies   Review of Systems  Constitutional: Negative.   Eyes:  Negative for blurred vision.  Respiratory: Negative.  Negative for shortness of breath.   Cardiovascular: Negative.  Negative for chest pain and palpitations.  Musculoskeletal: Negative.   Skin: Negative.   Neurological:  Positive for numbness.       She states she awakened w/ left hand burning back in March. She does not think she slept on it. She denies having any LUE weakness. She has not had any recurrences.   Psychiatric/Behavioral: Negative.       Today's Vitals   07/23/22 0916 07/23/22 0940  BP: (!) 140/70 136/70  Pulse: 68   Temp: 98.4 F (36.9 C)    TempSrc: Oral   Weight: 154 lb 12.8 oz (70.2 kg)   Height: 5' (1.524 m)   PainSc: 0-No pain    Body mass index is 30.23 kg/m.  Wt Readings from Last 3 Encounters:  07/23/22 154 lb 12.8 oz (70.2 kg)  07/10/22 154 lb (69.9 kg)  06/26/22 156 lb 9.6 oz (71 kg)    BP Readings from Last 3 Encounters:  07/23/22 136/70  07/10/22 113/71  06/26/22 (!) 145/82     Objective:  Physical Exam Vitals and nursing note reviewed.  Constitutional:      Appearance: Normal appearance.  HENT:     Head: Normocephalic and atraumatic.  Eyes:     Extraocular Movements: Extraocular movements intact.  Cardiovascular:     Rate and Rhythm: Normal rate and regular rhythm.     Heart sounds: Normal heart sounds.  Pulmonary:     Effort: Pulmonary effort is normal.     Breath sounds: Normal breath sounds.  Musculoskeletal:     Cervical back: Normal range of motion.  Skin:    General: Skin is warm.  Neurological:     General: No focal deficit present.     Mental Status: She is alert.     Deep Tendon Reflexes: Reflexes normal.  Psychiatric:        Mood and Affect: Mood normal.        Behavior: Behavior normal.      Assessment And Plan:     1. Hypertensive nephropathy Comments: Chronic, fair control. Goal BP<120/80.  No med changes today, admits to not sleeping well last night. She will c/w olmesartan/hct 40/12.5mg  daily. F/u 3-4 mos.  2. Chronic renal disease, stage II Comments: Chronic, encouraged to stay well hydrated and keep BP well controlled to decrease risk of CKD progression. Will check K+ level - BMP8+EGFR  3. Pure hypercholesterolemia Comments: Chronic, Labs reviewed from March 2024. Admits noncompliance w/ atorvastatin. She agrees to coronary calcium scoring. - CT CARDIAC SCORING (SELF PAY ONLY); Future  4. Paresthesia and pain of left extremity Comments: She will let me know if her sx recur. I will order NCS if needed.  5. Recurrent major depressive disorder, in partial  remission Comments: Her sx are stable, c/w Lexapro  daily.  6. Other abnormal glucose Comments: Previous labs reviewed, a1c has been elevated in the past. I will recheck this today.  7. Class 1 obesity due to excess calories with serious comorbidity and body mass index (BMI) of 30.0 to 30.9 in adult Comments: Her BMI is acceptable for her demographic. She is encouraged to aim for at least 150 minutes of exercise/week.   Patient was given opportunity to ask questions. Patient verbalized understanding of the plan and was able to repeat key elements  of the plan. All questions were answered to their satisfaction.   I, Gwynneth Aliment, MD, have reviewed all documentation for this visit. The documentation on 07/23/22 for the exam, diagnosis, procedures, and orders are all accurate and complete.   IF YOU HAVE BEEN REFERRED TO A SPECIALIST, IT MAY TAKE 1-2 WEEKS TO SCHEDULE/PROCESS THE REFERRAL. IF YOU HAVE NOT HEARD FROM US/SPECIALIST IN TWO WEEKS, PLEASE GIVE [place] A CALL AT [phone] X [phone].   THE PATIENT IS ENCOURAGED TO PRACTICE SOCIAL DISTANCING DUE TO THE COVID-19 PANDEMIC.

## 2022-07-23 NOTE — Patient Instructions (Addendum)
The 10-year ASCVD risk score (Arnett DK, et al., 2019) is: 22.7%   Values used to calculate the score:     Age: 77 years     Sex: Female     Is Non-Hispanic African American: Yes     Diabetic: No     Tobacco smoker: No     Systolic Blood Pressure: 136 mmHg     Is BP treated: Yes     HDL Cholesterol: 61 mg/dL     Total Cholesterol: 262 mg/dL  Coronary Calcium Scan A coronary calcium scan is an imaging test used to look for deposits of plaque in the inner lining of the blood vessels of the heart (coronary arteries). Plaque is made up of calcium, protein, and fatty substances. These deposits of plaque can partly clog and narrow the coronary arteries without producing any symptoms or warning signs. This puts a person at risk for a heart attack. A coronary calcium scan is performed using a computed tomography (CT) scanner machine without using a dye (contrast). This test is recommended for people who are at moderate risk for heart disease. The test can find plaque deposits before symptoms develop. Tell a health care provider about: Any allergies you have. All medicines you are taking, including vitamins, herbs, eye drops, creams, and over-the-counter medicines. Any problems you or family members have had with anesthetic medicines. Any bleeding problems you have. Any surgeries you have had. Any medical conditions you have. Whether you are pregnant or may be pregnant. What are the risks? Generally, this is a safe procedure. However, problems may occur, including: Harm to a pregnant woman and her unborn baby. This test involves the use of radiation. Radiation exposure can be dangerous to a pregnant woman and her unborn baby. If you are pregnant or think you may be pregnant, you should not have this procedure done. A slight increase in the risk of cancer. This is because of the radiation involved in the test. The amount of radiation from one test is similar to the amount of radiation you are  naturally exposed to over one year. What happens before the procedure? Ask your health care provider for any specific instructions on how to prepare for this procedure. You may be asked to avoid products that contain caffeine, tobacco, or nicotine for 4 hours before the procedure. What happens during the procedure?  You will undress and remove any jewelry from your neck or chest. You may need to remove hearing aides and dentures. Women may need to remove their bras. You will put on a hospital gown. Sticky electrodes will be placed on your chest. The electrodes will be connected to an electrocardiogram (ECG) machine to record a tracing of the electrical activity of your heart. You will lie down on your back on a curved bed that is attached to the CT scanner. You may be given medicine to slow down your heart rate so that clear pictures can be created. You will be moved into the CT scanner, and the CT scanner will take pictures of your heart. During this time, you will be asked to lie still and hold your breath for 10-20 seconds at a time while each picture of your heart is being taken. The procedure may vary among health care providers and hospitals. What can I expect after the procedure? You can return to your normal activities. It is up to you to get the results of your procedure. Ask your health care provider, or the department that is doing  the procedure, when your results will be ready. Summary A coronary calcium scan is an imaging test used to look for deposits of plaque in the inner lining of the blood vessels of the heart. Plaque is made up of calcium, protein, and fatty substances. A coronary calcium scan is performed using a CT scanner machine without contrast. Generally, this is a safe procedure. Tell your health care provider if you are pregnant or may be pregnant. Ask your health care provider for any specific instructions on how to prepare for this procedure. You can return to your  normal activities after the scan is done. This information is not intended to replace advice given to you by your health care provider. Make sure you discuss any questions you have with your health care provider. Document Revised: 02/26/2021 Document Reviewed: 02/26/2021 Elsevier Patient Education  [place].  Hypertension, Adult Hypertension is another name for high blood pressure. High blood pressure forces your heart to work harder to pump blood. This can cause problems over time. There are two numbers in a blood pressure reading. There is a top number (systolic) over a bottom number (diastolic). It is best to have a blood pressure that is below 120/80. What are the causes? The cause of this condition is not known. Some other conditions can lead to high blood pressure. What increases the risk? Some lifestyle factors can make you more likely to develop high blood pressure: Smoking. Not getting enough exercise or physical activity. Being overweight. Having too much fat, sugar, calories, or salt (sodium) in your diet. Drinking too much alcohol. Other risk factors include: Having any of these conditions: Heart disease. Diabetes. High cholesterol. Kidney disease. Obstructive sleep apnea. Having a family history of high blood pressure and high cholesterol. Age. The risk increases with age. Stress. What are the signs or symptoms? High blood pressure may not cause symptoms. Very high blood pressure (hypertensive crisis) may cause: Headache. Fast or uneven heartbeats (palpitations). Shortness of breath. Nosebleed. Vomiting or feeling like you may vomit (nauseous). Changes in how you see. Very bad chest pain. Feeling dizzy. Seizures. How is this treated? This condition is treated by making healthy lifestyle changes, such as: Eating healthy foods. Exercising more. Drinking less alcohol. Your doctor may prescribe medicine if lifestyle changes do not help enough and  if: Your top number is above 130. Your bottom number is above 80. Your personal target blood pressure may vary. Follow these instructions at home: Eating and drinking  If told, follow the DASH eating plan. To follow this plan: Fill one half of your plate at each meal with fruits and vegetables. Fill one fourth of your plate at each meal with whole grains. Whole grains include whole-wheat pasta, brown rice, and whole-grain bread. Eat or drink low-fat dairy products, such as skim milk or low-fat yogurt. Fill one fourth of your plate at each meal with low-fat (lean) proteins. Low-fat proteins include fish, chicken without skin, eggs, beans, and tofu. Avoid fatty meat, cured and processed meat, or chicken with skin. Avoid pre-made or processed food. Limit the amount of salt in your diet to less than 1,500 mg each day. Do not drink alcohol if: Your doctor tells you not to drink. You are pregnant, may be pregnant, or are planning to become pregnant. If you drink alcohol: Limit how much you have to: 0-1 drink a day for women. 0-2 drinks a day for men. Know how much alcohol is in your drink. In the [place] one drink  equals one 12 oz bottle of beer (355 mL), one 5 oz glass of wine (148 mL), or one 1 oz glass of hard liquor (44 mL). Lifestyle  Work with your doctor to stay at a healthy weight or to lose weight. Ask your doctor what the best weight is for you. Get at least 30 minutes of exercise that causes your heart to beat faster (aerobic exercise) most days of the week. This may include walking, swimming, or biking. Get at least 30 minutes of exercise that strengthens your muscles (resistance exercise) at least 3 days a week. This may include lifting weights or doing Pilates. Do not smoke or use any products that contain nicotine or tobacco. If you need help quitting, ask your doctor. Check your blood pressure at home as told by your doctor. Keep all follow-up visits. Medicines Take  over-the-counter and prescription medicines only as told by your doctor. Follow directions carefully. Do not skip doses of blood pressure medicine. The medicine does not work as well if you skip doses. Skipping doses also puts you at risk for problems. Ask your doctor about side effects or reactions to medicines that you should watch for. Contact a doctor if: You think you are having a reaction to the medicine you are taking. You have headaches that keep coming back. You feel dizzy. You have swelling in your ankles. You have trouble with your vision. Get help right away if: You get a very bad headache. You start to feel mixed up (confused). You feel weak or numb. You feel faint. You have very bad pain in your: Chest. Belly (abdomen). You vomit more than once. You have trouble breathing. These symptoms may be an emergency. Get help right away. Call 911. Do not wait to see if the symptoms will go away. Do not drive yourself to the hospital. Summary Hypertension is another name for high blood pressure. High blood pressure forces your heart to work harder to pump blood. For most people, a normal blood pressure is less than 120/80. Making healthy choices can help lower blood pressure. If your blood pressure does not get lower with healthy choices, you may need to take medicine. This information is not intended to replace advice given to you by your health care provider. Make sure you discuss any questions you have with your health care provider. Document Revised: 01/05/2021 Document Reviewed: 01/05/2021 Elsevier Patient Education  [place].

## 2022-07-24 ENCOUNTER — Ambulatory Visit (INDEPENDENT_AMBULATORY_CARE_PROVIDER_SITE_OTHER): Payer: Medicare HMO | Admitting: Family Medicine

## 2022-07-24 ENCOUNTER — Encounter (INDEPENDENT_AMBULATORY_CARE_PROVIDER_SITE_OTHER): Payer: Self-pay | Admitting: Family Medicine

## 2022-07-24 VITALS — BP 116/73 | HR 67 | Temp 98.4°F | Ht 60.0 in | Wt 150.0 lb

## 2022-07-24 DIAGNOSIS — E559 Vitamin D deficiency, unspecified: Secondary | ICD-10-CM | POA: Diagnosis not present

## 2022-07-24 DIAGNOSIS — Z6830 Body mass index (BMI) 30.0-30.9, adult: Secondary | ICD-10-CM | POA: Diagnosis not present

## 2022-07-24 DIAGNOSIS — R7303 Prediabetes: Secondary | ICD-10-CM

## 2022-07-24 DIAGNOSIS — E669 Obesity, unspecified: Secondary | ICD-10-CM

## 2022-07-24 DIAGNOSIS — I152 Hypertension secondary to endocrine disorders: Secondary | ICD-10-CM | POA: Diagnosis not present

## 2022-07-24 MED ORDER — VITAMIN D (ERGOCALCIFEROL) 1.25 MG (50000 UNIT) PO CAPS
50000.0000 [IU] | ORAL_CAPSULE | ORAL | 0 refills | Status: DC
Start: 2022-07-24 — End: 2022-08-23

## 2022-07-24 NOTE — Progress Notes (Signed)
[name], D.O.  ABFM, ABOM Specializing in Clinical Bariatric Medicine  Office located at: [address] [place], [place]  [address]     Assessment and Plan:   No orders of the defined types were placed in this encounter.   Medications Discontinued During This Encounter  Medication Reason   COVID-19 mRNA vaccine 2023-2024 (COMIRNATY) syringe Completed Course   Vitamin D, Ergocalciferol, (DRISDOL) 1.25 MG (50000 UNIT) CAPS capsule Reorder     Meds ordered this encounter  Medications   Vitamin D, Ergocalciferol, (DRISDOL) 1.25 MG (50000 UNIT) CAPS capsule    Sig: Take 1 capsule (50,000 Units total) by mouth every 7 (seven) days.    Dispense:  4 capsule    Refill:  0    Prediabetes Assessment: Condition is Not optimized. Lab Results  Component Value Date   HGBA1C 5.9 (H) 06/12/2022   HGBA1C 5.9 (H) 01/19/2022   HGBA1C 5.8 (H) 07/06/2021   INSULIN 11.8 06/12/2022   INSULIN 9.7 06/16/2019  She is not on any prediabetic medication. This condition is diet controlled. When eating on the meal plan, patient's hunger and cravings are controlled.   Plan: - Continue to decrease simple carbs/ sugars; increase fiber and proteins -> follow her meal plan.   [name] will continue to work on weight loss, exercise, via their meal plan we devised to help decrease the risk of progressing to diabetes.  - We will recheck A1c and fasting insulin level in approximately 3 months from last check, or as deemed appropriate.    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 Last 3 blood pressure readings in our office are as follows: BP Readings from Last 3 Encounters:  07/24/22 116/73  07/23/22 136/70  07/10/22 113/71  Her blood pressure today and on the last office visit has been within the normal range. No concerns. She reports good compliance and tolerance with Benicar HCT 40-12.5 mg daily.   Plan: Continue with meds as prescribed by Dr.Sanders.   -Continue with Prudent nutritional plan and low sodium diet, advance exercise as tolerated.     Vitamin D deficiency Assessment: Condition is Not optimized. Lab Results  Component Value Date   VD25OH 28.9 (L) 06/12/2022   VD25OH 53.2 12/31/2019  No issues with Ergocalciferol 50K IU weekly daily, compliance good. Denies any side effects.  Plan:Continue with Ergocalciferol 50K IU weekly. Will refill this today.   - weight loss will likely improve availability of vitamin D, thus encouraged [name] to continue with meal plan and their weight loss efforts to further improve this condition.  T  TREATMENT PLAN FOR OBESITY: Obesity with current bmi 30.1 (start bmi 30.97/date 06/12/22) Assessment: Condition is improving, but not optimized. Biometric data collected today, was reviewed with patient.  Fat mass has decreased by 4lb. Muscle mass has increased by 0.2lb. Total body water has decreased by 2.4lb.   Plan:  [name] is currently in the action stage of change. As such, her goal is to continue weight management plan. [name] will work on healthier eating habits and continue with the Category 1 meal plan with lunch options.   Behavioral Intervention Additional resources provided today: Resistance band Evidence-based interventions for health behavior change were utilized today including the discussion of self monitoring techniques, problem-solving barriers and SMART goal setting techniques.   Regarding patient's less desirable eating habits and patterns, we employed the technique of small changes.  Pt will specifically work on: 2 sets of 10-15 reps, 2 days a  week of resistance band exercises  and not skipping any meals for next visit.    Recommended Physical Activity Goals Ananda has been advised to work up to 150 minutes of moderate intensity aerobic activity a week and strengthening exercises 2-3 times per week for cardiovascular health, weight loss maintenance and preservation of  muscle mass.  She has agreed to Think about ways to increase physical activity  FOLLOW UP: Return in about 4 weeks (around 08/21/2022). She was informed of the importance of frequent follow up visits to maximize her success with intensive lifestyle modifications for her multiple health conditions.  Subjective:   Chief complaint: Obesity Sara Obrien is here to discuss her progress with her obesity treatment plan. She is on the the Category 1 Plan with lunch options and states she is following her eating plan approximately 60% of the time. She states she is playing pickelball 20 minutes 3 days per week.  Interval History:  Sara Obrien is here for a follow up office visit. Since last office visit she is still having difficulty with eating all the food on the meal, but is working on increasing her intake. While she ate out, during a Volunteering event, she endorses making healthier choices. She has been eating egg muffins for breakfast, which is a recipe I recommended to her on her last visit. She has been drinking coconut water for hydration. Patient also endorses that she is using her Vitamin B12 every 3 days. When eating on plan, her hunger and cravings are well controlled.     Review of Systems:  Pertinent positives were addressed with patient today.   Weight Summary and Biometrics   Weight Lost Since Last Visit: 4 lb  No data recorded   Vitals Temp: 98.4 F (36.9 C) BP: 116/73 Pulse Rate: 67 SpO2: 100 %   Anthropometric Measurements Height: 5' (1.524 m) Weight: 150 lb (68 kg) BMI (Calculated): 29.3 Weight at Last Visit: 154 lb Weight Lost Since Last Visit: 4 lb Starting Weight: 158 lb Total Weight Loss (lbs): 8 lb (3.629 kg) Peak Weight: 190 lb   Body Composition  Body Fat %: 38.3 % Fat Mass (lbs): 57.6 lbs Muscle Mass (lbs): 87.8 lbs Total Body Water (lbs): 58.2 lbs Visceral Fat Rating : 11   Other Clinical Data Fasting: yes Labs: no Today's Visit #:  4 Starting Date: 06/12/22    Objective:   PHYSICAL EXAM: Blood pressure 116/73, pulse 67, temperature 98.4 F (36.9 C), height 5' (1.524 m), weight 150 lb (68 kg), SpO2 100 %. Body mass index is 29.29 kg/m.  General: Well Developed, well nourished, and in no acute distress.  HEENT: Normocephalic, atraumatic Skin: Warm and dry, cap RF less 2 sec, good turgor Chest:  Normal excursion, shape, no gross abn Respiratory: speaking in full sentences, no conversational dyspnea NeuroM-Sk: Ambulates w/o assistance, moves * 4 Psych: A and O *3, insight good, mood-full  DIAGNOSTIC DATA REVIEWED:  BMET    Component Value Date/Time   NA 143 07/23/2022 1015   K 4.7 07/23/2022 1015   CL 102 07/23/2022 1015   CO2 24 07/23/2022 1015   GLUCOSE 95 07/23/2022 1015   GLUCOSE 99 11/08/2010 1100   BUN 16 07/23/2022 1015   CREATININE 1.09 (H) 07/23/2022 1015   CALCIUM 10.6 (H) 07/23/2022 1015   GFRNONAA 63 12/31/2019 1429   GFRAA 73 12/31/2019 1429   Lab Results  Component Value Date   HGBA1C 5.9 (H) 06/12/2022   HGBA1C 5.6 12/17/2018   Lab  Results  Component Value Date   INSULIN 11.8 06/12/2022   INSULIN 9.7 06/16/2019   Lab Results  Component Value Date   TSH 0.724 06/12/2022   CBC    Component Value Date/Time   WBC 4.3 06/12/2022 1227   WBC 12.7 (H) 08/15/2008 1804   RBC 4.60 06/12/2022 1227   RBC 4.31 08/15/2008 1804   HGB 13.2 06/12/2022 1227   HCT 40.8 06/12/2022 1227   PLT 372 06/12/2022 1227   MCV 89 06/12/2022 1227   MCH 28.7 06/12/2022 1227   MCHC 32.4 06/12/2022 1227   MCHC 34.4 08/15/2008 1804   RDW 14.0 06/12/2022 1227   Iron Studies No results found for: "IRON", "TIBC", "FERRITIN", "IRONPCTSAT" Lipid Panel     Component Value Date/Time   CHOL 262 (H) 06/12/2022 1227   TRIG 93 06/12/2022 1227   HDL 61 06/12/2022 1227   CHOLHDL 3.6 01/19/2022 1540   LDLCALC 185 (H) 06/12/2022 1227   Hepatic Function Panel     Component Value Date/Time   PROT 7.6  06/12/2022 1227   ALBUMIN 4.2 06/12/2022 1227   AST 20 06/12/2022 1227   ALT 13 06/12/2022 1227   ALKPHOS 85 06/12/2022 1227   BILITOT 0.3 06/12/2022 1227      Component Value Date/Time   TSH 0.724 06/12/2022 1227   Nutritional Lab Results  Component Value Date   VD25OH 28.9 (L) 06/12/2022   VD25OH 53.2 12/31/2019    Attestations:   Reviewed by clinician on day of visit: allergies, medications, problem list, medical history, surgical history, family history, social history, and previous encounter notes.   I,Special Puri,acting as a Neurosurgeon for [employer], DO.,have documented all relevant documentation on the behalf of Thomasene Lot, DO,as directed by  Thomasene Lot, DO while in the presence of Thomasene Lot, DO.   I, Thomasene Lot, DO, have reviewed all documentation for this visit. The documentation on 07/24/22 for the exam, diagnosis, procedures, and orders are all accurate and complete.

## 2022-07-25 ENCOUNTER — Other Ambulatory Visit: Payer: Self-pay | Admitting: Internal Medicine

## 2022-07-25 DIAGNOSIS — Z0000 Encounter for general adult medical examination without abnormal findings: Secondary | ICD-10-CM

## 2022-08-01 DIAGNOSIS — M5441 Lumbago with sciatica, right side: Secondary | ICD-10-CM | POA: Diagnosis not present

## 2022-08-01 DIAGNOSIS — M9901 Segmental and somatic dysfunction of cervical region: Secondary | ICD-10-CM | POA: Diagnosis not present

## 2022-08-01 DIAGNOSIS — M9903 Segmental and somatic dysfunction of lumbar region: Secondary | ICD-10-CM | POA: Diagnosis not present

## 2022-08-01 DIAGNOSIS — M9906 Segmental and somatic dysfunction of lower extremity: Secondary | ICD-10-CM | POA: Diagnosis not present

## 2022-08-08 DIAGNOSIS — M5441 Lumbago with sciatica, right side: Secondary | ICD-10-CM | POA: Diagnosis not present

## 2022-08-08 DIAGNOSIS — M9906 Segmental and somatic dysfunction of lower extremity: Secondary | ICD-10-CM | POA: Diagnosis not present

## 2022-08-08 DIAGNOSIS — M9901 Segmental and somatic dysfunction of cervical region: Secondary | ICD-10-CM | POA: Diagnosis not present

## 2022-08-08 DIAGNOSIS — M9903 Segmental and somatic dysfunction of lumbar region: Secondary | ICD-10-CM | POA: Diagnosis not present

## 2022-08-09 ENCOUNTER — Encounter (INDEPENDENT_AMBULATORY_CARE_PROVIDER_SITE_OTHER): Payer: Self-pay | Admitting: Family Medicine

## 2022-08-09 ENCOUNTER — Ambulatory Visit (INDEPENDENT_AMBULATORY_CARE_PROVIDER_SITE_OTHER): Payer: Medicare HMO | Admitting: Family Medicine

## 2022-08-09 VITALS — BP 109/69 | HR 56 | Temp 98.2°F | Ht 60.0 in | Wt 152.0 lb

## 2022-08-09 DIAGNOSIS — E669 Obesity, unspecified: Secondary | ICD-10-CM | POA: Diagnosis not present

## 2022-08-09 DIAGNOSIS — I10 Essential (primary) hypertension: Secondary | ICD-10-CM | POA: Diagnosis not present

## 2022-08-09 DIAGNOSIS — R7303 Prediabetes: Secondary | ICD-10-CM

## 2022-08-09 DIAGNOSIS — Z6829 Body mass index (BMI) 29.0-29.9, adult: Secondary | ICD-10-CM

## 2022-08-09 DIAGNOSIS — E559 Vitamin D deficiency, unspecified: Secondary | ICD-10-CM

## 2022-08-09 NOTE — Progress Notes (Signed)
[name], D.O.  ABFM, ABOM Specializing in Clinical Bariatric Medicine  Office located at: [address] [place], [place]  [address]     Assessment and Plan:   Essential Hypertension Assessment: Condition is low normal.  Last 3 blood pressure readings in our office are as follows: BP Readings from Last 3 Encounters:  08/09/22 109/69  07/24/22 116/73  07/23/22 136/70  - No issues with Benicar- HCT 40-12.5 mg daily. Denies any side effects. - Today is asymptomatic, no concerns. Denies feeling dizzy and light headed.  - She checks her blood pressure at home twice a week.   Plan: - Continue with med as recommended by PCP.  - Avoid buying foods that are: processed, frozen, or prepackaged to avoid excess salt. - Continual ambulatory blood pressure monitoring encouraged.  Reminded patient that if they ever feel poorly in any way, to check their blood pressure and pulse as well.  - We will continue to monitor closely alongside PCP/ specialists.  Pt reminded to also f/up with those individuals as instructed by them.  - We will continue to monitor symptoms as they relate to the her weight loss journey.    Prediabetes Assessment: Condition is not optimized. Lab Results  Component Value Date   HGBA1C 5.9 (H) 06/12/2022   HGBA1C 5.9 (H) 01/19/2022   HGBA1C 5.8 (H) 07/06/2021   INSULIN 11.8 06/12/2022   INSULIN 9.7 06/16/2019  - Not on any prediabetic medication. Condition is diet controlled. - When eating on plan, her hunger and cravings are controlled.   Plan: - Continue her prudent nutritional plan that is low in simple carbohydrates, saturated fats and trans fats to goal of 5-10% weight loss to achieve significant health benefits.   Vitamin D deficiency Assessment: Condition is not optimized.  Lab Results  Component Value Date   VD25OH 28.9 (L) 06/12/2022   VD25OH 53.2 12/31/2019  - Reports good compliance and tolerance with Ergocalciferol 50K IU weekly.  Denies any side effects.   Plan: - Continue with med as prescribed. Denies need for refill - Continue her prudent nutritional plan that is low in simple carbohydrates, saturated fats and trans fats to goal of 5-10% weight loss to achieve significant health benefits.    TREATMENT PLAN FOR OBESITY: Obesity with current bmi 29.69 (start bmi 30.97/date 06/12/22) Assessment:  KATONYA SCOBLE is here to discuss her progress with her obesity treatment plan along with follow-up of her obesity related diagnoses. See Medical Weight Management Flowsheet for complete bioelectrical impedance results.  Condition is not optimized. Biometric data collected today, was reviewed with patient.   Since last office visit on 07/24/22 patient's Fat mass has increased by 2.6 lb. Muscle mass has decreased by 0.4 lb. Total body water has increased by 3.2 lb.  Counseling done on how various foods will affect these numbers and how to maximize success   Today's visit was #: 5 Starting weight: 158 lbs  Starting date: 06/12/22 Weight change since last visit: +2 lbs Total lbs lost to date: 6 lbs Total weight loss percentage to date: 3.8   Plan:  Tyasha is currently in the action stage of change. As such, her goal is to continue her weight management plan. Sheyann will work on healthier eating habits and continue with the Category 1 meal plan with lunch options.  - I suggested healthier dessert options that patient can consider having, such as Clio bars, [employer], or 100 calorie Food [employer] cream Sandwich.  -  I emphasized the importance of finding healthier options of the foods that she loves.  - Briefly reviewed the Category 1 meal plan with the pt and discussed that journaling can help with increased awareness.    Behavioral Intervention Additional resources provided today: category 1 meal plan information Evidence-based interventions for health behavior change were utilized today including the discussion of  self monitoring techniques, problem-solving barriers and SMART goal setting techniques.   Regarding patient's less desirable eating habits and patterns, we employed the technique of small changes.  Pt will specifically work on: continue with meal plan and current exercise regiment for next visit.    Recommended Physical Activity Goals Rustie has been advised to slowly work up to 150 minutes of moderate intensity aerobic activity a week and strengthening exercises 2-3 times per week for cardiovascular health, weight loss maintenance and preservation of muscle mass.   She has agreed to Continue current level of physical activity   FOLLOW UP: Return in about 2 weeks (around 08/23/2022). She was informed of the importance of frequent follow up visits to maximize her success with intensive lifestyle modifications for her multiple health conditions.  Subjective:   Chief complaint: Obesity Glada is here to discuss her progress with her obesity treatment plan. She is on the the Category 1 Plan with lunch options and states she is following her eating plan approximately 80% of the time. She states she is exercising 60 minutes 7 days per week.  Interval History:  AILY GOTTO is here for a follow up office visit.   This is patient's 5 office visit with [place].    Since last office visit:   - She endorses her weight gain this time is likely from eating more [employer] strawberry ice cream.  - She has been working on not skipping meals.  - She goes to a chiropractor every week for left leg pain/ache.   Review of Systems:  Pertinent positives were addressed with patient today.  Weight Summary and Biometrics   Weight Lost Since Last Visit: 0  Weight Gained Since Last Visit: 2 lb    Vitals Temp: 98.2 F (36.8 C) BP: 109/69 Pulse Rate: (!) 56 SpO2: 97 %   Anthropometric Measurements Height: 5' (1.524 m) Weight: 152 lb (68.9 kg) BMI (Calculated): 29.69 Weight at Last Visit: 150  lb Weight Lost Since Last Visit: 0 Weight Gained Since Last Visit: 2 lb Starting Weight: 158 lb Total Weight Loss (lbs): 6 lb (2.722 kg) Peak Weight: 190 lb   Body Composition  Body Fat %: 39.6 % Fat Mass (lbs): 60.2 lbs Muscle Mass (lbs): 87.4 lbs Total Body Water (lbs): 61.4 lbs Visceral Fat Rating : 12   Other Clinical Data Fasting: No Labs: No Today's Visit #: 5 Starting Date: 06/12/22   Objective:   PHYSICAL EXAM: Blood pressure 109/69, pulse (!) 56, temperature 98.2 F (36.8 C), height 5' (1.524 m), weight 152 lb (68.9 kg), SpO2 97 %. Body mass index is 29.69 kg/m.  General: Well Developed, well nourished, and in no acute distress.  HEENT: Normocephalic, atraumatic Skin: Warm and dry, cap RF less 2 sec, good turgor Chest:  Normal excursion, shape, no gross abn Respiratory: speaking in full sentences, no conversational dyspnea NeuroM-Sk: Ambulates w/o assistance, moves * 4 Psych: A and O *3, insight good, mood-full  DIAGNOSTIC DATA REVIEWED:  BMET    Component Value Date/Time   NA 143 07/23/2022 1015   K 4.7 07/23/2022 1015   CL 102 07/23/2022  1015   CO2 24 07/23/2022 1015   GLUCOSE 95 07/23/2022 1015   GLUCOSE 99 11/08/2010 1100   BUN 16 07/23/2022 1015   CREATININE 1.09 (H) 07/23/2022 1015   CALCIUM 10.6 (H) 07/23/2022 1015   GFRNONAA 63 12/31/2019 1429   GFRAA 73 12/31/2019 1429   Lab Results  Component Value Date   HGBA1C 5.9 (H) 06/12/2022   HGBA1C 5.6 12/17/2018   Lab Results  Component Value Date   INSULIN 11.8 06/12/2022   INSULIN 9.7 06/16/2019   Lab Results  Component Value Date   TSH 0.724 06/12/2022   CBC    Component Value Date/Time   WBC 4.3 06/12/2022 1227   WBC 12.7 (H) 08/15/2008 1804   RBC 4.60 06/12/2022 1227   RBC 4.31 08/15/2008 1804   HGB 13.2 06/12/2022 1227   HCT 40.8 06/12/2022 1227   PLT 372 06/12/2022 1227   MCV 89 06/12/2022 1227   MCH 28.7 06/12/2022 1227   MCHC 32.4 06/12/2022 1227   MCHC 34.4  08/15/2008 1804   RDW 14.0 06/12/2022 1227   Iron Studies No results found for: "IRON", "TIBC", "FERRITIN", "IRONPCTSAT" Lipid Panel     Component Value Date/Time   CHOL 262 (H) 06/12/2022 1227   TRIG 93 06/12/2022 1227   HDL 61 06/12/2022 1227   CHOLHDL 3.6 01/19/2022 1540   LDLCALC 185 (H) 06/12/2022 1227   Hepatic Function Panel     Component Value Date/Time   PROT 7.6 06/12/2022 1227   ALBUMIN 4.2 06/12/2022 1227   AST 20 06/12/2022 1227   ALT 13 06/12/2022 1227   ALKPHOS 85 06/12/2022 1227   BILITOT 0.3 06/12/2022 1227      Component Value Date/Time   TSH 0.724 06/12/2022 1227   Nutritional Lab Results  Component Value Date   VD25OH 28.9 (L) 06/12/2022   VD25OH 53.2 12/31/2019    Attestations:   Reviewed by clinician on day of visit: allergies, medications, problem list, medical history, surgical history, family history, social history, and previous encounter notes.  I,Special Puri,acting as a Neurosurgeon for [employer], DO.,have documented all relevant documentation on the behalf of Thomasene Lot, DO,as directed by  Thomasene Lot, DO while in the presence of Thomasene Lot, DO.   I, Thomasene Lot, DO, have reviewed all documentation for this visit. The documentation on 08/09/22 for the exam, diagnosis, procedures, and orders are all accurate and complete.

## 2022-08-15 ENCOUNTER — Ambulatory Visit (HOSPITAL_COMMUNITY): Payer: Medicare HMO

## 2022-08-15 DIAGNOSIS — M5441 Lumbago with sciatica, right side: Secondary | ICD-10-CM | POA: Diagnosis not present

## 2022-08-15 DIAGNOSIS — M9906 Segmental and somatic dysfunction of lower extremity: Secondary | ICD-10-CM | POA: Diagnosis not present

## 2022-08-15 DIAGNOSIS — M9901 Segmental and somatic dysfunction of cervical region: Secondary | ICD-10-CM | POA: Diagnosis not present

## 2022-08-15 DIAGNOSIS — M9903 Segmental and somatic dysfunction of lumbar region: Secondary | ICD-10-CM | POA: Diagnosis not present

## 2022-08-16 ENCOUNTER — Ambulatory Visit (HOSPITAL_COMMUNITY)
Admission: RE | Admit: 2022-08-16 | Discharge: 2022-08-16 | Disposition: A | Discharge status: Home / Self Care | Payer: Medicare HMO | Source: Ambulatory Visit | Attending: Internal Medicine | Admitting: Internal Medicine

## 2022-08-16 DIAGNOSIS — E7800 Pure hypercholesterolemia, unspecified: Secondary | ICD-10-CM | POA: Insufficient documentation

## 2022-08-22 DIAGNOSIS — M5441 Lumbago with sciatica, right side: Secondary | ICD-10-CM | POA: Diagnosis not present

## 2022-08-22 DIAGNOSIS — M9903 Segmental and somatic dysfunction of lumbar region: Secondary | ICD-10-CM | POA: Diagnosis not present

## 2022-08-22 DIAGNOSIS — M9906 Segmental and somatic dysfunction of lower extremity: Secondary | ICD-10-CM | POA: Diagnosis not present

## 2022-08-22 DIAGNOSIS — M9901 Segmental and somatic dysfunction of cervical region: Secondary | ICD-10-CM | POA: Diagnosis not present

## 2022-08-23 ENCOUNTER — Encounter (INDEPENDENT_AMBULATORY_CARE_PROVIDER_SITE_OTHER): Payer: Self-pay | Admitting: Family Medicine

## 2022-08-23 ENCOUNTER — Encounter: Payer: Self-pay | Admitting: Internal Medicine

## 2022-08-23 ENCOUNTER — Ambulatory Visit (INDEPENDENT_AMBULATORY_CARE_PROVIDER_SITE_OTHER): Payer: Medicare HMO | Admitting: Family Medicine

## 2022-08-23 VITALS — BP 100/68 | HR 73 | Temp 98.3°F | Ht 60.0 in | Wt 149.0 lb

## 2022-08-23 DIAGNOSIS — E6609 Other obesity due to excess calories: Secondary | ICD-10-CM

## 2022-08-23 DIAGNOSIS — Z6829 Body mass index (BMI) 29.0-29.9, adult: Secondary | ICD-10-CM

## 2022-08-23 DIAGNOSIS — I10 Essential (primary) hypertension: Secondary | ICD-10-CM | POA: Diagnosis not present

## 2022-08-23 DIAGNOSIS — E559 Vitamin D deficiency, unspecified: Secondary | ICD-10-CM | POA: Diagnosis not present

## 2022-08-23 MED ORDER — VITAMIN D (ERGOCALCIFEROL) 1.25 MG (50000 UNIT) PO CAPS
50000.0000 [IU] | ORAL_CAPSULE | ORAL | 0 refills | Status: DC
Start: 2022-08-23 — End: 2022-09-11

## 2022-08-23 MED ORDER — OLMESARTAN MEDOXOMIL-HCTZ 20-12.5 MG PO TABS: 1.0000 | ORAL_TABLET | Freq: Every day | ORAL | 0 refills | Status: DC

## 2022-08-23 NOTE — Progress Notes (Signed)
[name], D.O.  ABFM, ABOM Specializing in Clinical Bariatric Medicine  Office located at: [address] [place], [place]  [address]     Assessment and Plan:    Medications Discontinued During This Encounter  Medication Reason   olmesartan-hydrochlorothiazide (BENICAR HCT) 40-12.5 MG tablet    Vitamin D, Ergocalciferol, (DRISDOL) 1.25 MG (50000 UNIT) CAPS capsule Reorder     Meds ordered this encounter  Medications   Vitamin D, Ergocalciferol, (DRISDOL) 1.25 MG (50000 UNIT) CAPS capsule    Sig: Take 1 capsule (50,000 Units total) by mouth every 7 (seven) days.    Dispense:  4 capsule    Refill:  0   olmesartan-hydrochlorothiazide (BENICAR HCT) 20-12.5 MG tablet    Sig: Take 1 tablet by mouth daily.    Dispense:  30 tablet    Refill:  0     Essential hypertension Assessment: BP is slightly low.   Last 3 blood pressure readings in our office are as follows: BP Readings from Last 3 Encounters:  08/23/22 100/68  08/09/22 109/69  07/24/22 116/73  - Her blood pressure is slightly low today. She is asymptomatic, denies any dizziness or light headedness.  - She has been compliant with Benicar HCT 40-12.5 mg daily. Denies any side effects.  Plan: - Decrease Benicar HCT to 20-12.5 mg daily.  - I advised pt to check with her PCP  about this change in her bp medication that I made today. This change was made due to her decreasing blood pressure associated with her weight loss.  - Counseled Damaris Schooner on pathophysiology of disease and discussed treatment plan, which always includes dietary and lifestyle modification as first line.  - We will continue to monitor closely alongside PCP/ specialists.  Pt reminded to also f/up with those individuals as instructed by them.  - We will continue to monitor symptoms as they relate to the her weight loss journey.    Vitamin D deficiency Assessment: Condition is not optimized.  Lab Results  Component Value Date    VD25OH 28.9 (L) 06/12/2022   VD25OH 53.2 12/31/2019  - No issues with Ergocalciferol 50K IU weekly. Denies any side effects.   Plan: - Continue with med. Will refill this today.   - Will continue  to monitor levels regularly (every 3-4 mo on average) to keep levels within normal limits and prevent over supplementation.   TREATMENT PLAN FOR OBESITY: Class 1 obesity due to excess calories with serious comorbidity and body mass index (BMI) of 30.0 to 30.9 in adult  Assessment:  Sara Obrien is here to discuss her progress with her obesity treatment plan along with follow-up of her obesity related diagnoses. See Medical Weight Management Flowsheet for complete bioelectrical impedance results.  Condition is improving.  Biometric data collected today, was reviewed with patient.   Since last office visit on 08/09/22 patient's Fat mass has decreased by 3 lb. Muscle mass has increased by 0.2 lb. Total body water has decreased by 1.6 lb.  Counseling done on how various foods will affect these numbers and how to maximize success  Total lbs lost to date: -9  Total weight loss percentage to date: 5.70   Plan:  - Continue the Category 1 meal plan with lunch options  - Recommended pt to drink a bottle of water (16.9 ounces) for every bottle of water.   Behavioral Intervention Additional resources provided today: patient declined Evidence-based interventions for health behavior change were utilized today including  the discussion of self monitoring techniques, problem-solving barriers and SMART goal setting techniques.   Regarding patient's less desirable eating habits and patterns, we employed the technique of small changes.  Pt will specifically work on: continue adherence to prudent nutritional plan for next visit.    Recommended Physical Activity Goals  Shanaia has been advised to slowly work up to 150 minutes of moderate intensity aerobic activity a week and strengthening exercises 2-3 times  per week for cardiovascular health, weight loss maintenance and preservation of muscle mass.   She has agreed to Continue current level of physical activity    FOLLOW UP: Return in about 2 weeks (around 09/06/2022). She was informed of the importance of frequent follow up visits to maximize her success with intensive lifestyle modifications for her multiple health conditions.   Subjective:   Chief complaint: Obesity Katena is here to discuss her progress with her obesity treatment plan. She is on the the Category 1 Plan and states she is following her eating plan approximately 85% of the time. She states she is doing water aerobics and pickleball 60 minutes 5 days per week.  Interval History:  Sara Obrien is here for a follow up office visit.     Since last office visit:   - She is drinking 5 bottles of water daily. Endorses feeling thirsty at night, which is likely due to dehydration.  - Reports being more salt sensitive.  - Endorses "feeling more comfortable" with the meal.  -  When eating on plan, her hunger and cravings are well controlled.     Review of Systems:  Pertinent positives were addressed with patient today.  Weight Summary and Biometrics   Weight Lost Since Last Visit: 3lb  Weight Gained Since Last Visit: 0    Vitals Temp: 98.3 F (36.8 C) BP: 100/68 Pulse Rate: 73 SpO2: 99 %   Anthropometric Measurements Height: 5' (1.524 m) Weight: 149 lb (67.6 kg) BMI (Calculated): 29.1 Weight at Last Visit: 152lb Weight Lost Since Last Visit: 3lb Weight Gained Since Last Visit: 0 Starting Weight: 158lb Total Weight Loss (lbs): 9 lb (4.082 kg) Peak Weight: 190lb   Body Composition  Body Fat %: 38.3 % Fat Mass (lbs): 57.2 lbs Muscle Mass (lbs): 87.6 lbs Total Body Water (lbs): 59.8 lbs Visceral Fat Rating : 11   Other Clinical Data Fasting: no Labs: no Today's Visit #: 6 Starting Date: 06/12/22     Objective:   PHYSICAL EXAM: Blood pressure  100/68, pulse 73, temperature 98.3 F (36.8 C), height 5' (1.524 m), weight 149 lb (67.6 kg), SpO2 99 %. Body mass index is 29.1 kg/m.  General: Well Developed, well nourished, and in no acute distress.  HEENT: Normocephalic, atraumatic Skin: Warm and dry, cap RF less 2 sec, good turgor Chest:  Normal excursion, shape, no gross abn Respiratory: speaking in full sentences, no conversational dyspnea NeuroM-Sk: Ambulates w/o assistance, moves * 4 Psych: A and O *3, insight good, mood-full  DIAGNOSTIC DATA REVIEWED:  BMET    Component Value Date/Time   NA 143 07/23/2022 1015   K 4.7 07/23/2022 1015   CL 102 07/23/2022 1015   CO2 24 07/23/2022 1015   GLUCOSE 95 07/23/2022 1015   GLUCOSE 99 11/08/2010 1100   BUN 16 07/23/2022 1015   CREATININE 1.09 (H) 07/23/2022 1015   CALCIUM 10.6 (H) 07/23/2022 1015   GFRNONAA 63 12/31/2019 1429   GFRAA 73 12/31/2019 1429   Lab Results  Component Value Date  HGBA1C 5.9 (H) 06/12/2022   HGBA1C 5.6 12/17/2018   Lab Results  Component Value Date   INSULIN 11.8 06/12/2022   INSULIN 9.7 06/16/2019   Lab Results  Component Value Date   TSH 0.724 06/12/2022   CBC    Component Value Date/Time   WBC 4.3 06/12/2022 1227   WBC 12.7 (H) 08/15/2008 1804   RBC 4.60 06/12/2022 1227   RBC 4.31 08/15/2008 1804   HGB 13.2 06/12/2022 1227   HCT 40.8 06/12/2022 1227   PLT 372 06/12/2022 1227   MCV 89 06/12/2022 1227   MCH 28.7 06/12/2022 1227   MCHC 32.4 06/12/2022 1227   MCHC 34.4 08/15/2008 1804   RDW 14.0 06/12/2022 1227   Iron Studies No results found for: "IRON", "TIBC", "FERRITIN", "IRONPCTSAT" Lipid Panel     Component Value Date/Time   CHOL 262 (H) 06/12/2022 1227   TRIG 93 06/12/2022 1227   HDL 61 06/12/2022 1227   CHOLHDL 3.6 01/19/2022 1540   LDLCALC 185 (H) 06/12/2022 1227   Hepatic Function Panel     Component Value Date/Time   PROT 7.6 06/12/2022 1227   ALBUMIN 4.2 06/12/2022 1227   AST 20 06/12/2022 1227   ALT 13  06/12/2022 1227   ALKPHOS 85 06/12/2022 1227   BILITOT 0.3 06/12/2022 1227      Component Value Date/Time   TSH 0.724 06/12/2022 1227   Nutritional Lab Results  Component Value Date   VD25OH 28.9 (L) 06/12/2022   VD25OH 53.2 12/31/2019    Attestations:   Reviewed by clinician on day of visit: allergies, medications, problem list, medical history, surgical history, family history, social history, and previous encounter notes.   I,Special Puri,acting as a Neurosurgeon for [employer], DO.,have documented all relevant documentation on the behalf of Thomasene Lot, DO,as directed by  Thomasene Lot, DO while in the presence of Thomasene Lot, DO.   I, Thomasene Lot, DO, have reviewed all documentation for this visit. The documentation on 08/23/22 for the exam, diagnosis, procedures, and orders are all accurate and complete.

## 2022-08-29 DIAGNOSIS — M5441 Lumbago with sciatica, right side: Secondary | ICD-10-CM | POA: Diagnosis not present

## 2022-08-29 DIAGNOSIS — M9901 Segmental and somatic dysfunction of cervical region: Secondary | ICD-10-CM | POA: Diagnosis not present

## 2022-08-29 DIAGNOSIS — M9906 Segmental and somatic dysfunction of lower extremity: Secondary | ICD-10-CM | POA: Diagnosis not present

## 2022-08-29 DIAGNOSIS — M9903 Segmental and somatic dysfunction of lumbar region: Secondary | ICD-10-CM | POA: Diagnosis not present

## 2022-09-04 ENCOUNTER — Ambulatory Visit: Payer: Medicare HMO

## 2022-09-04 VITALS — BP 108/62 | HR 85 | Temp 98.5°F | Ht 60.0 in | Wt 149.0 lb

## 2022-09-04 DIAGNOSIS — I10 Essential (primary) hypertension: Secondary | ICD-10-CM

## 2022-09-04 NOTE — Patient Instructions (Signed)
Hypertension, Adult Hypertension is another name for high blood pressure. High blood pressure forces your heart to work harder to pump blood. This can cause problems over time. There are two numbers in a blood pressure reading. There is a top number (systolic) over a bottom number (diastolic). It is best to have a blood pressure that is below 120/80. What are the causes? The cause of this condition is not known. Some other conditions can lead to high blood pressure. What increases the risk? Some lifestyle factors can make you more likely to develop high blood pressure: Smoking. Not getting enough exercise or physical activity. Being overweight. Having too much fat, sugar, calories, or salt (sodium) in your diet. Drinking too much alcohol. Other risk factors include: Having any of these conditions: Heart disease. Diabetes. High cholesterol. Kidney disease. Obstructive sleep apnea. Having a family history of high blood pressure and high cholesterol. Age. The risk increases with age. Stress. What are the signs or symptoms? High blood pressure may not cause symptoms. Very high blood pressure (hypertensive crisis) may cause: Headache. Fast or uneven heartbeats (palpitations). Shortness of breath. Nosebleed. Vomiting or feeling like you may vomit (nauseous). Changes in how you see. Very bad chest pain. Feeling dizzy. Seizures. How is this treated? This condition is treated by making healthy lifestyle changes, such as: Eating healthy foods. Exercising more. Drinking less alcohol. Your doctor may prescribe medicine if lifestyle changes do not help enough and if: Your top number is above 130. Your bottom number is above 80. Your personal target blood pressure may vary. Follow these instructions at home: Eating and drinking  If told, follow the DASH eating plan. To follow this plan: Fill one half of your plate at each meal with fruits and vegetables. Fill one fourth of your plate  at each meal with whole grains. Whole grains include whole-wheat pasta, brown rice, and whole-grain bread. Eat or drink low-fat dairy products, such as skim milk or low-fat yogurt. Fill one fourth of your plate at each meal with low-fat (lean) proteins. Low-fat proteins include fish, chicken without skin, eggs, beans, and tofu. Avoid fatty meat, cured and processed meat, or chicken with skin. Avoid pre-made or processed food. Limit the amount of salt in your diet to less than 1,500 mg each day. Do not drink alcohol if: Your doctor tells you not to drink. You are pregnant, may be pregnant, or are planning to become pregnant. If you drink alcohol: Limit how much you have to: 0-1 drink a day for women. 0-2 drinks a day for men. Know how much alcohol is in your drink. In the [place] one drink equals one 12 oz bottle of beer (355 mL), one 5 oz glass of wine (148 mL), or one 1 oz glass of hard liquor (44 mL). Lifestyle  Work with your doctor to stay at a healthy weight or to lose weight. Ask your doctor what the best weight is for you. Get at least 30 minutes of exercise that causes your heart to beat faster (aerobic exercise) most days of the week. This may include walking, swimming, or biking. Get at least 30 minutes of exercise that strengthens your muscles (resistance exercise) at least 3 days a week. This may include lifting weights or doing Pilates. Do not smoke or use any products that contain nicotine or tobacco. If you need help quitting, ask your doctor. Check your blood pressure at home as told by your doctor. Keep all follow-up visits. Medicines Take over-the-counter and prescription medicines   only as told by your doctor. Follow directions carefully. Do not skip doses of blood pressure medicine. The medicine does not work as well if you skip doses. Skipping doses also puts you at risk for problems. Ask your doctor about side effects or reactions to medicines that you should watch  for. Contact a doctor if: You think you are having a reaction to the medicine you are taking. You have headaches that keep coming back. You feel dizzy. You have swelling in your ankles. You have trouble with your vision. Get help right away if: You get a very bad headache. You start to feel mixed up (confused). You feel weak or numb. You feel faint. You have very bad pain in your: Chest. Belly (abdomen). You vomit more than once. You have trouble breathing. These symptoms may be an emergency. Get help right away. Call 911. Do not wait to see if the symptoms will go away. Do not drive yourself to the hospital. Summary Hypertension is another name for high blood pressure. High blood pressure forces your heart to work harder to pump blood. For most people, a normal blood pressure is less than 120/80. Making healthy choices can help lower blood pressure. If your blood pressure does not get lower with healthy choices, you may need to take medicine. This information is not intended to replace advice given to you by your health care provider. Make sure you discuss any questions you have with your health care provider. Document Revised: 01/05/2021 Document Reviewed: 01/05/2021 Elsevier Patient Education  2024 Elsevier Inc.  

## 2022-09-04 NOTE — Progress Notes (Signed)
Patient presents today for a BPC, patient is currently taking olmesartan-hctz 20-12.5mg  PM. 1/2 tablet   BP Readings from Last 3 Encounters:  09/04/22 130/70  08/23/22 100/68  08/09/22 109/69  Per provider- f/u next visit.

## 2022-09-05 DIAGNOSIS — M5441 Lumbago with sciatica, right side: Secondary | ICD-10-CM | POA: Diagnosis not present

## 2022-09-05 DIAGNOSIS — M9906 Segmental and somatic dysfunction of lower extremity: Secondary | ICD-10-CM | POA: Diagnosis not present

## 2022-09-05 DIAGNOSIS — M9901 Segmental and somatic dysfunction of cervical region: Secondary | ICD-10-CM | POA: Diagnosis not present

## 2022-09-05 DIAGNOSIS — M9903 Segmental and somatic dysfunction of lumbar region: Secondary | ICD-10-CM | POA: Diagnosis not present

## 2022-09-11 ENCOUNTER — Ambulatory Visit (INDEPENDENT_AMBULATORY_CARE_PROVIDER_SITE_OTHER): Payer: Medicare HMO | Admitting: Family Medicine

## 2022-09-11 ENCOUNTER — Encounter (INDEPENDENT_AMBULATORY_CARE_PROVIDER_SITE_OTHER): Payer: Self-pay | Admitting: Family Medicine

## 2022-09-11 VITALS — BP 106/70 | HR 61 | Temp 98.4°F | Ht 60.0 in | Wt 149.0 lb

## 2022-09-11 DIAGNOSIS — I10 Essential (primary) hypertension: Secondary | ICD-10-CM

## 2022-09-11 DIAGNOSIS — Z6830 Body mass index (BMI) 30.0-30.9, adult: Secondary | ICD-10-CM | POA: Diagnosis not present

## 2022-09-11 DIAGNOSIS — E6609 Other obesity due to excess calories: Secondary | ICD-10-CM

## 2022-09-11 DIAGNOSIS — E782 Mixed hyperlipidemia: Secondary | ICD-10-CM

## 2022-09-11 DIAGNOSIS — E559 Vitamin D deficiency, unspecified: Secondary | ICD-10-CM

## 2022-09-11 MED ORDER — VITAMIN D (ERGOCALCIFEROL) 1.25 MG (50000 UNIT) PO CAPS
50000.0000 [IU] | ORAL_CAPSULE | ORAL | 0 refills | Status: DC
Start: 2022-09-11 — End: 2022-10-23

## 2022-09-11 NOTE — Progress Notes (Signed)
[name], D.O.  ABFM, ABOM Specializing in Clinical Bariatric Medicine  Office located at: [address] [place], [place]  [address]     Assessment and Plan:   Medications Discontinued During This Encounter  Medication Reason   Vitamin D, Ergocalciferol, (DRISDOL) 1.25 MG (50000 UNIT) CAPS capsule Reorder     Meds ordered this encounter  Medications   Vitamin D, Ergocalciferol, (DRISDOL) 1.25 MG (50000 UNIT) CAPS capsule    Sig: Take 1 capsule (50,000 Units total) by mouth every 7 (seven) days.    Dispense:  4 capsule    Refill:  0    Obtain Fasting Labs next OV   Essential hypertension Assessment: Her blood pressure is stable today. Denies any dizziness/light headedness. No issues with Benicar HCT 20-12.5 mg daily. Denies any adverse effects.  Last 3 blood pressure readings in our office are as follows: BP Readings from Last 3 Encounters:  09/11/22 106/70  09/04/22 108/62  08/23/22 100/68   Plan:Continue with med at current dosage. Continue with Prudent nutritional plan and low sodium diet, advance exercise as tolerated.  We will continue to monitor symptoms as they relate to the her weight loss journey.    Vitamin D deficiency Assessment: Condition is not optimized. Compliance of Ergocalciferol 50K IU weekly good since last labs were reviewed. Denies any N/V/D. Patient had her last bone density on 12/15/20.  Lab Results  Component Value Date   VD25OH 28.9 (L) 06/12/2022   VD25OH 53.2 12/31/2019   Plan: Continue with supplement. Will reorder this today. I recommended patient to schedule a bone density around 12/16/2022. We will continue to monitor Vitamin D levels regularly (every 3-4 mo on average) to keep levels within normal limits and prevent over supplementation.   Mixed hyperlipidemia Assessment: Condition is not optimized. Patient had a coronary calcium scan on 08/16/2022 at [facility]. Her Coronary Calcium score was elevated (5.49) and  is at the 39th percentile for her age. She has been compliant and tolerant with Lipitor 20 mg daily. Denies any adverse effects.  Lab Results  Component Value Date   CHOL 262 (H) 06/12/2022   HDL 61 06/12/2022   LDLCALC 185 (H) 06/12/2022   TRIG 93 06/12/2022   CHOLHDL 3.6 01/19/2022   Plan: I will recheck labs next OV and if her LDL levels have not improved, I may consider making some changes in the management of this condition. For now, Sara Obrien will continue with Lipitor and our treatment plan of a heart-heathy, low cholesterol meal plan.   TREATMENT PLAN FOR OBESITY: Class 1 obesity due to excess calories with serious comorbidity and body mass index (BMI) of 30.0 to 30.9 in adult Assessment:  Sara Obrien is here to discuss her progress with her obesity treatment plan along with follow-up of her obesity related diagnoses. See Medical Weight Management Flowsheet for complete bioelectrical impedance results.  Condition is stable. Biometric data collected today, was reviewed with patient.   Since last office visit on 08/23/22 patient's  Muscle mass has decreased by 0.6 lb. Fat mass has increased by 0.8 lb. Total body water has decreased by 0.8 lb.  Counseling done on how various foods will affect these numbers and how to maximize success  Total lbs lost to date: 9  Total weight loss percentage to date: 5.70   Plan: Continue the Category 1 meal plan with lunch options.  Behavioral Intervention Additional resources provided today: Band it exercise handout Evidence-based interventions for health behavior change  were utilized today including the discussion of self monitoring techniques, problem-solving barriers and SMART goal setting techniques.   Regarding patient's less desirable eating habits and patterns, we employed the technique of small changes.  Pt will specifically work on: 2 sets of 10-15 reps, 2 days a week of exercises from Band It handout for next visit.    Recommended  Physical Activity Goals  Pecola has been advised to slowly work up to 150 minutes of moderate intensity aerobic activity a week and strengthening exercises 2-3 times per week for cardiovascular health, weight loss maintenance and preservation of muscle mass.   She has agreed to think about ways to add weight lighting/resistance training into her exercise regiment.   FOLLOW UP: Return in about 3 weeks (around 10/02/2022). She was informed of the importance of frequent follow up visits to maximize her success with intensive lifestyle modifications for her multiple health conditions.   Subjective:   Chief complaint: Obesity Sara Obrien is here to discuss her progress with her obesity treatment plan. She is on the the Category 1 Plan  with breakfast and lunch options and states she is following her eating plan approximately 50% of the time. She states she is doing pickleball and water aerobics 30/60 minutes 4/3 days per week.  Interval History:  Sara Obrien is here for a follow up office visit.     Since last office visit:    - Luticia has been doing well and she endorses that she choose to eat off plan on a few occasions in the last two weeks.  - When eating on plan, her hunger and cravings are well controlled.     - She is exercising regularly by doing pickleball and water aerobics.   Review of Systems:  Pertinent positives were addressed with patient today.  Weight Summary and Biometrics   Weight Lost Since Last Visit: 0  Weight Gained Since Last Visit: 0    Vitals Temp: 98.4 F (36.9 C) BP: 106/70 Pulse Rate: 61 SpO2: 97 %   Anthropometric Measurements Height: 5' (1.524 m) Weight: 149 lb (67.6 kg) BMI (Calculated): 29.1 Weight at Last Visit: 149lb Weight Lost Since Last Visit: 0 Weight Gained Since Last Visit: 0 Starting Weight: 158lb Total Weight Loss (lbs): 9 lb (4.082 kg) Peak Weight: 190lb   Body Composition  Body Fat %: 38.8 % Fat Mass (lbs): 58  lbs Muscle Mass (lbs): 87 lbs Total Body Water (lbs): 59 lbs Visceral Fat Rating : 12   Other Clinical Data Fasting: yes Labs: no Today's Visit #: 7 Starting Date: 06/12/22     Objective:   PHYSICAL EXAM: Blood pressure 106/70, pulse 61, temperature 98.4 F (36.9 C), height 5' (1.524 m), weight 149 lb (67.6 kg), SpO2 97 %. Body mass index is 29.1 kg/m.  General: Well Developed, well nourished, and in no acute distress.  HEENT: Normocephalic, atraumatic Skin: Warm and dry, cap RF less 2 sec, good turgor Chest:  Normal excursion, shape, no gross abn Respiratory: speaking in full sentences, no conversational dyspnea NeuroM-Sk: Ambulates w/o assistance, moves * 4 Psych: A and O *3, insight good, mood-full  DIAGNOSTIC DATA REVIEWED:  BMET    Component Value Date/Time   NA 143 07/23/2022 1015   K 4.7 07/23/2022 1015   CL 102 07/23/2022 1015   CO2 24 07/23/2022 1015   GLUCOSE 95 07/23/2022 1015   GLUCOSE 99 11/08/2010 1100   BUN 16 07/23/2022 1015   CREATININE 1.09 (H) 07/23/2022 1015   CALCIUM  10.6 (H) 07/23/2022 1015   GFRNONAA 63 12/31/2019 1429   GFRAA 73 12/31/2019 1429   Lab Results  Component Value Date   HGBA1C 5.9 (H) 06/12/2022   HGBA1C 5.6 12/17/2018   Lab Results  Component Value Date   INSULIN 11.8 06/12/2022   INSULIN 9.7 06/16/2019   Lab Results  Component Value Date   TSH 0.724 06/12/2022   CBC    Component Value Date/Time   WBC 4.3 06/12/2022 1227   WBC 12.7 (H) 08/15/2008 1804   RBC 4.60 06/12/2022 1227   RBC 4.31 08/15/2008 1804   HGB 13.2 06/12/2022 1227   HCT 40.8 06/12/2022 1227   PLT 372 06/12/2022 1227   MCV 89 06/12/2022 1227   MCH 28.7 06/12/2022 1227   MCHC 32.4 06/12/2022 1227   MCHC 34.4 08/15/2008 1804   RDW 14.0 06/12/2022 1227   Iron Studies No results found for: "IRON", "TIBC", "FERRITIN", "IRONPCTSAT" Lipid Panel     Component Value Date/Time   CHOL 262 (H) 06/12/2022 1227   TRIG 93 06/12/2022 1227   HDL  61 06/12/2022 1227   CHOLHDL 3.6 01/19/2022 1540   LDLCALC 185 (H) 06/12/2022 1227   Hepatic Function Panel     Component Value Date/Time   PROT 7.6 06/12/2022 1227   ALBUMIN 4.2 06/12/2022 1227   AST 20 06/12/2022 1227   ALT 13 06/12/2022 1227   ALKPHOS 85 06/12/2022 1227   BILITOT 0.3 06/12/2022 1227      Component Value Date/Time   TSH 0.724 06/12/2022 1227   Nutritional Lab Results  Component Value Date   VD25OH 28.9 (L) 06/12/2022   VD25OH 53.2 12/31/2019    Attestations:   Reviewed by clinician on day of visit: allergies, medications, problem list, medical history, surgical history, family history, social history, and previous encounter notes.   I, Special Randolm Idol, acting as a Stage manager for [employer], DO., have compiled all relevant documentation for today's office visit on behalf of Thomasene Lot, DO, while in the presence of [employer], DO.   I have reviewed the above documentation for accuracy and completeness, and I agree with the above. [name], D.O.   The 21st Century Cures Act was signed into law in 2016 which includes the topic of electronic health records.  This provides immediate access to information in MyChart.  This includes consultation notes, operative notes, office notes, lab results and pathology reports.  If you have any questions about what you read please let us know at your next visit so we can discuss your concerns and take corrective action if need be.  We are right here with you.

## 2022-09-12 DIAGNOSIS — M9906 Segmental and somatic dysfunction of lower extremity: Secondary | ICD-10-CM | POA: Diagnosis not present

## 2022-09-12 DIAGNOSIS — M9903 Segmental and somatic dysfunction of lumbar region: Secondary | ICD-10-CM | POA: Diagnosis not present

## 2022-09-12 DIAGNOSIS — M9901 Segmental and somatic dysfunction of cervical region: Secondary | ICD-10-CM | POA: Diagnosis not present

## 2022-09-12 DIAGNOSIS — M5441 Lumbago with sciatica, right side: Secondary | ICD-10-CM | POA: Diagnosis not present

## 2022-09-16 ENCOUNTER — Other Ambulatory Visit: Payer: Self-pay | Admitting: Internal Medicine

## 2022-09-26 DIAGNOSIS — M9901 Segmental and somatic dysfunction of cervical region: Secondary | ICD-10-CM | POA: Diagnosis not present

## 2022-09-26 DIAGNOSIS — M9906 Segmental and somatic dysfunction of lower extremity: Secondary | ICD-10-CM | POA: Diagnosis not present

## 2022-09-26 DIAGNOSIS — M5441 Lumbago with sciatica, right side: Secondary | ICD-10-CM | POA: Diagnosis not present

## 2022-09-26 DIAGNOSIS — M9903 Segmental and somatic dysfunction of lumbar region: Secondary | ICD-10-CM | POA: Diagnosis not present

## 2022-09-27 ENCOUNTER — Ambulatory Visit (INDEPENDENT_AMBULATORY_CARE_PROVIDER_SITE_OTHER): Payer: Medicare HMO | Admitting: Family Medicine

## 2022-09-27 DIAGNOSIS — H20011 Primary iridocyclitis, right eye: Secondary | ICD-10-CM | POA: Diagnosis not present

## 2022-09-27 DIAGNOSIS — I10 Essential (primary) hypertension: Secondary | ICD-10-CM | POA: Diagnosis not present

## 2022-09-27 DIAGNOSIS — Z0100 Encounter for examination of eyes and vision without abnormal findings: Secondary | ICD-10-CM | POA: Diagnosis not present

## 2022-10-03 DIAGNOSIS — M9906 Segmental and somatic dysfunction of lower extremity: Secondary | ICD-10-CM | POA: Diagnosis not present

## 2022-10-03 DIAGNOSIS — M5441 Lumbago with sciatica, right side: Secondary | ICD-10-CM | POA: Diagnosis not present

## 2022-10-03 DIAGNOSIS — M9903 Segmental and somatic dysfunction of lumbar region: Secondary | ICD-10-CM | POA: Diagnosis not present

## 2022-10-03 DIAGNOSIS — M9901 Segmental and somatic dysfunction of cervical region: Secondary | ICD-10-CM | POA: Diagnosis not present

## 2022-10-10 DIAGNOSIS — M9906 Segmental and somatic dysfunction of lower extremity: Secondary | ICD-10-CM | POA: Diagnosis not present

## 2022-10-10 DIAGNOSIS — M9901 Segmental and somatic dysfunction of cervical region: Secondary | ICD-10-CM | POA: Diagnosis not present

## 2022-10-10 DIAGNOSIS — M9903 Segmental and somatic dysfunction of lumbar region: Secondary | ICD-10-CM | POA: Diagnosis not present

## 2022-10-10 DIAGNOSIS — M5441 Lumbago with sciatica, right side: Secondary | ICD-10-CM | POA: Diagnosis not present

## 2022-10-15 ENCOUNTER — Other Ambulatory Visit: Payer: Self-pay | Admitting: Internal Medicine

## 2022-10-17 DIAGNOSIS — M9901 Segmental and somatic dysfunction of cervical region: Secondary | ICD-10-CM | POA: Diagnosis not present

## 2022-10-17 DIAGNOSIS — M5441 Lumbago with sciatica, right side: Secondary | ICD-10-CM | POA: Diagnosis not present

## 2022-10-17 DIAGNOSIS — M9906 Segmental and somatic dysfunction of lower extremity: Secondary | ICD-10-CM | POA: Diagnosis not present

## 2022-10-17 DIAGNOSIS — M9903 Segmental and somatic dysfunction of lumbar region: Secondary | ICD-10-CM | POA: Diagnosis not present

## 2022-10-22 ENCOUNTER — Other Ambulatory Visit: Payer: Self-pay

## 2022-10-22 MED ORDER — OLMESARTAN MEDOXOMIL-HCTZ 20-12.5 MG PO TABS: 1.0000 | ORAL_TABLET | Freq: Every day | ORAL | 0 refills | Status: DC

## 2022-10-23 ENCOUNTER — Other Ambulatory Visit (INDEPENDENT_AMBULATORY_CARE_PROVIDER_SITE_OTHER): Payer: Self-pay | Admitting: Family Medicine

## 2022-10-23 ENCOUNTER — Ambulatory Visit (INDEPENDENT_AMBULATORY_CARE_PROVIDER_SITE_OTHER): Payer: Medicare HMO | Admitting: Family Medicine

## 2022-10-23 ENCOUNTER — Encounter (INDEPENDENT_AMBULATORY_CARE_PROVIDER_SITE_OTHER): Payer: Self-pay | Admitting: Family Medicine

## 2022-10-23 VITALS — BP 124/81 | HR 62 | Temp 98.2°F | Ht 60.0 in | Wt 149.0 lb

## 2022-10-23 DIAGNOSIS — Z6829 Body mass index (BMI) 29.0-29.9, adult: Secondary | ICD-10-CM

## 2022-10-23 DIAGNOSIS — R748 Abnormal levels of other serum enzymes: Secondary | ICD-10-CM | POA: Diagnosis not present

## 2022-10-23 DIAGNOSIS — R7303 Prediabetes: Secondary | ICD-10-CM

## 2022-10-23 DIAGNOSIS — E669 Obesity, unspecified: Secondary | ICD-10-CM | POA: Diagnosis not present

## 2022-10-23 DIAGNOSIS — E559 Vitamin D deficiency, unspecified: Secondary | ICD-10-CM | POA: Diagnosis not present

## 2022-10-23 DIAGNOSIS — F430 Acute stress reaction: Secondary | ICD-10-CM

## 2022-10-23 DIAGNOSIS — E782 Mixed hyperlipidemia: Secondary | ICD-10-CM | POA: Diagnosis not present

## 2022-10-23 MED ORDER — VITAMIN D (ERGOCALCIFEROL) 1.25 MG (50000 UNIT) PO CAPS
50000.0000 [IU] | ORAL_CAPSULE | ORAL | 0 refills | Status: DC
Start: 2022-10-23 — End: 2022-11-22

## 2022-10-23 NOTE — Progress Notes (Signed)
Sara Obrien, D.O.  ABFM, ABOM Specializing in Clinical Bariatric Medicine  Office located at: [address] [place], [place]  [address]     Assessment and Plan:   Orders Placed This Encounter  Procedures   Comprehensive metabolic panel   Hemoglobin A1c   Lipid Panel With LDL/HDL Ratio   VITAMIN D 25 Hydroxy (Vit-D Deficiency, Fractures)   Vitamin B12   Insulin, random    Medications Discontinued During This Encounter  Medication Reason   Vitamin D, Ergocalciferol, (DRISDOL) 1.25 MG (50000 UNIT) CAPS capsule Reorder     Meds ordered this encounter  Medications   Vitamin D, Ergocalciferol, (DRISDOL) 1.25 MG (50000 UNIT) CAPS capsule    Sig: Take 1 capsule (50,000 Units total) by mouth every 7 (seven) days.    Dispense:  4 capsule    Refill:  0     Prediabetes Assessment & Plan: Condition is currently diet/exercise managed. Sara Obrien has no complaints of hunger and cravings.  Lab Results  Component Value Date   HGBA1C 5.9 (H) 06/12/2022   HGBA1C 5.9 (H) 01/19/2022   HGBA1C 5.8 (H) 07/06/2021   INSULIN 11.8 06/12/2022   INSULIN 9.7 06/16/2019    Recheck labs. Sara Obrien will continue to work on weight loss, exercise, via their meal plan we devised to help decrease the risk of progressing to diabetes. Will continue to monitor condition alongside PCP/specialists as it relates to their weight loss journey    Mixed hyperlipidemia Assessment & Plan: Her hyperlipidemia is being treated with Lipitor 20 mg daily and she is responding to medication well- denies any side effects.   Lab Results  Component Value Date   CHOL 262 (H) 06/12/2022   HDL 61 06/12/2022   LDLCALC 185 (H) 06/12/2022   TRIG 93 06/12/2022   CHOLHDL 3.6 01/19/2022   Recheck labs today. Maintain with Lipitor at current dose and our treatment plan of a heart-heathy, low cholesterol meal plan. Will continue to monitor condition alongside PCP/specialists as it relates to their weight loss  journey    Vitamin D deficiency Assessment & Plan: Condition is being treated with ERGO 50K lU weekly and she endorses tolerating supplement well - denies any side effects.   Lab Results  Component Value Date   VD25OH 28.9 (L) 06/12/2022   VD25OH 53.2 12/31/2019   Recheck labs today. Weight loss will likely improve availability of vitamin D, thus encouraged Sara Obrien to continue with meal plan, their weight loss efforts, and ERGO at current dose  to further improve this condition. Will refill ERGO today.     Acute reaction to stress Assessment & Plan:  Pt endorses feeling sad recently due to a death in her family. As a result, she reports playing less pickleball, not sleeping very restfully, skipping meals, and following the meal plan less closely. She wishes to maintain with her current dose of Lexapro.   Continue with mood medication per Dr.Sanders. Behavior modification techniques were discussed today to help with Sara Obrien's recent mood struggles, such as meditation through the Calm App and/or Head space, self care activities like adequate sleep (7-9 hrs/nite), and exercise. Reminded patient of the importance of following their prudent nutrition plan and how food can affect mood as well to support emotional wellbeing. We will continue to monitor closely alongside PCP / other specialists.    TREATMENT PLAN FOR OBESITY: BMI 29.0-29.9,adult- current bmi 29.1 Obesity, starting bmi 30.97/date 06/12/22 Assessment & Plan:  Sara Obrien is here to discuss her progress  with her obesity treatment plan along with follow-up of her obesity related diagnoses. See Medical Weight Management Flowsheet for complete bioelectrical impedance results.  Condition is not optimized.  Biometric data collected today, was reviewed with patient.   Since last office visit on 09/11/22 patient's  Muscle mass has decreased by 2.4 lb. Fat mass has increased by 1.8 lb. Total body water has increased by 2.2 lb.   Counseling done on how various foods will affect these numbers and how to maximize success  Total lbs lost to date: 9 lbs  Total weight loss percentage to date: 5.70%  Continue the Category 1 meal with breakfast and lunch options  Behavioral Intervention Additional resources provided today: patient declined Evidence-based interventions for health behavior change were utilized today including the discussion of self monitoring techniques, problem-solving barriers and SMART goal setting techniques.   Regarding patient's less desirable eating habits and patterns, we employed the technique of small changes.  Pt will specifically work on: increasing Pickleball to 3+ days a wk.  for next visit.    Recommended Physical Activity Goals  Samhita has been advised to slowly work up to 150 minutes of moderate intensity aerobic activity a week and strengthening exercises 2-3 times per week for cardiovascular health, weight loss maintenance and preservation of muscle mass.   She has agreed to Continue current level of physical activity   FOLLOW UP: Return in about 3 weeks (around 11/13/2022). She was informed of the importance of frequent follow up visits to maximize her success with intensive lifestyle modifications for her multiple health conditions.  Sara Obrien is aware that we will review all of her lab results at our next visit.  She is aware that if anything is critical/ life threatening with the results, we will be contacting her via MyChart prior to the office visit to discuss management.    Subjective:   Chief complaint: Obesity Sara Obrien is here to discuss her progress with her obesity treatment plan. She is on the Category 1 Plan with breakfast and lunch options and states she is following her eating plan approximately 50% of the time. She states she is doing yoga, walking, and pickleball 30-60 minutes, 3-4 days per week.  Interval History:  Sara Obrien is here for a follow up office  visit. Since last OV, Sara Obrien traveled to [place] due to a death in her family. Endorses being "on and off" with the meal plan lately and sometimes skipping meals. She did some of the resistance band exercises and enjoyed them. Her hunger and cravings are pretty well controlled when following her meal plan.   Review of Systems:  Pertinent positives were addressed with patient today.  Reviewed by clinician on day of visit: allergies, medications, problem list, medical history, surgical history, family history, social history, and previous encounter notes.  Weight Summary and Biometrics   Weight Lost Since Last Visit: 0lb  Weight Gained Since Last Visit: 0lb   Vitals Temp: 98.2 F (36.8 C) BP: 124/81 Pulse Rate: 62 SpO2: 99 %   Anthropometric Measurements Height: 5' (1.524 m) Weight: 149 lb (67.6 kg) BMI (Calculated): 29.1 Weight at Last Visit: 149lb Weight Lost Since Last Visit: 0lb Weight Gained Since Last Visit: 0lb Starting Weight: 158lb Total Weight Loss (lbs): 9 lb (4.082 kg) Peak Weight: 190lb   Body Composition  Body Fat %: 40.1 % Fat Mass (lbs): 59.8 lbs Muscle Mass (lbs): 84.6 lbs Total Body Water (lbs): 61.2 lbs Visceral Fat Rating : 12  Other Clinical Data Fasting: yes Labs: yes Today's Visit #: 8 Starting Date: 06/12/22   Objective:   PHYSICAL EXAM: Blood pressure 124/81, pulse 62, temperature 98.2 F (36.8 C), height 5' (1.524 m), weight 149 lb (67.6 kg), SpO2 99%. Body mass index is 29.1 kg/m.  General: Well Developed, well nourished, and in no acute distress.  HEENT: Normocephalic, atraumatic Skin: Warm and dry, cap RF less 2 sec, good turgor Chest:  Normal excursion, shape, no gross abn Respiratory: speaking in full sentences, no conversational dyspnea NeuroM-Sk: Ambulates w/o assistance, moves * 4 Psych: A and O *3, insight good, mood-full  DIAGNOSTIC DATA REVIEWED:  BMET    Component Value Date/Time   NA 143 07/23/2022 1015    K 4.7 07/23/2022 1015   CL 102 07/23/2022 1015   CO2 24 07/23/2022 1015   GLUCOSE 95 07/23/2022 1015   GLUCOSE 99 11/08/2010 1100   BUN 16 07/23/2022 1015   CREATININE 1.09 (H) 07/23/2022 1015   CALCIUM 10.6 (H) 07/23/2022 1015   GFRNONAA 63 12/31/2019 1429   GFRAA 73 12/31/2019 1429   Lab Results  Component Value Date   HGBA1C 5.9 (H) 06/12/2022   HGBA1C 5.6 12/17/2018   Lab Results  Component Value Date   INSULIN 11.8 06/12/2022   INSULIN 9.7 06/16/2019   Lab Results  Component Value Date   TSH 0.724 06/12/2022   CBC    Component Value Date/Time   WBC 4.3 06/12/2022 1227   WBC 12.7 (H) 08/15/2008 1804   RBC 4.60 06/12/2022 1227   RBC 4.31 08/15/2008 1804   HGB 13.2 06/12/2022 1227   HCT 40.8 06/12/2022 1227   PLT 372 06/12/2022 1227   MCV 89 06/12/2022 1227   MCH 28.7 06/12/2022 1227   MCHC 32.4 06/12/2022 1227   MCHC 34.4 08/15/2008 1804   RDW 14.0 06/12/2022 1227   Iron Studies No results found for: "IRON", "TIBC", "FERRITIN", "IRONPCTSAT" Lipid Panel     Component Value Date/Time   CHOL 262 (H) 06/12/2022 1227   TRIG 93 06/12/2022 1227   HDL 61 06/12/2022 1227   CHOLHDL 3.6 01/19/2022 1540   LDLCALC 185 (H) 06/12/2022 1227   Hepatic Function Panel     Component Value Date/Time   PROT 7.6 06/12/2022 1227   ALBUMIN 4.2 06/12/2022 1227   AST 20 06/12/2022 1227   ALT 13 06/12/2022 1227   ALKPHOS 85 06/12/2022 1227   BILITOT 0.3 06/12/2022 1227      Component Value Date/Time   TSH 0.724 06/12/2022 1227   Nutritional Lab Results  Component Value Date   VD25OH 28.9 (L) 06/12/2022   VD25OH 53.2 12/31/2019    Attestations:   I, Special Puri , acting as a Stage manager for [employer], DO., have compiled all relevant documentation for today's office visit on behalf of Thomasene Lot, DO, while in the presence of [employer], DO.  I have reviewed the above documentation for accuracy and completeness, and I agree with the above.  Sara Obrien, D.O.  The 21st Century Cures Act was signed into law in 2016 which includes the topic of electronic health records.  This provides immediate access to information in MyChart.  This includes consultation notes, operative notes, office notes, lab results and pathology reports.  If you have any questions about what you read please let us know at your next visit so we can discuss your concerns and take corrective action if need be.  We are right here with you.

## 2022-10-24 ENCOUNTER — Other Ambulatory Visit (INDEPENDENT_AMBULATORY_CARE_PROVIDER_SITE_OTHER): Payer: Self-pay | Admitting: Family Medicine

## 2022-10-24 DIAGNOSIS — M9903 Segmental and somatic dysfunction of lumbar region: Secondary | ICD-10-CM | POA: Diagnosis not present

## 2022-10-24 DIAGNOSIS — R5383 Other fatigue: Secondary | ICD-10-CM | POA: Diagnosis not present

## 2022-10-24 DIAGNOSIS — R7303 Prediabetes: Secondary | ICD-10-CM | POA: Diagnosis not present

## 2022-10-24 DIAGNOSIS — E559 Vitamin D deficiency, unspecified: Secondary | ICD-10-CM | POA: Diagnosis not present

## 2022-10-24 DIAGNOSIS — M9906 Segmental and somatic dysfunction of lower extremity: Secondary | ICD-10-CM | POA: Diagnosis not present

## 2022-10-24 DIAGNOSIS — M791 Myalgia, unspecified site: Secondary | ICD-10-CM | POA: Diagnosis not present

## 2022-10-24 DIAGNOSIS — M9901 Segmental and somatic dysfunction of cervical region: Secondary | ICD-10-CM | POA: Diagnosis not present

## 2022-10-24 DIAGNOSIS — R748 Abnormal levels of other serum enzymes: Secondary | ICD-10-CM | POA: Diagnosis not present

## 2022-10-24 DIAGNOSIS — M5441 Lumbago with sciatica, right side: Secondary | ICD-10-CM | POA: Diagnosis not present

## 2022-10-24 DIAGNOSIS — E782 Mixed hyperlipidemia: Secondary | ICD-10-CM | POA: Diagnosis not present

## 2022-10-24 LAB — INSULIN, RANDOM: INSULIN: 15.5 u[IU]/mL (ref 2.6–24.9)

## 2022-10-24 LAB — HEMOGLOBIN A1C
Est. average glucose Bld gHb Est-mCnc: 128 mg/dL
Hgb A1c MFr Bld: 6.1 % — ABNORMAL HIGH (ref 4.8–5.6)

## 2022-10-25 LAB — COMPREHENSIVE METABOLIC PANEL
ALT: 20 IU/L (ref 0–32)
AST: 24 IU/L (ref 0–40)
Albumin: 3.9 g/dL (ref 3.8–4.8)
Alkaline Phosphatase: 77 IU/L (ref 44–121)
BUN/Creatinine Ratio: 13 (ref 12–28)
BUN: 12 mg/dL (ref 8–27)
Bilirubin Total: 0.3 mg/dL (ref 0.0–1.2)
CO2: 26 mmol/L (ref 20–29)
Calcium: 9.6 mg/dL (ref 8.7–10.3)
Chloride: 102 mmol/L (ref 96–106)
Creatinine, Ser: 0.89 mg/dL (ref 0.57–1.00)
Globulin, Total: 2.9 g/dL (ref 1.5–4.5)
Glucose: 83 mg/dL (ref 70–99)
Potassium: 3.8 mmol/L (ref 3.5–5.2)
Sodium: 142 mmol/L (ref 134–144)
Total Protein: 6.8 g/dL (ref 6.0–8.5)
eGFR: 67 mL/min/{1.73_m2} (ref >59)

## 2022-10-25 LAB — VITAMIN D 25 HYDROXY (VIT D DEFICIENCY, FRACTURES): Vit D, 25-Hydroxy: 64.2 ng/mL (ref 30.0–100.0)

## 2022-10-25 LAB — LIPID PANEL WITH LDL/HDL RATIO
Cholesterol, Total: 144 mg/dL (ref 100–199)
HDL: 47 mg/dL (ref >39)
LDL Chol Calc (NIH): 80 mg/dL (ref 0–99)
Triglycerides: 90 mg/dL (ref 0–149)
VLDL Cholesterol Cal: 17 mg/dL (ref 5–40)

## 2022-10-25 LAB — VITAMIN B12: Vitamin B-12: 1424 pg/mL — ABNORMAL HIGH (ref 232–1245)

## 2022-10-26 ENCOUNTER — Other Ambulatory Visit: Payer: Self-pay

## 2022-10-26 MED ORDER — OLMESARTAN MEDOXOMIL-HCTZ 20-12.5 MG PO TABS: 1.0000 | ORAL_TABLET | Freq: Every day | ORAL | 2 refills | Status: DC

## 2022-10-31 DIAGNOSIS — M9906 Segmental and somatic dysfunction of lower extremity: Secondary | ICD-10-CM | POA: Diagnosis not present

## 2022-10-31 DIAGNOSIS — M5441 Lumbago with sciatica, right side: Secondary | ICD-10-CM | POA: Diagnosis not present

## 2022-10-31 DIAGNOSIS — M9901 Segmental and somatic dysfunction of cervical region: Secondary | ICD-10-CM | POA: Diagnosis not present

## 2022-10-31 DIAGNOSIS — M9903 Segmental and somatic dysfunction of lumbar region: Secondary | ICD-10-CM | POA: Diagnosis not present

## 2022-11-07 DIAGNOSIS — M9901 Segmental and somatic dysfunction of cervical region: Secondary | ICD-10-CM | POA: Diagnosis not present

## 2022-11-07 DIAGNOSIS — M9906 Segmental and somatic dysfunction of lower extremity: Secondary | ICD-10-CM | POA: Diagnosis not present

## 2022-11-07 DIAGNOSIS — M5441 Lumbago with sciatica, right side: Secondary | ICD-10-CM | POA: Diagnosis not present

## 2022-11-07 DIAGNOSIS — M9903 Segmental and somatic dysfunction of lumbar region: Secondary | ICD-10-CM | POA: Diagnosis not present

## 2022-11-14 DIAGNOSIS — M9901 Segmental and somatic dysfunction of cervical region: Secondary | ICD-10-CM | POA: Diagnosis not present

## 2022-11-14 DIAGNOSIS — M9906 Segmental and somatic dysfunction of lower extremity: Secondary | ICD-10-CM | POA: Diagnosis not present

## 2022-11-14 DIAGNOSIS — M5441 Lumbago with sciatica, right side: Secondary | ICD-10-CM | POA: Diagnosis not present

## 2022-11-14 DIAGNOSIS — M9903 Segmental and somatic dysfunction of lumbar region: Secondary | ICD-10-CM | POA: Diagnosis not present

## 2022-11-22 ENCOUNTER — Ambulatory Visit (INDEPENDENT_AMBULATORY_CARE_PROVIDER_SITE_OTHER): Payer: Medicare HMO | Admitting: Family Medicine

## 2022-11-22 ENCOUNTER — Encounter (INDEPENDENT_AMBULATORY_CARE_PROVIDER_SITE_OTHER): Payer: Self-pay | Admitting: Family Medicine

## 2022-11-22 VITALS — BP 126/82 | HR 83 | Temp 98.2°F | Ht 60.0 in | Wt 148.0 lb

## 2022-11-22 DIAGNOSIS — E559 Vitamin D deficiency, unspecified: Secondary | ICD-10-CM | POA: Diagnosis not present

## 2022-11-22 DIAGNOSIS — Z6829 Body mass index (BMI) 29.0-29.9, adult: Secondary | ICD-10-CM

## 2022-11-22 DIAGNOSIS — Z6828 Body mass index (BMI) 28.0-28.9, adult: Secondary | ICD-10-CM | POA: Diagnosis not present

## 2022-11-22 DIAGNOSIS — E669 Obesity, unspecified: Secondary | ICD-10-CM

## 2022-11-22 DIAGNOSIS — R748 Abnormal levels of other serum enzymes: Secondary | ICD-10-CM

## 2022-11-22 DIAGNOSIS — E782 Mixed hyperlipidemia: Secondary | ICD-10-CM | POA: Diagnosis not present

## 2022-11-22 DIAGNOSIS — R7303 Prediabetes: Secondary | ICD-10-CM

## 2022-11-22 MED ORDER — FISH OIL 1200 MG PO CAPS
ORAL_CAPSULE | ORAL | Status: AC
Start: 2022-11-22 — End: ?

## 2022-11-22 MED ORDER — VITAMIN D (ERGOCALCIFEROL) 1.25 MG (50000 UNIT) PO CAPS
50000.0000 [IU] | ORAL_CAPSULE | ORAL | 0 refills | Status: DC
Start: 2022-11-22 — End: 2022-12-20

## 2022-11-22 NOTE — Progress Notes (Signed)
Sara Obrien, D.O.  ABFM, ABOM Specializing in Clinical Bariatric Medicine  Office located at: [address] [place], [place]  [address]     Assessment and Plan:   Medications Discontinued During This Encounter  Medication Reason   Omega-3 Fatty Acids (OMEGA 3 500 PO)    Vitamin D, Ergocalciferol, (DRISDOL) 1.25 MG (50000 UNIT) CAPS capsule Reorder     Meds ordered this encounter  Medications   Vitamin D, Ergocalciferol, (DRISDOL) 1.25 MG (50000 UNIT) CAPS capsule    Sig: Take 1 capsule (50,000 Units total) by mouth every 7 (seven) days.    Dispense:  4 capsule    Refill:  0   Omega-3 Fatty Acids (FISH OIL) 1200 MG CAPS    Sig: Take 3 tabs daily    Prediabetes Assessment & Plan: No current meds. Diet/exercise approach. Does report having sweet cravings when feeling down/depressed - pt had a few deaths in the family this year. Fasting insulin and A1c levels are trending upward. Kidney function, electrolytes, and liver enzymes are all within recommended limits.   Lab Results  Component Value Date   HGBA1C 6.1 (H) 10/23/2022   HGBA1C 5.9 (H) 06/12/2022   HGBA1C 5.9 (H) 01/19/2022   INSULIN 15.5 10/23/2022   INSULIN 11.8 06/12/2022   INSULIN 9.7 06/16/2019    Lab Results  Component Value Date   CREATININE 0.89 10/24/2022   BUN 12 10/24/2022   NA 142 10/24/2022   K 3.8 10/24/2022   CL 102 10/24/2022   CO2 26 10/24/2022      Component Value Date/Time   PROT 6.8 10/24/2022 1113   ALBUMIN 3.9 10/24/2022 1113   AST 24 10/24/2022 1113   ALT 20 10/24/2022 1113   ALKPHOS 77 10/24/2022 1113   BILITOT 0.3 10/24/2022 1113    We briefly discussed Metformin, which can help [place] in the management of this disease process as well as with weight loss. Handout on Metformin provided. Will consider starting Metformin in future as we will focus on prudent nutritional plan at this time. Recheck A1c and fasting insulin level in approximately 3 months from last  check.   Vitamin D deficiency Assessment & Plan: Her Vitamin D levels have improved from 28.9 to 64.2 - she is responding well to ERGO 50,000 units once a week.   Lab Results  Component Value Date   VD25OH 64.2 10/24/2022   VD25OH 28.9 (L) 06/12/2022   VD25OH 53.2 12/31/2019   C/w their weight loss efforts and ERGO at current dose - will refill this today. Will continue to monitor condition.    Mixed hyperlipidemia Assessment & Plan: Condition treated with Lipitor 20 mg daily. Pt was taking OTC Omega 3 Fish Oil 1200 mg 3 times daily, but decided to stop after labs on 06/12/22. HDL has dropped from 61 to 47. LDL has improved from 185 to 80. Total cholesterol has improved from 262 to 144.   Lab Results  Component Value Date   CHOL 144 10/24/2022   HDL 47 10/24/2022   LDLCALC 80 10/24/2022   TRIG 90 10/24/2022   CHOLHDL 3.6 01/19/2022   Restart OTC Fish Oil - 1200 mg, 3x daily. Continue with Lipitor per PCP and our treatment plan of a heart-heathy, low cholesterol meal plan We will continue routine screening as patient continues to achieve health goals along their weight loss journey.   Elevated vitamin B12 level Assessment & Plan: Pt is unsure of the OTC B12 dose she is taking. After  labs 5 months ago, she cut her dose in half, however levels are still elevated at 1424.   Lab Results  Component Value Date   VITAMINB12 1,424 (H) 10/24/2022   Take the half tab of OTC B12 every other day. We will continue to monitor as deemed clinically necessary.   TREATMENT PLAN FOR OBESITY: BMI 29.0-29.9,adult- current bmi 29.1 Obesity, starting bmi 30.97/date 06/12/22 Assessment & Plan: Sara Obrien is here to discuss her progress with her obesity treatment plan along with follow-up of her obesity related diagnoses. See Medical Weight Management Flowsheet for complete bioelectrical impedance results.  Since last office visit on 10/23/22 patient's muscle mass has increased by 2 lb. Fat  mass has decreased by 2.4 lb. Total body water has decreased by 2 lb.  Counseling done on how various foods will affect these numbers and how to maximize success  Total lbs lost to date: 10 lbs  Total weight loss percentage to date: 6.33%    No change to meal plan - see Subjective   - Counseling done on celebratory eating strategies for her Birthday on [birth year], such as freezing pound cake, etc.   Behavioral Intervention Additional resources provided today:  Metformin Handout  Evidence-based interventions for health behavior change were utilized today including the discussion of self monitoring techniques, problem-solving barriers and SMART goal setting techniques.   Regarding patient's less desirable eating habits and patterns, we employed the technique of small changes.  Pt will specifically work on: continue with prescribed meal plan for next visit.    FOLLOW UP: Return 4 wks. She was informed of the importance of frequent follow up visits to maximize her success with intensive lifestyle modifications for her multiple health conditions.  Subjective:   Chief complaint: Obesity Sara Obrien is here to discuss her progress with her obesity treatment plan. She is on the Category 1 Plan with B/L options and states she is following her eating plan approximately 50% of the time. She states she is walking 60 minutes 4 days per week and pickleball 180 minutes 4 x per week.   Interval History:  Sara Obrien is here for a follow up office visit. Since last OV, Sara Obrien has been doing okay. Endorses feeling down because of another death in her family. Has been doing some emotional eating. No complaints with meal plan today. Doing well with exercise regiment.   Review of Systems:  Pertinent positives were addressed with patient today.  Reviewed by clinician on day of visit: allergies, medications, problem list, medical history, surgical history, family history, social history, and previous encounter  notes.  Weight Summary and Biometrics   Weight Lost Since Last Visit: 1lb  Weight Gained Since Last Visit: 0lb   Vitals Temp: 98.2 F (36.8 C) BP: 126/82 Pulse Rate: 83 SpO2: 99 %   Anthropometric Measurements Height: 5' (1.524 m) Weight: 148 lb (67.1 kg) BMI (Calculated): 28.9 Weight at Last Visit: 149lb Weight Lost Since Last Visit: 1lb Weight Gained Since Last Visit: 0lb Starting Weight: 158lb Total Weight Loss (lbs): 10 lb (4.536 kg) Peak Weight: 190lb   Body Composition  Body Fat %: 38.6 % Fat Mass (lbs): 57.4 lbs Muscle Mass (lbs): 86.6 lbs Total Body Water (lbs): 59.2 lbs Visceral Fat Rating : 11   Other Clinical Data Fasting: no Labs: no Today's Visit #: 9 Starting Date: 06/12/22   Objective:   PHYSICAL EXAM: Blood pressure 126/82, pulse 83, temperature 98.2 F (36.8 C), height 5' (1.524 m), weight 148 lb (67.1  kg), SpO2 99%. Body mass index is 28.9 kg/m.  General: Well Developed, well nourished, and in no acute distress.  HEENT: Normocephalic, atraumatic Skin: Warm and dry, cap RF less 2 sec, good turgor Chest:  Normal excursion, shape, no gross abn Respiratory: speaking in full sentences, no conversational dyspnea NeuroM-Sk: Ambulates w/o assistance, moves * 4 Psych: A and O *3, insight good, mood-full  DIAGNOSTIC DATA REVIEWED:  BMET    Component Value Date/Time   NA 142 10/24/2022 1113   K 3.8 10/24/2022 1113   CL 102 10/24/2022 1113   CO2 26 10/24/2022 1113   GLUCOSE 83 10/24/2022 1113   GLUCOSE 99 11/08/2010 1100   BUN 12 10/24/2022 1113   CREATININE 0.89 10/24/2022 1113   CALCIUM 9.6 10/24/2022 1113   GFRNONAA 63 12/31/2019 1429   GFRAA 73 12/31/2019 1429   Lab Results  Component Value Date   HGBA1C 6.1 (H) 10/23/2022   HGBA1C 5.6 12/17/2018   Lab Results  Component Value Date   INSULIN 15.5 10/23/2022   INSULIN 9.7 06/16/2019   Lab Results  Component Value Date   TSH 0.724 06/12/2022   CBC    Component Value  Date/Time   WBC 4.3 06/12/2022 1227   WBC 12.7 (H) 08/15/2008 1804   RBC 4.60 06/12/2022 1227   RBC 4.31 08/15/2008 1804   HGB 13.2 06/12/2022 1227   HCT 40.8 06/12/2022 1227   PLT 372 06/12/2022 1227   MCV 89 06/12/2022 1227   MCH 28.7 06/12/2022 1227   MCHC 32.4 06/12/2022 1227   MCHC 34.4 08/15/2008 1804   RDW 14.0 06/12/2022 1227   Iron Studies No results found for: "IRON", "TIBC", "FERRITIN", "IRONPCTSAT" Lipid Panel     Component Value Date/Time   CHOL 144 10/24/2022 1113   TRIG 90 10/24/2022 1113   HDL 47 10/24/2022 1113   CHOLHDL 3.6 01/19/2022 1540   LDLCALC 80 10/24/2022 1113   Hepatic Function Panel     Component Value Date/Time   PROT 6.8 10/24/2022 1113   ALBUMIN 3.9 10/24/2022 1113   AST 24 10/24/2022 1113   ALT 20 10/24/2022 1113   ALKPHOS 77 10/24/2022 1113   BILITOT 0.3 10/24/2022 1113      Component Value Date/Time   TSH 0.724 06/12/2022 1227   Nutritional Lab Results  Component Value Date   VD25OH 64.2 10/24/2022   VD25OH 28.9 (L) 06/12/2022   VD25OH 53.2 12/31/2019    Attestations:   I, Special Puri, acting as a Stage manager for Thomasene Lot, DO., have compiled all relevant documentation for today's office visit on behalf of Thomasene Lot, DO, while in the presence of [employer], DO.  I have reviewed the above documentation for accuracy and completeness, and I agree with the above. Sara Obrien, D.O.  The 21st Century Cures Act was signed into law in 2016 which includes the topic of electronic health records.  This provides immediate access to information in MyChart.  This includes consultation notes, operative notes, office notes, lab results and pathology reports.  If you have any questions about what you read please let us know at your next visit so we can discuss your concerns and take corrective action if need be.  We are right here with you.

## 2022-12-05 ENCOUNTER — Other Ambulatory Visit: Payer: Self-pay | Admitting: Internal Medicine

## 2022-12-13 DIAGNOSIS — Z1231 Encounter for screening mammogram for malignant neoplasm of breast: Secondary | ICD-10-CM | POA: Diagnosis not present

## 2022-12-13 LAB — HM MAMMOGRAPHY

## 2022-12-19 DIAGNOSIS — M9906 Segmental and somatic dysfunction of lower extremity: Secondary | ICD-10-CM | POA: Diagnosis not present

## 2022-12-19 DIAGNOSIS — M5441 Lumbago with sciatica, right side: Secondary | ICD-10-CM | POA: Diagnosis not present

## 2022-12-19 DIAGNOSIS — M9901 Segmental and somatic dysfunction of cervical region: Secondary | ICD-10-CM | POA: Diagnosis not present

## 2022-12-19 DIAGNOSIS — M9903 Segmental and somatic dysfunction of lumbar region: Secondary | ICD-10-CM | POA: Diagnosis not present

## 2022-12-20 ENCOUNTER — Ambulatory Visit (INDEPENDENT_AMBULATORY_CARE_PROVIDER_SITE_OTHER): Payer: Medicare HMO | Admitting: Family Medicine

## 2022-12-20 ENCOUNTER — Encounter (INDEPENDENT_AMBULATORY_CARE_PROVIDER_SITE_OTHER): Payer: Self-pay | Admitting: Family Medicine

## 2022-12-20 VITALS — BP 106/70 | HR 81 | Temp 98.1°F | Ht 60.0 in | Wt 150.0 lb

## 2022-12-20 DIAGNOSIS — F39 Unspecified mood [affective] disorder: Secondary | ICD-10-CM | POA: Diagnosis not present

## 2022-12-20 DIAGNOSIS — E559 Vitamin D deficiency, unspecified: Secondary | ICD-10-CM | POA: Diagnosis not present

## 2022-12-20 DIAGNOSIS — E669 Obesity, unspecified: Secondary | ICD-10-CM | POA: Diagnosis not present

## 2022-12-20 DIAGNOSIS — Z6829 Body mass index (BMI) 29.0-29.9, adult: Secondary | ICD-10-CM | POA: Diagnosis not present

## 2022-12-20 MED ORDER — BUPROPION HCL ER (SR) 100 MG PO TB12
ORAL_TABLET | ORAL | 0 refills | Status: DC
Start: 2022-12-20 — End: 2023-01-31

## 2022-12-20 MED ORDER — VITAMIN D (ERGOCALCIFEROL) 1.25 MG (50000 UNIT) PO CAPS
50000.0000 [IU] | ORAL_CAPSULE | ORAL | 0 refills | Status: DC
Start: 2022-12-20 — End: 2023-01-31

## 2022-12-20 NOTE — Progress Notes (Signed)
Sara Obrien, D.O.  ABFM, ABOM Specializing in Clinical Bariatric Medicine  Office located at: [address] [place], [place]  [address]     Assessment and Plan:   Medications Discontinued During This Encounter  Medication Reason   Vitamin D, Ergocalciferol, (DRISDOL) 1.25 MG (50000 UNIT) CAPS capsule Reorder     Meds ordered this encounter  Medications   Vitamin D, Ergocalciferol, (DRISDOL) 1.25 MG (50000 UNIT) CAPS capsule    Sig: Take 1 capsule (50,000 Units total) by mouth every 7 (seven) days.    Dispense:  8 capsule    Refill:  0   buPROPion ER (WELLBUTRIN SR) 100 MG 12 hr tablet    Sig: 1po q am with food    Dispense:  60 tablet    Refill:  0     Mood disorder (HCC)- emotional eating Assessment & Plan: Current medication: Lexapro 20 mg daily. No SI/HI. Pt endorses that she has not been the same since June. There have been deaths in her family, she is worried about her husbands health, and has been thinking about her own mortality. Reports not feeling motivated to do things, such as playing pickleball. Pt has not yet explored mindfulness meditation through the calm or head space app.   Recommended that pt discuss her feelings with a personal counselor (e.g Sara Obrien), her pastor, and husband. We also discussed volunteering at elderly homes, [employer], & the maternity ward. Also, we discussed the anticipated benefits, possible risks of Wellbutrin which can help [place] in the management of her mood. Pt instructed to begin Wellbutrin 100 mg once daily. In the near future, we may decrease her Lexapro as it can be counterproductive to weight loss.    Vitamin D deficiency Assessment & Plan: Lab Results  Component Value Date   VD25OH 64.2 10/24/2022   VD25OH 28.9 (L) 06/12/2022   VD25OH 53.2 12/31/2019   Vit D deficiency treated with ERGO 50K units once wkly. Continue with their weight loss efforts and high dose vit D - Will refill ERGO today. Will recheck levels in  1-2 mos.   BMI 29.0-29.9,adult- current bmi 29.3 Obesity, starting bmi 30.97/date 06/12/22 Assessment & Plan: Since last office visit on 11/22/22 patient's muscle mass has increased by 1 lb. Fat mass has increased by 0.6 lb. Total body water has increased by 0.2 lb.  Counseling done on how various foods will affect these numbers and how to maximize success  Total lbs lost to date: 8 lbs  Total weight loss percentage to date: 5.06%   No change to meal plan - see Subjective   Behavioral Intervention Additional resources provided today: NA Evidence-based interventions for health behavior change were utilized today including the discussion of self monitoring techniques, problem-solving barriers and SMART goal setting techniques.   Regarding patient's less desirable eating habits and patterns, we employed the technique of small changes.  Pt will specifically work on: finding a new spiritual home & restarting pickleball for next visit.    FOLLOW UP: Return in about 4 weeks (around 01/17/2023). She was informed of the importance of frequent follow up visits to maximize her success with intensive lifestyle modifications for her multiple health conditions.  Subjective:   Chief complaint: Obesity Sara Obrien is here to discuss her progress with her obesity treatment plan. She is on the Category 1 Plan with B/L options and states she is following her eating plan approximately 10% of the time. She states she is walking 60 minutes 2 days  per week.   Interval History:  Sara Obrien is here for a follow up office visit. Pt endorses that she has not been the same since Oct 17, 2022. There have been a few deaths in her family, she is worried about her husbands health, and has been thinking about her own mortality. Reports not feeling motivated to do things, such as playing pickleball. Food wise, she is not following her meal plan.   Review of Systems:  Pertinent positives were addressed with patient  today.  Reviewed by clinician on day of visit: allergies, medications, problem list, medical history, surgical history, family history, social history, and previous encounter notes.  Weight Summary and Biometrics   Weight Lost Since Last Visit: 0lb  Weight Gained Since Last Visit: 2lb   Vitals Temp: 98.1 F (36.7 C) BP: 106/70 Pulse Rate: 81 SpO2: 99 %   Anthropometric Measurements Height: 5' (1.524 m) Weight: 150 lb (68 kg) BMI (Calculated): 29.3 Weight at Last Visit: 148lb Weight Lost Since Last Visit: 0lb Weight Gained Since Last Visit: 2lb Starting Weight: 158lb Total Weight Loss (lbs): 8 lb (3.629 kg) Peak Weight: 190lb   Body Composition  Body Fat %: 38.6 % Fat Mass (lbs): 58 lbs Muscle Mass (lbs): 87.6 lbs Total Body Water (lbs): 39.4 lbs Visceral Fat Rating : 12   Other Clinical Data Fasting: no Labs: no Today's Visit #: 10 Starting Date: 06/12/22   Objective:   PHYSICAL EXAM: Blood pressure 106/70, pulse 81, temperature 98.1 F (36.7 C), height 5' (1.524 m), weight 150 lb (68 kg), SpO2 99%. Body mass index is 29.29 kg/m.  General: Well Developed, well nourished, and in no acute distress.  HEENT: Normocephalic, atraumatic Skin: Warm and dry, cap RF less 2 sec, good turgor Chest:  Normal excursion, shape, no gross abn Respiratory: speaking in full sentences, no conversational dyspnea NeuroM-Sk: Ambulates w/o assistance, moves * 4 Psych: A and O *3, insight good, mood-full  DIAGNOSTIC DATA REVIEWED:  BMET    Component Value Date/Time   NA 142 10/24/2022 1113   K 3.8 10/24/2022 1113   CL 102 10/24/2022 1113   CO2 26 10/24/2022 1113   GLUCOSE 83 10/24/2022 1113   GLUCOSE 99 11/08/2010 1100   BUN 12 10/24/2022 1113   CREATININE 0.89 10/24/2022 1113   CALCIUM 9.6 10/24/2022 1113   GFRNONAA 63 12/31/2019 1429   GFRAA 73 12/31/2019 1429   Lab Results  Component Value Date   HGBA1C 6.1 (H) 10/23/2022   HGBA1C 5.6 12/17/2018   Lab  Results  Component Value Date   INSULIN 15.5 10/23/2022   INSULIN 9.7 06/16/2019   Lab Results  Component Value Date   TSH 0.724 06/12/2022   CBC    Component Value Date/Time   WBC 4.3 06/12/2022 1227   WBC 12.7 (H) 08/15/2008 1804   RBC 4.60 06/12/2022 1227   RBC 4.31 08/15/2008 1804   HGB 13.2 06/12/2022 1227   HCT 40.8 06/12/2022 1227   PLT 372 06/12/2022 1227   MCV 89 06/12/2022 1227   MCH 28.7 06/12/2022 1227   MCHC 32.4 06/12/2022 1227   MCHC 34.4 08/15/2008 1804   RDW 14.0 06/12/2022 1227   Iron Studies No results found for: "IRON", "TIBC", "FERRITIN", "IRONPCTSAT" Lipid Panel     Component Value Date/Time   CHOL 144 10/24/2022 1113   TRIG 90 10/24/2022 1113   HDL 47 10/24/2022 1113   CHOLHDL 3.6 01/19/2022 1540   LDLCALC 80 10/24/2022 1113   Hepatic Function Panel  Component Value Date/Time   PROT 6.8 10/24/2022 1113   ALBUMIN 3.9 10/24/2022 1113   AST 24 10/24/2022 1113   ALT 20 10/24/2022 1113   ALKPHOS 77 10/24/2022 1113   BILITOT 0.3 10/24/2022 1113      Component Value Date/Time   TSH 0.724 06/12/2022 1227   Nutritional Lab Results  Component Value Date   VD25OH 64.2 10/24/2022   VD25OH 28.9 (L) 06/12/2022   VD25OH 53.2 12/31/2019    Attestations:   I, Special Puri, acting as a Stage manager for Thomasene Lot, DO., have compiled all relevant documentation for today's office visit on behalf of Thomasene Lot, DO, while in the presence of [employer], DO.  I have reviewed the above documentation for accuracy and completeness, and I agree with the above. Sara Obrien, D.O.  The 21st Century Cures Act was signed into law in 2016 which includes the topic of electronic health records.  This provides immediate access to information in MyChart.  This includes consultation notes, operative notes, office notes, lab results and pathology reports.  If you have any questions about what you read please let us know at your next visit so we  can discuss your concerns and take corrective action if need be.  We are right here with you.

## 2022-12-26 DIAGNOSIS — M5441 Lumbago with sciatica, right side: Secondary | ICD-10-CM | POA: Diagnosis not present

## 2022-12-26 DIAGNOSIS — M9906 Segmental and somatic dysfunction of lower extremity: Secondary | ICD-10-CM | POA: Diagnosis not present

## 2022-12-26 DIAGNOSIS — M9903 Segmental and somatic dysfunction of lumbar region: Secondary | ICD-10-CM | POA: Diagnosis not present

## 2022-12-26 DIAGNOSIS — M9901 Segmental and somatic dysfunction of cervical region: Secondary | ICD-10-CM | POA: Diagnosis not present

## 2023-01-02 DIAGNOSIS — M9906 Segmental and somatic dysfunction of lower extremity: Secondary | ICD-10-CM | POA: Diagnosis not present

## 2023-01-02 DIAGNOSIS — M9903 Segmental and somatic dysfunction of lumbar region: Secondary | ICD-10-CM | POA: Diagnosis not present

## 2023-01-02 DIAGNOSIS — M5441 Lumbago with sciatica, right side: Secondary | ICD-10-CM | POA: Diagnosis not present

## 2023-01-02 DIAGNOSIS — M9901 Segmental and somatic dysfunction of cervical region: Secondary | ICD-10-CM | POA: Diagnosis not present

## 2023-01-09 DIAGNOSIS — M5441 Lumbago with sciatica, right side: Secondary | ICD-10-CM | POA: Diagnosis not present

## 2023-01-09 DIAGNOSIS — M9903 Segmental and somatic dysfunction of lumbar region: Secondary | ICD-10-CM | POA: Diagnosis not present

## 2023-01-09 DIAGNOSIS — M9906 Segmental and somatic dysfunction of lower extremity: Secondary | ICD-10-CM | POA: Diagnosis not present

## 2023-01-09 DIAGNOSIS — M9901 Segmental and somatic dysfunction of cervical region: Secondary | ICD-10-CM | POA: Diagnosis not present

## 2023-01-10 ENCOUNTER — Ambulatory Visit: Payer: Medicare HMO

## 2023-01-10 VITALS — BP 126/64 | HR 77 | Temp 98.4°F | Ht 60.8 in | Wt 154.0 lb

## 2023-01-10 DIAGNOSIS — Z0000 Encounter for general adult medical examination without abnormal findings: Secondary | ICD-10-CM

## 2023-01-10 NOTE — Progress Notes (Signed)
Subjective:   Sara Obrien is a 77 y.o. female who presents for Medicare Annual (Subsequent) preventive examination.  Visit Complete: In person    Cardiac Risk Factors include: advanced age (>75men, >29 women);dyslipidemia;hypertension     Objective:    Today's Vitals   01/10/23 1218 01/10/23 1219  BP: 126/64   Pulse: 77   Temp: 98.4 F (36.9 C)   TempSrc: Oral   SpO2: 97%   Weight: 154 lb (69.9 kg)   Height: 5' 0.8" (1.544 m)   PainSc:  5    Body mass index is 29.29 kg/m.     01/10/2023   12:26 PM 12/30/2021    2:56 PM 12/29/2020    8:41 AM 12/31/2019   11:23 AM 12/16/2018    2:57 PM  Advanced Directives  Does Patient Have a Medical Advance Directive? No Yes Yes No No  Type of Advance Directive   Living will    Does patient want to make changes to medical advance directive?  Yes (ED - Information included in AVS)     Would patient like information on creating a medical advance directive?    No - Patient declined     Current Medications (verified) Outpatient Encounter Medications as of 01/10/2023  Medication Sig   aspirin EC 81 MG tablet Take 81 mg by mouth daily. Swallow whole.   atorvastatin (LIPITOR) 20 MG tablet TAKE 1 TABLET EVERY DAY   buPROPion ER (WELLBUTRIN SR) 100 MG 12 hr tablet 1po q am with food   diphenhydramine-acetaminophen (TYLENOL PM) 25-500 MG TABS tablet Take 1 tablet by mouth at bedtime as needed.   escitalopram (LEXAPRO) 20 MG tablet TAKE 1 TABLET (20 MG TOTAL) BY MOUTH DAILY.   loratadine (CLARITIN) 10 MG tablet Take 10 mg by mouth daily.   olmesartan-hydrochlorothiazide (BENICAR HCT) 20-12.5 MG tablet TAKE 1 TABLET BY MOUTH DAILY   Omega-3 Fatty Acids (FISH OIL) 1200 MG CAPS Take 3 tabs daily   Vitamin D, Ergocalciferol, (DRISDOL) 1.25 MG (50000 UNIT) CAPS capsule Take 1 capsule (50,000 Units total) by mouth every 7 (seven) days.   cetirizine (ZYRTEC) 10 MG chewable tablet Chew 10 mg by mouth daily. (Patient not taking: Reported on  01/10/2023)   No facility-administered encounter medications on file as of 01/10/2023.    Allergies (verified) Patient has no known allergies.   History: Past Medical History:  Diagnosis Date   Anxiety    Atypical chest pain 07/01/2017   Constipation    Depression    Essential hypertension 07/01/2017   Gum disease    dentures worn   History of swelling of feet    Hyperlipidemia    Hypertension    Knee pain    Low back pain    Migraines    Overweight    Past Surgical History:  Procedure Laterality Date   BREAST LUMPECTOMY     TUBAL LIGATION     Family History  Problem Relation Age of Onset   Brain cancer Mother    Hypertension Mother    Diabetes Mother    High Cholesterol Mother    Heart disease Mother    Stroke Mother    Depression Mother    Stroke Father    Hypertension Father    Heart disease Father    Sudden death Father    Hypertension Sister    Breast cancer Sister    Cancer Brother    Cancer Brother    Cancer Brother    Social History  Socioeconomic History   Marital status: Married    Spouse name: Not on file   Number of children: Not on file   Years of education: Not on file   Highest education level: Not on file  Occupational History   Occupation: semi- retired  Tobacco Use   Smoking status: Never   Smokeless tobacco: Never  Vaping Use   Vaping status: Never Used  Substance and Sexual Activity   Alcohol use: Yes    Comment: glass of wine   Drug use: No   Sexual activity: Not Currently    Birth control/protection: Abstinence  Other Topics Concern   Not on file  Social History Narrative   Not on file   Social Determinants of Health   Financial Resource Strain: Low Risk  (01/10/2023)   Overall Financial Resource Strain (CARDIA)    Difficulty of Paying Living Expenses: Not hard at all  Food Insecurity: No Food Insecurity (01/10/2023)   Hunger Vital Sign    Worried About Running Out of Food in the Last Year: Never true    Ran  Out of Food in the Last Year: Never true  Transportation Needs: No Transportation Needs (01/10/2023)   PRAPARE - Administrator, Civil Service (Medical): No    Lack of Transportation (Non-Medical): No  Physical Activity: Sufficiently Active (01/10/2023)   Exercise Vital Sign    Days of Exercise per Week: 5 days    Minutes of Exercise per Session: 150+ min  Stress: No Stress Concern Present (01/10/2023)   [employer] of Occupational Health - Occupational Stress Questionnaire    Feeling of Stress : Only a little  Social Connections: Socially Integrated (01/10/2023)   Social Connection and Isolation Panel [NHANES]    Frequency of Communication with Friends and Family: More than three times a week    Frequency of Social Gatherings with Friends and Family: Three times a week    Attends Religious Services: More than 4 times per year    Active Member of Clubs or Organizations: Yes    Attends Engineer, structural: More than 4 times per year    Marital Status: Married    Tobacco Counseling Counseling given: Not Answered   Clinical Intake:  Pre-visit preparation completed: Yes  Pain : 0-10 Pain Score: 5  Pain Type: Acute pain Pain Location: Hand Pain Orientation: Left Pain Descriptors / Indicators: Discomfort Pain Onset: More than a month ago Pain Frequency: Intermittent     Nutritional Status: BMI 25 -29 Overweight Nutritional Risks: None Diabetes: No  How often do you need to have someone help you when you read instructions, pamphlets, or other written materials from your doctor or pharmacy?: 1 - Never  Interpreter Needed?: No  Information entered by :: NAllen LPN   Activities of Daily Living    01/10/2023   12:21 PM  In your present state of health, do you have any difficulty performing the following activities:  Hearing? 0  Vision? 0  Difficulty concentrating or making decisions? 0  Walking or climbing stairs? 0  Dressing or bathing? 0   Doing errands, shopping? 0  Preparing Food and eating ? N  Using the Toilet? N  In the past six months, have you accidently leaked urine? N  Do you have problems with loss of bowel control? N  Managing your Medications? N  Managing your Finances? N  Housekeeping or managing your Housekeeping? N    Patient Care Team: Dorothyann Peng, MD as PCP - General (  Internal Medicine)  Indicate any recent Medical Services you may have received from other than [facility] providers in the past year (date may be approximate).     Assessment:   This is a routine wellness examination for [facility].  Hearing/Vision screen Hearing Screening - Comments:: Denies hearing issues Vision Screening - Comments:: Regular eye exams, [facility]   Goals Addressed             This Visit's Progress    Patient Stated       01/10/2023, wants to lose weight       Depression Screen    01/10/2023   12:28 PM 07/23/2022    9:14 AM 01/19/2022    2:18 PM 12/30/2021    2:56 PM 12/30/2021    2:55 PM 08/08/2021   10:18 AM 12/29/2020    8:42 AM  PHQ 2/9 Scores  PHQ - 2 Score 0 0 0 0 0 3 0  PHQ- 9 Score 0 0 0   15     Fall Risk    01/10/2023   12:27 PM 07/23/2022    9:14 AM 12/30/2021    2:56 PM 12/29/2020    8:42 AM 12/31/2019   11:24 AM  Fall Risk   Falls in the past year? 0 0 0 0 0  Number falls in past yr: 0 0 0    Injury with Fall? 0 0 0    Risk for fall due to : Medication side effect No Fall Risks No Fall Risks Medication side effect Medication side effect  Follow up Falls prevention discussed;Falls evaluation completed Falls evaluation completed Falls evaluation completed Falls evaluation completed;Education provided;Falls prevention discussed Falls evaluation completed;Education provided;Falls prevention discussed    MEDICARE RISK AT HOME: Medicare Risk at Home Any stairs in or around the home?: Yes If so, are there any without handrails?: No Home free of loose throw rugs in walkways, pet beds,  electrical cords, etc?: Yes Adequate lighting in your home to reduce risk of falls?: Yes Life alert?: No Use of a cane, walker or w/c?: No Grab bars in the bathroom?: No Shower chair or bench in shower?: Yes Elevated toilet seat or a handicapped toilet?: Yes  TIMED UP AND GO:  Was the test performed?  Yes  Length of time to ambulate 10 feet: 5 sec Gait steady and fast without use of assistive device    Cognitive Function:        01/10/2023   12:28 PM 12/30/2021    2:57 PM 12/29/2020    8:43 AM 12/31/2019   11:25 AM 12/16/2018    3:05 PM  6CIT Screen  What Year? 0 points 0 points 0 points 0 points 0 points  What month? 0 points 0 points 0 points 0 points 0 points  What time? 0 points 0 points 0 points 0 points 0 points  Count back from 20 0 points 0 points 0 points 0 points 0 points  Months in reverse 0 points 0 points 0 points 0 points 0 points  Repeat phrase 2 points 0 points 2 points 2 points 0 points  Total Score 2 points 0 points 2 points 2 points 0 points    Immunizations Immunization History  Administered Date(s) Administered   Fluad Quad(high Dose 65+) 12/10/2019, 12/28/2022   Influenza, High Dose Seasonal PF 12/17/2018, 12/13/2020   Influenza-Unspecified 12/01/2020, 12/18/2021   PFIZER(Purple Top)SARS-COV-2 Vaccination 05/08/2019, 06/02/2019, 02/13/2020, 07/22/2020   PNEUMOCOCCAL CONJUGATE-20 08/08/2021   Pfizer Covid-19 Vaccine Bivalent Booster 5yrs &  up 03/20/2021   Pfizer(Comirnaty)Fall Seasonal Vaccine 12 years and older 03/20/2022   Tdap 09/22/2013   Zoster Recombinant(Shingrix) 01/19/2022   Zoster, Live 07/01/2016    TDAP status: Up to date  Flu Vaccine status: Up to date  Pneumococcal vaccine status: Up to date  Covid-19 vaccine status: Completed vaccines  Qualifies for Shingles Vaccine? Yes   Zostavax completed Yes   Shingrix Completed?: needs second dose  Screening Tests Health Maintenance  Topic Date Due   MAMMOGRAM  12/07/2021   Zoster  Vaccines- Shingrix (2 of 2) 03/16/2022   COVID-19 Vaccine (7 - 2023-24 season) 12/02/2022   DTaP/Tdap/Td (2 - Td or Tdap) 09/23/2023   Medicare Annual Wellness (AWV)  01/10/2024   Pneumonia Vaccine 2+ Years old  Completed   INFLUENZA VACCINE  Completed   DEXA SCAN  Completed   Hepatitis C Screening  Completed   HPV VACCINES  Aged Out   Colonoscopy  Discontinued    Health Maintenance  Health Maintenance Due  Topic Date Due   MAMMOGRAM  12/07/2021   Zoster Vaccines- Shingrix (2 of 2) 03/16/2022   COVID-19 Vaccine (7 - 2023-24 season) 12/02/2022    Colorectal cancer screening: No longer required.   Mammogram status: Completed 12/2022. Repeat every year  Bone Density status: Completed 12/15/2020.   Lung Cancer Screening: (Low Dose CT Chest recommended if Age 74-80 years, 20 pack-year currently smoking OR have quit w/in 15years.) does not qualify.   Lung Cancer Screening Referral: no  Additional Screening:  Hepatitis C Screening: does qualify; Completed 08/03/2012  Vision Screening: Recommended annual ophthalmology exams for early detection of glaucoma and other disorders of the eye. Is the patient up to date with their annual eye exam?  Yes  Who is the provider or what is the name of the office in which the patient attends annual eye exams? [facility] If pt is not established with a provider, would they like to be referred to a provider to establish care? No .   Dental Screening: Recommended annual dental exams for proper oral hygiene  Diabetic Foot Exam: n/a  Community Resource Referral / Chronic Care Management: CRR required this visit?  No   CCM required this visit?  No     Plan:     I have personally reviewed and noted the following in the patient's chart:   Medical and social history Use of alcohol, tobacco or illicit drugs  Current medications and supplements including opioid prescriptions. Patient is not currently taking opioid prescriptions. Functional  ability and status Nutritional status Physical activity Advanced directives List of other physicians Hospitalizations, surgeries, and ER visits in previous 12 months Vitals Screenings to include cognitive, depression, and falls Referrals and appointments  In addition, I have reviewed and discussed with patient certain preventive protocols, quality metrics, and best practice recommendations. A written personalized care plan for preventive services as well as general preventive health recommendations were provided to patient.     Barb Merino, LPN   16/01/9603   After Visit Summary: (In Person-Printed) AVS printed and given to the patient  Nurse Notes: none

## 2023-01-10 NOTE — Patient Instructions (Signed)
[name] , Thank you for taking time to come for your Medicare Wellness Visit. I appreciate your ongoing commitment to your health goals. Please review the following plan we discussed and let me know if I can assist you in the future.   Referrals/Orders/Follow-Ups/Clinician Recommendations: none  This is a list of the screening recommended for you and due dates:  Health Maintenance  Topic Date Due   Mammogram  12/07/2021   Zoster (Shingles) Vaccine (2 of 2) 03/16/2022   COVID-19 Vaccine (7 - 2023-24 season) 12/02/2022   DTaP/Tdap/Td vaccine (2 - Td or Tdap) 09/23/2023   Medicare Annual Wellness Visit  01/10/2024   Pneumonia Vaccine  Completed   Flu Shot  Completed   DEXA scan (bone density measurement)  Completed   Hepatitis C Screening  Completed   HPV Vaccine  Aged Out   Colon Cancer Screening  Discontinued    Advanced directives: (Declined) Advance directive discussed with you today. Even though you declined this today, please call our office should you change your mind, and we can give you the proper paperwork for you to fill out.  Next Medicare Annual Wellness Visit scheduled for next year: No, office will schedule  Insert Preventive Care attachment Insert FALL PREVENTION attachment if needed

## 2023-01-16 ENCOUNTER — Encounter: Payer: Self-pay | Admitting: Internal Medicine

## 2023-01-17 ENCOUNTER — Ambulatory Visit (INDEPENDENT_AMBULATORY_CARE_PROVIDER_SITE_OTHER): Payer: Medicare HMO | Admitting: Family Medicine

## 2023-01-23 DIAGNOSIS — M9906 Segmental and somatic dysfunction of lower extremity: Secondary | ICD-10-CM | POA: Diagnosis not present

## 2023-01-23 DIAGNOSIS — M5441 Lumbago with sciatica, right side: Secondary | ICD-10-CM | POA: Diagnosis not present

## 2023-01-23 DIAGNOSIS — M9901 Segmental and somatic dysfunction of cervical region: Secondary | ICD-10-CM | POA: Diagnosis not present

## 2023-01-23 DIAGNOSIS — M9903 Segmental and somatic dysfunction of lumbar region: Secondary | ICD-10-CM | POA: Diagnosis not present

## 2023-01-28 ENCOUNTER — Ambulatory Visit (INDEPENDENT_AMBULATORY_CARE_PROVIDER_SITE_OTHER): Payer: Medicare HMO | Admitting: Internal Medicine

## 2023-01-28 ENCOUNTER — Encounter: Payer: Self-pay | Admitting: Internal Medicine

## 2023-01-28 VITALS — BP 102/70 | HR 68 | Temp 98.4°F | Ht 60.8 in | Wt 149.0 lb

## 2023-01-28 DIAGNOSIS — M79642 Pain in left hand: Secondary | ICD-10-CM | POA: Diagnosis not present

## 2023-01-28 DIAGNOSIS — N182 Chronic kidney disease, stage 2 (mild): Secondary | ICD-10-CM

## 2023-01-28 DIAGNOSIS — Z0000 Encounter for general adult medical examination without abnormal findings: Secondary | ICD-10-CM | POA: Insufficient documentation

## 2023-01-28 DIAGNOSIS — M79641 Pain in right hand: Secondary | ICD-10-CM | POA: Diagnosis not present

## 2023-01-28 DIAGNOSIS — Z23 Encounter for immunization: Secondary | ICD-10-CM | POA: Diagnosis not present

## 2023-01-28 DIAGNOSIS — I129 Hypertensive chronic kidney disease with stage 1 through stage 4 chronic kidney disease, or unspecified chronic kidney disease: Secondary | ICD-10-CM

## 2023-01-28 DIAGNOSIS — R7309 Other abnormal glucose: Secondary | ICD-10-CM

## 2023-01-28 DIAGNOSIS — R0789 Other chest pain: Secondary | ICD-10-CM | POA: Diagnosis not present

## 2023-01-28 LAB — POCT URINALYSIS DIPSTICK
Bilirubin, UA: NEGATIVE
Glucose, UA: NEGATIVE
Ketones, UA: NEGATIVE
Nitrite, UA: NEGATIVE
Protein, UA: NEGATIVE
Spec Grav, UA: 1.025 (ref 1.010–1.025)
Urobilinogen, UA: 0.2 U/dL
pH, UA: 6 (ref 5.0–8.0)

## 2023-01-28 MED ORDER — ZOSTER VAC RECOMB ADJUVANTED 50 MCG/0.5ML IM SUSR
0.5000 mL | Freq: Once | INTRAMUSCULAR | 0 refills | Status: AC
Start: 2023-01-28 — End: 2023-01-28

## 2023-01-28 NOTE — Assessment & Plan Note (Signed)
Chronic, she is encouraged to keep BP well controlled and to stay well hydrated to decrease risk of CKD progression.

## 2023-01-28 NOTE — Assessment & Plan Note (Signed)
Pt advised this is musculoskeletal. I did pick up her handbag during her visit, this was quite heavy. She does admit to carrying this on her left shoulder. She is advised to remove some items that are not needed from her purse. She will let me know if her sx persist. (Of note, she also plays pickleball which could be exacerbating her sx).

## 2023-01-28 NOTE — Assessment & Plan Note (Signed)
I will check arthritis panel. She may take Tylenol prn. 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

## 2023-01-28 NOTE — Assessment & Plan Note (Signed)
Previous labs reviewed, her A1c has been elevated in the past. I will check an A1c today. Reminded to avoid refined sugars including sugary drinks/foods and processed meats including bacon, sausages and deli meats.  

## 2023-01-28 NOTE — Assessment & Plan Note (Addendum)
Chronic, well controlled.  EKG performed, NSR w/ negative T waves and possible anterior  ischemia - no new changes. She will continue with olmesartan/hct 20/12.5mg  daily. She is reminded to follow a low sodium diet.

## 2023-01-28 NOTE — Assessment & Plan Note (Signed)

## 2023-01-28 NOTE — Progress Notes (Signed)
I,Jameka J Llittleton, CMA,acting as a Neurosurgeon for Gwynneth Aliment, MD.,have documented all relevant documentation on the behalf of Gwynneth Aliment, MD,as directed by  Gwynneth Aliment, MD while in the presence of Gwynneth Aliment, MD.  Subjective:    Patient ID: Sara Obrien , female    DOB: 09/26/1945 , 77 y.o.   MRN: [id number]  Chief Complaint  Patient presents with   Annual Exam   Hypertension    HPI  Patient here for HM. She reports compliance with meds. She denies headaches, chest pain and shortness of breath. She is now followed by [facility], she feels she is doing well.   Patient complains of arthritis in her hands. She states she has been having b/l hand pain for the past several months. She awakens with stiffness, L>R.   She is right-handed.  She is able to make a fist. No known family history of rheumatoid arthritis. Pain is 5/10. She has pain on a daily basis, constant. She has tried Tylenol with minimal relief of her symptoms.   Hypertension This is a chronic problem. The current episode started more than 1 year ago. The problem has been gradually improving since onset. The problem is controlled. Pertinent negatives include no blurred vision. Past treatments include angiotensin blockers and diuretics. The current treatment provides moderate improvement. There are no compliance problems.  Hypertensive end-organ damage includes kidney disease.     Past Medical History:  Diagnosis Date   Anxiety    Atypical chest pain 07/01/2017   Constipation    Depression    Essential hypertension 07/01/2017   Gum disease    dentures worn   History of swelling of feet    Hyperlipidemia    Hypertension    Knee pain    Low back pain    Migraines    Overweight      Family History  Problem Relation Age of Onset   Brain cancer Mother    Hypertension Mother    Diabetes Mother    High Cholesterol Mother    Heart disease Mother    Stroke Mother    Depression Mother    Stroke  Father    Hypertension Father    Heart disease Father    Sudden death Father    Hypertension Sister    Breast cancer Sister    Cancer Brother    Cancer Brother    Cancer Brother      Current Outpatient Medications:    aspirin EC 81 MG tablet, Take 81 mg by mouth daily. Swallow whole., Disp: , Rfl:    atorvastatin (LIPITOR) 20 MG tablet, TAKE 1 TABLET EVERY DAY, Disp: 90 tablet, Rfl: 3   buPROPion ER (WELLBUTRIN SR) 100 MG 12 hr tablet, 1po q am with food, Disp: 60 tablet, Rfl: 0   cetirizine (ZYRTEC) 10 MG chewable tablet, Chew 10 mg by mouth daily., Disp: , Rfl:    diphenhydramine-acetaminophen (TYLENOL PM) 25-500 MG TABS tablet, Take 1 tablet by mouth at bedtime as needed., Disp: , Rfl:    escitalopram (LEXAPRO) 20 MG tablet, TAKE 1 TABLET (20 MG TOTAL) BY MOUTH DAILY., Disp: 90 tablet, Rfl: 3   loratadine (CLARITIN) 10 MG tablet, Take 10 mg by mouth daily., Disp: , Rfl:    olmesartan-hydrochlorothiazide (BENICAR HCT) 20-12.5 MG tablet, TAKE 1 TABLET BY MOUTH DAILY, Disp: 30 tablet, Rfl: 1   Omega-3 Fatty Acids (FISH OIL) 1200 MG CAPS, Take 3 tabs daily, Disp: , Rfl:  Vitamin D, Ergocalciferol, (DRISDOL) 1.25 MG (50000 UNIT) CAPS capsule, Take 1 capsule (50,000 Units total) by mouth every 7 (seven) days., Disp: 8 capsule, Rfl: 0   Zoster Vaccine Adjuvanted (SHINGRIX) injection, Inject 0.5 mLs into the muscle once for 1 dose., Disp: 0.5 mL, Rfl: 0   No Known Allergies    The patient states she uses post menopausal status for birth control. No LMP recorded. Patient is postmenopausal.. Negative for Dysmenorrhea. Negative for: breast discharge, breast lump(s), breast pain and breast self exam. Associated symptoms include abnormal vaginal bleeding. Pertinent negatives include abnormal bleeding (hematology), anxiety, decreased libido, depression, difficulty falling sleep, dyspareunia, history of infertility, nocturia, sexual dysfunction, sleep disturbances, urinary incontinence, urinary  urgency, vaginal discharge and vaginal itching. Diet regular.The patient states her exercise level is    . The patient's tobacco use is:  Social History   Tobacco Use  Smoking Status Never  Smokeless Tobacco Never  . She has been exposed to passive smoke. The patient's alcohol use is:  Social History   Substance and Sexual Activity  Alcohol Use Yes   Comment: glass of wine    Review of Systems  Constitutional: Negative.   HENT: Negative.    Eyes: Negative.  Negative for blurred vision.  Respiratory: Negative.    Cardiovascular: Negative.   Gastrointestinal: Negative.   Endocrine: Negative.   Genitourinary: Negative.   Musculoskeletal:  Positive for arthralgias.       She adds that she has chest tenderness. Denies fall/trauma. Left-sided. Tender to touch. Denies lifting heavy objects.   Skin: Negative.   Allergic/Immunologic: Negative.   Neurological: Negative.   Hematological: Negative.   Psychiatric/Behavioral: Negative.       Today's Vitals   01/28/23 1058  BP: 102/70  Pulse: 68  Temp: 98.4 F (36.9 C)  Weight: 149 lb (67.6 kg)  Height: 5' 0.8" (1.544 m)  PainSc: 4   PainLoc: Finger   Body mass index is 28.34 kg/m.  Wt Readings from Last 3 Encounters:  01/28/23 149 lb (67.6 kg)  01/10/23 154 lb (69.9 kg)  12/20/22 150 lb (68 kg)     Objective:  Physical Exam Vitals and nursing note reviewed.  Constitutional:      Appearance: Normal appearance.  HENT:     Head: Normocephalic and atraumatic.     Right Ear: Tympanic membrane, ear canal and external ear normal.     Left Ear: Tympanic membrane, ear canal and external ear normal.     Nose: Nose normal.     Mouth/Throat:     Mouth: Mucous membranes are moist.     Pharynx: Oropharynx is clear.  Eyes:     Extraocular Movements: Extraocular movements intact.     Conjunctiva/sclera: Conjunctivae normal.     Pupils: Pupils are equal, round, and reactive to light.  Cardiovascular:     Rate and Rhythm: Normal  rate and regular rhythm.     Pulses: Normal pulses.          Dorsalis pedis pulses are 2+ on the right side and 2+ on the left side.     Heart sounds: Normal heart sounds.  Pulmonary:     Effort: Pulmonary effort is normal.     Breath sounds: Normal breath sounds.  Chest:  Breasts:    Tanner Score is 5.     Right: Normal.     Left: Normal.       Comments: Tenderness to palpation, no overlying erythema Abdominal:     General: Abdomen  is flat. Bowel sounds are normal.     Palpations: Abdomen is soft.  Genitourinary:    Comments: deferred Musculoskeletal:        General: Normal range of motion.     Cervical back: Normal range of motion and neck supple.     Comments:  Pos squeeze test on the left  Skin:    General: Skin is warm and dry.  Neurological:     General: No focal deficit present.     Mental Status: She is alert and oriented to person, place, and time.  Psychiatric:        Mood and Affect: Mood normal.        Behavior: Behavior normal.         Assessment And Plan:     Annual physical exam Assessment & Plan: A full exam was performed.  Importance of monthly self breast exams was discussed with the patient.  She is advised to get 30-45 minutes of regular exercise, no less than four to five days per week. Both weight-bearing and aerobic exercises are recommended.  She is advised to follow a healthy diet with at least six fruits/veggies per day, decrease intake of red meat and other saturated fats and to increase fish intake to twice weekly.  Meats/fish should not be fried -- baked, boiled or broiled is preferable. It is also important to cut back on your sugar intake.  Be sure to read labels - try to avoid anything with added sugar, high fructose corn syrup or other sweeteners.  If you must use a sweetener, you can try stevia or monkfruit.  It is also important to avoid artificially sweetened foods/beverages and diet drinks. Lastly, wear SPF 50 sunscreen on exposed skin and  when in direct sunlight for an extended period of time.  Be sure to avoid fast food restaurants and aim for at least 60 ounces of water daily.       Hypertensive nephropathy Assessment & Plan: Chronic, well controlled.  EKG performed, NSR w/ negative T waves and possible anterior  ischemia - no new changes. She will continue with olmesartan/hct 20/12.5mg  daily. She is reminded to follow a low sodium diet.   Orders: -     Microalbumin / creatinine urine ratio -     POCT urinalysis dipstick -     EKG 12-Lead -     CBC -     BMP8+EGFR  Chronic renal disease, stage II Assessment & Plan: Chronic, she is encouraged to keep BP well controlled and to stay well hydrated to decrease risk of CKD progression.     Chest wall tenderness Assessment & Plan: Pt advised this is musculoskeletal. I did pick up her handbag during her visit, this was quite heavy. She does admit to carrying this on her left shoulder. She is advised to remove some items that are not needed from her purse. She will let me know if her sx persist. (Of note, she also plays pickleball which could be exacerbating her sx).    Bilateral hand pain Assessment & Plan: I will check arthritis panel. She may take Tylenol prn. 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   Orders: -     ANA, IFA (with reflex) -     CYCLIC CITRUL PEPTIDE ANTIBODY, IGG/IGA -     Rheumatoid factor -     Sedimentation rate -     Uric acid  Other abnormal glucose Assessment & Plan: Previous labs reviewed, her A1c has been  elevated in the past. I will check an A1c today. Reminded to avoid refined sugars including sugary drinks/foods and processed meats including bacon, sausages and deli meats.    Orders: -     Hemoglobin A1c  Immunization due -     Zoster Vac Recomb Adjuvanted; Inject 0.5 mLs into the muscle once for 1 dose.  Dispense: 0.5 mL; Refill: 0     Return for 1 year physical, 6 month bp. Patient was given opportunity to ask questions. Patient  verbalized understanding of the plan and was able to repeat key elements of the plan. All questions were answered to their satisfaction.    I, Gwynneth Aliment, MD, have reviewed all documentation for this visit. The documentation on 01/28/23 for the exam, diagnosis, procedures, and orders are all accurate and complete.

## 2023-01-29 LAB — MICROALBUMIN / CREATININE URINE RATIO
Creatinine, Urine: 260.1 mg/dL
Microalb/Creat Ratio: 8 mg/g{creat} (ref 0–29)
Microalbumin, Urine: 19.6 ug/mL

## 2023-01-30 ENCOUNTER — Other Ambulatory Visit: Payer: Self-pay | Admitting: Internal Medicine

## 2023-01-30 DIAGNOSIS — M9901 Segmental and somatic dysfunction of cervical region: Secondary | ICD-10-CM | POA: Diagnosis not present

## 2023-01-30 DIAGNOSIS — R768 Other specified abnormal immunological findings in serum: Secondary | ICD-10-CM

## 2023-01-30 DIAGNOSIS — M5441 Lumbago with sciatica, right side: Secondary | ICD-10-CM | POA: Diagnosis not present

## 2023-01-30 DIAGNOSIS — M9906 Segmental and somatic dysfunction of lower extremity: Secondary | ICD-10-CM | POA: Diagnosis not present

## 2023-01-30 DIAGNOSIS — M79641 Pain in right hand: Secondary | ICD-10-CM

## 2023-01-30 DIAGNOSIS — M9903 Segmental and somatic dysfunction of lumbar region: Secondary | ICD-10-CM | POA: Diagnosis not present

## 2023-01-30 LAB — BMP8+EGFR
BUN/Creatinine Ratio: 18 (ref 12–28)
BUN: 19 mg/dL (ref 8–27)
CO2: 26 mmol/L (ref 20–29)
Calcium: 10.3 mg/dL (ref 8.7–10.3)
Chloride: 101 mmol/L (ref 96–106)
Creatinine, Ser: 1.05 mg/dL — ABNORMAL HIGH (ref 0.57–1.00)
Glucose: 95 mg/dL (ref 70–99)
Potassium: 4.5 mmol/L (ref 3.5–5.2)
Sodium: 144 mmol/L (ref 134–144)
eGFR: 55 mL/min/{1.73_m2} — ABNORMAL LOW (ref >59)

## 2023-01-30 LAB — FANA STAINING PATTERNS: Speckled Pattern: 1:80 {titer}

## 2023-01-30 LAB — CBC
Hematocrit: 40.5 % (ref 34.0–46.6)
Hemoglobin: 13 g/dL (ref 11.1–15.9)
MCH: 29 pg (ref 26.6–33.0)
MCHC: 32.1 g/dL (ref 31.5–35.7)
MCV: 90 fL (ref 79–97)
Platelets: 395 10*3/uL (ref 150–450)
RBC: 4.48 x10E6/uL (ref 3.77–5.28)
RDW: 13.9 % (ref 11.7–15.4)
WBC: 4.6 10*3/uL (ref 3.4–10.8)

## 2023-01-30 LAB — HEMOGLOBIN A1C
Est. average glucose Bld gHb Est-mCnc: 126 mg/dL
Hgb A1c MFr Bld: 6 % — ABNORMAL HIGH (ref 4.8–5.6)

## 2023-01-30 LAB — RHEUMATOID FACTOR: Rheumatoid fact SerPl-aCnc: <10 [IU]/mL (ref <14.0)

## 2023-01-30 LAB — ANTINUCLEAR ANTIBODIES, IFA: ANA Titer 1: POSITIVE — AB

## 2023-01-30 LAB — SEDIMENTATION RATE: Sed Rate: 9 mm/h (ref 0–40)

## 2023-01-30 LAB — URIC ACID: Uric Acid: 7.5 mg/dL (ref 3.1–7.9)

## 2023-01-30 LAB — CYCLIC CITRUL PEPTIDE ANTIBODY, IGG/IGA: Cyclic Citrullin Peptide Ab: 6 U (ref 0–19)

## 2023-01-31 ENCOUNTER — Ambulatory Visit (INDEPENDENT_AMBULATORY_CARE_PROVIDER_SITE_OTHER): Payer: Medicare HMO | Admitting: Family Medicine

## 2023-01-31 ENCOUNTER — Encounter (INDEPENDENT_AMBULATORY_CARE_PROVIDER_SITE_OTHER): Payer: Self-pay | Admitting: Family Medicine

## 2023-01-31 VITALS — BP 119/79 | HR 78 | Temp 99.0°F | Ht 60.0 in | Wt 149.0 lb

## 2023-01-31 DIAGNOSIS — Z6830 Body mass index (BMI) 30.0-30.9, adult: Secondary | ICD-10-CM

## 2023-01-31 DIAGNOSIS — E66811 Obesity, class 1: Secondary | ICD-10-CM

## 2023-01-31 DIAGNOSIS — F5089 Other specified eating disorder: Secondary | ICD-10-CM

## 2023-01-31 DIAGNOSIS — R7303 Prediabetes: Secondary | ICD-10-CM | POA: Diagnosis not present

## 2023-01-31 DIAGNOSIS — E559 Vitamin D deficiency, unspecified: Secondary | ICD-10-CM

## 2023-01-31 DIAGNOSIS — F39 Unspecified mood [affective] disorder: Secondary | ICD-10-CM

## 2023-01-31 DIAGNOSIS — E6609 Other obesity due to excess calories: Secondary | ICD-10-CM

## 2023-01-31 MED ORDER — BUPROPION HCL ER (SR) 100 MG PO TB12: ORAL_TABLET | ORAL | 0 refills | Status: DC

## 2023-01-31 MED ORDER — VITAMIN D (ERGOCALCIFEROL) 1.25 MG (50000 UNIT) PO CAPS: 50000.0000 [IU] | ORAL_CAPSULE | ORAL | 0 refills | Status: DC

## 2023-01-31 MED ORDER — ESCITALOPRAM OXALATE 20 MG PO TABS: 10.0000 mg | ORAL_TABLET | Freq: Every day | ORAL | Status: DC

## 2023-01-31 NOTE — Progress Notes (Signed)
[name], D.O.  ABFM, ABOM Specializing in Clinical Bariatric Medicine  Office located at: [address] [place], [place]  [address]   Assessment and Plan:   Medications Discontinued During This Encounter  Medication Reason   escitalopram (LEXAPRO) 20 MG tablet    Vitamin D, Ergocalciferol, (DRISDOL) 1.25 MG (50000 UNIT) CAPS capsule Reorder   buPROPion ER (WELLBUTRIN SR) 100 MG 12 hr tablet Reorder     Meds ordered this encounter  Medications   Vitamin D, Ergocalciferol, (DRISDOL) 1.25 MG (50000 UNIT) CAPS capsule    Sig: Take 1 capsule (50,000 Units total) by mouth every 7 (seven) days.    Dispense:  8 capsule    Refill:  0   buPROPion ER (WELLBUTRIN SR) 100 MG 12 hr tablet    Sig: 1 po BID    Dispense:  60 tablet    Refill:  0   escitalopram (LEXAPRO) 20 MG tablet    Sig: Take 0.5 tablets (10 mg total) by mouth daily.     Prediabetes Assessment & Plan: Lab Results  Component Value Date   HGBA1C 6.0 (H) 01/28/2023   HGBA1C 6.1 (H) 10/23/2022   HGBA1C 5.9 (H) 06/12/2022   INSULIN 15.5 10/23/2022   INSULIN 11.8 06/12/2022   INSULIN 9.7 06/16/2019   Lab Results  Component Value Date   CREATININE 1.05 (H) 01/28/2023   BUN 19 01/28/2023   NA 144 01/28/2023   K 4.5 01/28/2023   CL 101 01/28/2023   CO2 26 01/28/2023   Reviewed some labs obtained with PCP on 10/28. Pt not on any medications for her prediabetes. Her A1c slightly  improved from 6.1 to 6.0. Creatinine,Ser mildly elevated.   I recommended the patient to drink approximately half of their body weight in ounces of water daily. Additionally, have an extra bottle of water for every 30 minutes of exercise. Continue to decrease simple carbs/ sugars; increase fiber and proteins -> follow her meal plan.     Mood disorder (HCC) with emotional eating Assessment & Plan: Denies any SI/HI. Mood is stable today. Pt has been discussing her feelings with her husband more regularly, which has been helping  her emotionally. Pt responding well to Wellbutrin 100 mg once daily; she admits feeling "a little less anxious and more relaxed". Pt also taking Lexapro 20 mg daily.  Since Lexapro can be counterproductive to weight loss, we agreed mutually to decrease Lexapro from 20 mg to 10 mg daily. To further aid with her mood, we will increase Wellbutrin SR to 100 mg bid. Reminded patient of the importance of following their prudent nutrition plan and how food can affect mood as well to support emotional wellbeing.   Vitamin D deficiency Assessment & Plan: vit D deficiency treated with ERGO 50,000 units every 7 days. Last vit D  of 64.2 on 10/24/22.   Continue with their weight loss efforts and high dose vit D - Will refill ERGO today. Will recheck levels in 1 mo or so.    TREATMENT PLAN FOR OBESITY: Class 1 obesity due to excess calories with serious comorbidity and body mass index (BMI) of 30.0 to 30.9 in adult Assessment & Plan: NADIA DUNSON is here to discuss her progress with her obesity treatment plan along with follow-up of her obesity related diagnoses. See Medical Weight Management Flowsheet for complete bioelectrical impedance results.  Since last office visit on 12/20/22 patient's Muscle mass has decreased by 1.2 lb. Fat mass has increased by 0.2  lb. Total body water has not changed. Counseling done on how various foods will affect these numbers and how to maximize success  Total lbs lost to date: 9 lbs  Total weight loss percentage to date: 5.70%   No change to meal plan - see Subjective  Behavioral Intervention Additional resources provided today: n/a Evidence-based interventions for health behavior change were utilized today including the discussion of self monitoring techniques, problem-solving barriers and SMART goal setting techniques.   Regarding patient's less desirable eating habits and patterns, we employed the technique of small changes.  Pt will specifically work on: walking  40 minutes, 3 days per week  FOLLOW UP: Return 02/25/23. She was informed of the importance of frequent follow up visits to maximize her success with intensive lifestyle modifications for her multiple health conditions.  Subjective:   Chief complaint: Obesity Hiilani is here to discuss her progress with her obesity treatment plan. She is on the Category 1 Plan with B/L options and states she is following her eating plan approximately 40% of the time. She states she is walking 40 minutes 2 days per week.  Interval History:  NAYDINE ROGNESS is here for a follow up office visit. Since last OV, Mrs.Ax is down 1 lb. Pt has been discussing her feelings with her husband more regularly, which has been helping her emotionally. Pt will see rheumatology for further evaluation of her hand pain. Food wise, she has no complaints with meal plan.   Pharmacotherapy for weight loss: She is currently taking  Wellbutrin SR 100 mg once daily  for medical weight loss.  Denies side effects.    Review of Systems:  Pertinent positives were addressed with patient today.  Reviewed by clinician on day of visit: allergies, medications, problem list, medical history, surgical history, family history, social history, and previous encounter notes.  Weight Summary and Biometrics   Weight Lost Since Last Visit: 1lb  Weight Gained Since Last Visit: 0lb   Vitals Temp: 99 F (37.2 C) BP: 119/79 Pulse Rate: 78 SpO2: 97 %   Anthropometric Measurements Height: 5' (1.524 m) Weight: 149 lb (67.6 kg) BMI (Calculated): 29.1 Weight at Last Visit: 150lb Weight Lost Since Last Visit: 1lb Weight Gained Since Last Visit: 0lb Starting Weight: 158lb Total Weight Loss (lbs): 9 lb (4.082 kg) Peak Weight: 190lb   Body Composition  Body Fat %: 39 % Fat Mass (lbs): 58.2 lbs Muscle Mass (lbs): 86.4 lbs Total Body Water (lbs): 59.4 lbs Visceral Fat Rating : 12   Other Clinical Data Fasting: no Labs: no Today's  Visit #: 11 Starting Date: 06/12/22   Objective:   PHYSICAL EXAM: Blood pressure 119/79, pulse 78, temperature 99 F (37.2 C), height 5' (1.524 m), weight 149 lb (67.6 kg), SpO2 97%. Body mass index is 29.1 kg/m.  General: Well Developed, well nourished, and in no acute distress.  HEENT: Normocephalic, atraumatic Skin: Warm and dry, cap RF less 2 sec, good turgor Chest:  Normal excursion, shape, no gross abn Respiratory: speaking in full sentences, no conversational dyspnea NeuroM-Sk: Ambulates w/o assistance, moves * 4 Psych: A and O *3, insight good, mood-full  DIAGNOSTIC DATA REVIEWED:  BMET    Component Value Date/Time   NA 144 01/28/2023 1145   K 4.5 01/28/2023 1145   CL 101 01/28/2023 1145   CO2 26 01/28/2023 1145   GLUCOSE 95 01/28/2023 1145   GLUCOSE 99 11/08/2010 1100   BUN 19 01/28/2023 1145   CREATININE 1.05 (H) 01/28/2023 1145  CALCIUM 10.3 01/28/2023 1145   GFRNONAA 63 12/31/2019 1429   GFRAA 73 12/31/2019 1429   Lab Results  Component Value Date   HGBA1C 6.0 (H) 01/28/2023   HGBA1C 5.6 12/17/2018   Lab Results  Component Value Date   INSULIN 15.5 10/23/2022   INSULIN 9.7 06/16/2019   Lab Results  Component Value Date   TSH 0.724 06/12/2022   CBC    Component Value Date/Time   WBC 4.6 01/28/2023 1145   WBC 12.7 (H) 08/15/2008 1804   RBC 4.48 01/28/2023 1145   RBC 4.31 08/15/2008 1804   HGB 13.0 01/28/2023 1145   HCT 40.5 01/28/2023 1145   PLT 395 01/28/2023 1145   MCV 90 01/28/2023 1145   MCH 29.0 01/28/2023 1145   MCHC 32.1 01/28/2023 1145   MCHC 34.4 08/15/2008 1804   RDW 13.9 01/28/2023 1145   Iron Studies No results found for: "IRON", "TIBC", "FERRITIN", "IRONPCTSAT" Lipid Panel     Component Value Date/Time   CHOL 144 10/24/2022 1113   TRIG 90 10/24/2022 1113   HDL 47 10/24/2022 1113   CHOLHDL 3.6 01/19/2022 1540   LDLCALC 80 10/24/2022 1113   Hepatic Function Panel     Component Value Date/Time   PROT 6.8 10/24/2022  1113   ALBUMIN 3.9 10/24/2022 1113   AST 24 10/24/2022 1113   ALT 20 10/24/2022 1113   ALKPHOS 77 10/24/2022 1113   BILITOT 0.3 10/24/2022 1113      Component Value Date/Time   TSH 0.724 06/12/2022 1227   Nutritional Lab Results  Component Value Date   VD25OH 64.2 10/24/2022   VD25OH 28.9 (L) 06/12/2022   VD25OH 53.2 12/31/2019    Attestations:   I, Special Puri, acting as a Stage manager for Thomasene Lot, DO., have compiled all relevant documentation for today's office visit on behalf of Thomasene Lot, DO, while in the presence of [employer], DO.  I have reviewed the above documentation for accuracy and completeness, and I agree with the above. [name], D.O.  The 21st Century Cures Act was signed into law in 2016 which includes the topic of electronic health records.  This provides immediate access to information in MyChart.  This includes consultation notes, operative notes, office notes, lab results and pathology reports.  If you have any questions about what you read please let us know at your next visit so we can discuss your concerns and take corrective action if need be.  We are right here with you.

## 2023-02-06 DIAGNOSIS — M9901 Segmental and somatic dysfunction of cervical region: Secondary | ICD-10-CM | POA: Diagnosis not present

## 2023-02-06 DIAGNOSIS — M9903 Segmental and somatic dysfunction of lumbar region: Secondary | ICD-10-CM | POA: Diagnosis not present

## 2023-02-06 DIAGNOSIS — M9906 Segmental and somatic dysfunction of lower extremity: Secondary | ICD-10-CM | POA: Diagnosis not present

## 2023-02-06 DIAGNOSIS — M5441 Lumbago with sciatica, right side: Secondary | ICD-10-CM | POA: Diagnosis not present

## 2023-02-13 DIAGNOSIS — M9901 Segmental and somatic dysfunction of cervical region: Secondary | ICD-10-CM | POA: Diagnosis not present

## 2023-02-13 DIAGNOSIS — M5441 Lumbago with sciatica, right side: Secondary | ICD-10-CM | POA: Diagnosis not present

## 2023-02-13 DIAGNOSIS — M9903 Segmental and somatic dysfunction of lumbar region: Secondary | ICD-10-CM | POA: Diagnosis not present

## 2023-02-13 DIAGNOSIS — M9906 Segmental and somatic dysfunction of lower extremity: Secondary | ICD-10-CM | POA: Diagnosis not present

## 2023-02-15 ENCOUNTER — Encounter: Payer: Self-pay | Admitting: Internal Medicine

## 2023-02-20 DIAGNOSIS — M9903 Segmental and somatic dysfunction of lumbar region: Secondary | ICD-10-CM | POA: Diagnosis not present

## 2023-02-20 DIAGNOSIS — M5441 Lumbago with sciatica, right side: Secondary | ICD-10-CM | POA: Diagnosis not present

## 2023-02-20 DIAGNOSIS — M9906 Segmental and somatic dysfunction of lower extremity: Secondary | ICD-10-CM | POA: Diagnosis not present

## 2023-02-20 DIAGNOSIS — M9901 Segmental and somatic dysfunction of cervical region: Secondary | ICD-10-CM | POA: Diagnosis not present

## 2023-02-25 ENCOUNTER — Encounter (INDEPENDENT_AMBULATORY_CARE_PROVIDER_SITE_OTHER): Payer: Self-pay | Admitting: Family Medicine

## 2023-02-25 ENCOUNTER — Ambulatory Visit (INDEPENDENT_AMBULATORY_CARE_PROVIDER_SITE_OTHER): Payer: Medicare HMO | Admitting: Family Medicine

## 2023-02-25 VITALS — BP 119/83 | HR 70 | Temp 98.7°F | Ht 60.0 in | Wt 149.0 lb

## 2023-02-25 DIAGNOSIS — Z6830 Body mass index (BMI) 30.0-30.9, adult: Secondary | ICD-10-CM | POA: Diagnosis not present

## 2023-02-25 DIAGNOSIS — F5089 Other specified eating disorder: Secondary | ICD-10-CM

## 2023-02-25 DIAGNOSIS — F39 Unspecified mood [affective] disorder: Secondary | ICD-10-CM

## 2023-02-25 DIAGNOSIS — R7303 Prediabetes: Secondary | ICD-10-CM

## 2023-02-25 DIAGNOSIS — E559 Vitamin D deficiency, unspecified: Secondary | ICD-10-CM | POA: Diagnosis not present

## 2023-02-25 DIAGNOSIS — E6609 Other obesity due to excess calories: Secondary | ICD-10-CM

## 2023-02-25 DIAGNOSIS — E66811 Obesity, class 1: Secondary | ICD-10-CM

## 2023-02-25 DIAGNOSIS — M5430 Sciatica, unspecified side: Secondary | ICD-10-CM

## 2023-02-25 MED ORDER — BUPROPION HCL ER (SR) 100 MG PO TB12: ORAL_TABLET | ORAL | 0 refills | Status: DC

## 2023-02-25 MED ORDER — VITAMIN D (ERGOCALCIFEROL) 1.25 MG (50000 UNIT) PO CAPS: 50000.0000 [IU] | ORAL_CAPSULE | ORAL | 0 refills | Status: DC

## 2023-02-25 NOTE — Progress Notes (Signed)
Sara Obrien, D.O.  ABFM, ABOM Specializing in Clinical Bariatric Medicine  Office located at: [address] [place], [place]  [address]   Assessment and Plan:  Draw vitamin D and possibly other labs if needed next OV.  FOR THE DISEASE OF OBESITY: Class 1 obesity due to excess calories with serious comorbidity and body mass index (BMI) of 30.0 to 30.9 in adult Since last office visit on 01/31/2023 patient's  Muscle mass has decreased by 2.2lb. Fat mass has increased by 2.4lb. Total body water has increased by 1.6lb.  Counseling done on how various foods will affect these numbers and how to maximize success  Total lbs lost to date: 9 Total weight loss percentage to date: 5.70%    Recommended Dietary Goals [name] is currently in the action stage of change. As such, her goal is to continue weight management plan.  She has agreed to: continue current plan  Behavioral Intervention We discussed the following today: work on managing stress, creating time for self-care and relaxation, continue to work on implementation of reduced calorie nutritional plan, and continue to work on maintaining a reduced calorie state, getting the recommended amount of protein, incorporating whole foods, making healthy choices, staying well hydrated and practicing mindfulness when eating.  Additional resources provided today: None  Evidence-based interventions for health behavior change were utilized today including the discussion of self monitoring techniques, problem-solving barriers and SMART goal setting techniques.   Regarding patient's less desirable eating habits and patterns, we employed the technique of small changes.   Pt will specifically work on: Follow-up with Specialist for her sciatica pain for next visit.    Recommended Physical Activity Goals [name] has been advised to work up to 150 minutes of moderate intensity aerobic activity a week and strengthening exercises 2-3 times per  week for cardiovascular health, weight loss maintenance and preservation of muscle mass.   She has agreed to :  Continue current level of physical activity    Pharmacotherapy We discussed various medication options to help [name] with her weight loss efforts and we both agreed to : continue with nutritional and behavioral strategies   FOR ASSOCIATED CONDITIONS ADDRESSED TODAY: Mood disorder (HCC)- emotional eating Assessment:  Condition is improved. Pt informed me that since decreasing Lexapro from 20 mg to 10 mg daily and increasing Wellbutrin  SR to 100 mg bid she has tolerated this really well. She notes sleeping better and having less depression.   Plan: - Continue with Lexapro and Wellbutrin at current dose. Behavior modification techniques were discussed today to help deal with emotional/ non-hunger eating behaviors including but not limited to exercise for stress management, meditation/prayer, behavorial sessions with her therapist and self care activities like adequate sleep (7-9 hrs/nite). No SI/ HI.  Mood stable currently. We will continue to monitor closely. Mood disorder (HCC)- emotional eating -     buPROPion HCl ER (SR); 1 po BID  Dispense: 60 tablet; Refill: 0   Vitamin D deficiency Assessment:  Condition is Controlled.. Patient reports good compliance and tolerance of taking ERGO 50k lU. She denies any significant side effects or any change in her bone health beside having sciatica pain.   Plan: - Continue ERGO at current dose once weekly. I will refill today. I reviewed possible symptoms of low Vitamin D:  low energy, depressed mood, muscle aches, joint aches, osteoporosis etc. with patient. We will continue to monitor levels regularly (every 3-4 mo on average) to keep levels within normal limits  and prevent over supplementation. Vitamin D deficiency -     Vitamin D (Ergocalciferol); Take 1 capsule (50,000 Units total) by mouth every 7 (seven) days.  Dispense: 8 capsule;  Refill: 0   Prediabetes Assessment:  Condition is Not optimized.[name] Pt A1c did improve from 6.1. to 6.0 as of 01/28/2023. This is diet/exercise controlled. Pt endorses that her hunger and cravings are more controlled when eating  everything on the prescribed meal plan.  Lab Results  Component Value Date   HGBA1C 6.0 (H) 01/28/2023   HGBA1C 6.1 (H) 10/23/2022   HGBA1C 5.9 (H) 06/12/2022   INSULIN 15.5 10/23/2022   INSULIN 11.8 06/12/2022   INSULIN 9.7 06/16/2019    Plan: - Continue her prudent nutritional plan that is low in simple carbohydrates, saturated fats and trans fats to goal of 5-10% weight loss to achieve significant health benefits.  Pt encouraged to continually advance exercise and cardiovascular fitness as tolerated throughout weight loss journey. We will recheck A1c and fasting insulin level in approximately 3 months from last check, or as deemed appropriate.    Sciatica without back pain, unspecified laterality Assessment & Plan: I advised pt follow-up with her PCP about her sciatica pain and a potential referral to physical therapy. I discussed with pt to find a Research scientist (life sciences) for treatment of her back pain and look into a non-operative provider. I recommended [facility] as a potential place and to look into MD. Carlean Purl and MD. Shayne Alken.    Follow up:   Return in about 5 weeks (around 04/01/2023). She was informed of the importance of frequent follow up visits to maximize her success with intensive lifestyle modifications for her multiple health conditions.  Subjective:   Chief complaint: Obesity Sara Obrien is here to discuss her progress with her obesity treatment plan. She is on the the Category 1 Plan with B and L options and states she is following her eating plan approximately 50% of the time. She states she is walking 120 minutes 3 days per week.  Interval History:  Sara Obrien is here for a follow up office visit. Since last OV,   she has been ok. She informed me that she has been having sciatica pain and has been going to a chiropractor with no resolve. She continues to wear a knee brace on her right knee. This hinders the amount she wants to walk. She has been feeling overwhelmed lately due to multiple stressor.   Barriers identified: multiple competing priorities.   Pharmacotherapy for weight loss: She is currently taking no anti-obesity medication.   Review of Systems:  Pertinent positives were addressed with patient today.  Reviewed by clinician on day of visit: allergies, medications, problem list, medical history, surgical history, family history, social history, and previous encounter notes.  Weight Summary and Biometrics   Weight Lost Since Last Visit: 0lb  Weight Gained Since Last Visit: 0lb    Vitals Temp: 98.7 F (37.1 C) BP: 119/83 Pulse Rate: 70 SpO2: 97 %   Anthropometric Measurements Height: 5' (1.524 m) Weight: 149 lb (67.6 kg) BMI (Calculated): 29.1 Weight at Last Visit: 149lb Weight Lost Since Last Visit: 0lb Weight Gained Since Last Visit: 0lb Starting Weight: 158lb Total Weight Loss (lbs): 9 lb (4.082 kg) Peak Weight: 190lb   Body Composition  Body Fat %: 40.6 % Fat Mass (lbs): 60.6 lbs Muscle Mass (lbs): 84.2 lbs Total Body Water (lbs): 61 lbs Visceral Fat Rating : 12   Other Clinical Data  Fasting: no Labs: no Today's Visit #: 12 Starting Date: 06/12/22    Objective:   PHYSICAL EXAM: Blood pressure 119/83, pulse 70, temperature 98.7 F (37.1 C), height 5' (1.524 m), weight 149 lb (67.6 kg), SpO2 97%. Body mass index is 29.1 kg/m.  General: she is overweight, cooperative and in no acute distress. PSYCH: Has normal mood, affect and thought process.   HEENT: EOMI, sclerae are anicteric. Lungs: Normal breathing effort, no conversational dyspnea. Extremities: Moves * 4 Neurologic: A and O * 3, good insight  DIAGNOSTIC DATA REVIEWED: BMET    Component  Value Date/Time   NA 144 01/28/2023 1145   K 4.5 01/28/2023 1145   CL 101 01/28/2023 1145   CO2 26 01/28/2023 1145   GLUCOSE 95 01/28/2023 1145   GLUCOSE 99 11/08/2010 1100   BUN 19 01/28/2023 1145   CREATININE 1.05 (H) 01/28/2023 1145   CALCIUM 10.3 01/28/2023 1145   GFRNONAA 63 12/31/2019 1429   GFRAA 73 12/31/2019 1429   Lab Results  Component Value Date   HGBA1C 6.0 (H) 01/28/2023   HGBA1C 5.6 12/17/2018   Lab Results  Component Value Date   INSULIN 15.5 10/23/2022   INSULIN 9.7 06/16/2019   Lab Results  Component Value Date   TSH 0.724 06/12/2022   CBC    Component Value Date/Time   WBC 4.6 01/28/2023 1145   WBC 12.7 (H) 08/15/2008 1804   RBC 4.48 01/28/2023 1145   RBC 4.31 08/15/2008 1804   HGB 13.0 01/28/2023 1145   HCT 40.5 01/28/2023 1145   PLT 395 01/28/2023 1145   MCV 90 01/28/2023 1145   MCH 29.0 01/28/2023 1145   MCHC 32.1 01/28/2023 1145   MCHC 34.4 08/15/2008 1804   RDW 13.9 01/28/2023 1145   Iron Studies No results found for: "IRON", "TIBC", "FERRITIN", "IRONPCTSAT" Lipid Panel     Component Value Date/Time   CHOL 144 10/24/2022 1113   TRIG 90 10/24/2022 1113   HDL 47 10/24/2022 1113   CHOLHDL 3.6 01/19/2022 1540   LDLCALC 80 10/24/2022 1113   Hepatic Function Panel     Component Value Date/Time   PROT 6.8 10/24/2022 1113   ALBUMIN 3.9 10/24/2022 1113   AST 24 10/24/2022 1113   ALT 20 10/24/2022 1113   ALKPHOS 77 10/24/2022 1113   BILITOT 0.3 10/24/2022 1113      Component Value Date/Time   TSH 0.724 06/12/2022 1227   Nutritional Lab Results  Component Value Date   VD25OH 64.2 10/24/2022   VD25OH 28.9 (L) 06/12/2022   VD25OH 53.2 12/31/2019    Attestations:   I, Clinical biochemist, acting as a Stage manager for [employer], DO., have compiled all relevant documentation for today's office visit on behalf of Thomasene Lot, DO, while in the presence of [employer], DO.  Reviewed by clinician on day of visit:  allergies, medications, problem list, medical history, surgical history, family history, social history, and previous encounter notes pertinent to patient's obesity diagnosis. preparing to see patient (e.g. review and interpretation of tests, old notes ), obtaining and/or reviewing separately obtained history, performing a medically appropriate examination or evaluation, counseling and educating the patient, ordering medications, test or procedures, documenting clinical information in the electronic or other health care record, and independently interpreting results and communicating results to the patient, family, or caregiver   I have reviewed the above documentation for accuracy and completeness, and I agree with the above. Sara Obrien, D.O.  The 21st Century Cures Act was  signed into law in 2016 which includes the topic of electronic health records.  This provides immediate access to information in MyChart.  This includes consultation notes, operative notes, office notes, lab results and pathology reports.  If you have any questions about what you read please let us know at your next visit so we can discuss your concerns and take corrective action if need be.  We are right here with you.

## 2023-02-27 DIAGNOSIS — M5441 Lumbago with sciatica, right side: Secondary | ICD-10-CM | POA: Diagnosis not present

## 2023-02-27 DIAGNOSIS — M9906 Segmental and somatic dysfunction of lower extremity: Secondary | ICD-10-CM | POA: Diagnosis not present

## 2023-02-27 DIAGNOSIS — M9903 Segmental and somatic dysfunction of lumbar region: Secondary | ICD-10-CM | POA: Diagnosis not present

## 2023-02-27 DIAGNOSIS — M9901 Segmental and somatic dysfunction of cervical region: Secondary | ICD-10-CM | POA: Diagnosis not present

## 2023-03-08 ENCOUNTER — Encounter: Payer: Self-pay | Admitting: Internal Medicine

## 2023-03-11 ENCOUNTER — Telehealth: Payer: Medicare HMO | Admitting: Internal Medicine

## 2023-03-11 VITALS — BP 115/70 | Temp 98.6°F | Ht 60.0 in | Wt 149.0 lb

## 2023-03-11 DIAGNOSIS — M25562 Pain in left knee: Secondary | ICD-10-CM

## 2023-03-11 DIAGNOSIS — M5442 Lumbago with sciatica, left side: Secondary | ICD-10-CM

## 2023-03-11 DIAGNOSIS — G8929 Other chronic pain: Secondary | ICD-10-CM

## 2023-03-11 NOTE — Patient Instructions (Signed)
Sciatica  Sciatica is pain, weakness, tingling, or loss of feeling (numbness) along the sciatic nerve. The sciatic nerve starts in the lower back and goes down the back of each leg. Sciatica usually affects one side of the body. Sciatica usually goes away on its own or with treatment. Sometimes, sciatica may come back. What are the causes? This condition happens when the sciatic nerve is pinched or has pressure put on it. This may be caused by: A disk in between the bones of the spine bulging out too far (herniated disk). Changes in the spinal disks due to aging. A condition that affects a muscle in the butt. Extra bone growth near the sciatic nerve. A break (fracture) of the area between your hip bones (pelvis). Pregnancy. Tumor. This is rare. What increases the risk? You are more likely to develop this condition if you: Play sports that put pressure or stress on the spine. Have poor strength and ease of movement (flexibility). Have had a back injury or back surgery. Sit for long periods of time. Do activities that involve bending or lifting over and over again. Are very overweight (obese). What are the signs or symptoms? Symptoms can vary from mild to very bad. They may include: Any of these problems in the lower back, leg, hip, or butt: Mild tingling, loss of feeling, or dull aches. A burning feeling. Sharp pains. Loss of feeling in the back of the calf or the sole of the foot. Leg weakness. Very bad back pain that makes it hard to move. These symptoms may get worse when you cough, sneeze, or laugh. They may also get worse when you sit or stand for long periods of time. How is this treated? This condition often gets better without any treatment. However, treatment may include: Changing or cutting back on physical activity when you have pain. Exercising, including strengthening and stretching. Putting ice or heat on the affected area. Shots of medicines to relieve pain and  swelling or to relax your muscles. Surgery. Follow these instructions at home: Medicines Take over-the-counter and prescription medicines only as told by your doctor. Ask your doctor if you should avoid driving or using machines while you are taking your medicine. Managing pain     If told, put ice on the affected area. To do this: Put ice in a plastic bag. Place a towel between your skin and the bag. Leave the ice on for 20 minutes, 2-3 times a day. If your skin turns bright red, take off the ice right away to prevent skin damage. The risk of skin damage is higher if you cannot feel pain, heat, or cold. If told, put heat on the affected area. Do this as often as told by your doctor. Use the heat source that your doctor tells you to use, such as a moist heat pack or a heating pad. Place a towel between your skin and the heat source. Leave the heat on for 20-30 minutes. If your skin turns bright red, take off the heat right away to prevent burns. The risk of burns is higher if you cannot feel pain, heat, or cold. Activity  Return to your normal activities when your doctor says that it is safe. Avoid activities that make your symptoms worse. Take short rests during the day. When you rest for a long time, do some physical activity or stretching between periods of rest. Avoid sitting for a long time without moving. Get up and move around at least one time each   hour. Do exercises and stretches as told by your doctor. Do not lift anything that is heavier than 10 lb (4.5 kg). Avoid lifting heavy things even when you do not have symptoms. Avoid lifting heavy things over and over. When you lift objects, always lift in a way that is safe for your body. To do this, you should: Bend your knees. Keep the object close to your body. Avoid twisting. General instructions Stay at a healthy weight. Wear comfortable shoes that support your feet. Avoid wearing high heels. Avoid sleeping on a mattress  that is too soft or too hard. You might have less pain if you sleep on a mattress that is firm enough to support your back. Contact a doctor if: Your pain is not controlled by medicine. Your pain does not get better. Your pain gets worse. Your pain lasts longer than 4 weeks. You lose weight without trying. Get help right away if: You cannot control when you pee (urinate) or poop (have a bowel movement). You have weakness in any of these areas and it gets worse: Lower back. The area between your hip bones. Butt. Legs. You have redness or swelling of your back. You have a burning feeling when you pee. Summary Sciatica is pain, weakness, tingling, or loss of feeling (numbness) along the sciatic nerve. This may include the lower back, legs, hips, and butt. This condition happens when the sciatic nerve is pinched or has pressure put on it. Treatment often includes rest, exercise, medicines, and putting ice or heat on the affected area. This information is not intended to replace advice given to you by your health care provider. Make sure you discuss any questions you have with your health care provider. Document Revised: 06/26/2021 Document Reviewed: 06/26/2021 Elsevier Patient Education  2024 Elsevier Inc.  

## 2023-03-11 NOTE — Progress Notes (Unsigned)
Virtual Visit via Video Note  I,Jameka J Llittleton, CMA,acting as a scribe for Gwynneth Aliment, MD.,have documented all relevant documentation on the behalf of Gwynneth Aliment, MD,as directed by  Gwynneth Aliment, MD while in the presence of Gwynneth Aliment, MD.  I connected with Sara Obrien on 03/13/23 at 10:20 AM EST by a video enabled telemedicine application and verified that I am speaking with the correct person using two identifiers.  Patient Location: Home Provider Location: Office/Clinic  I discussed the limitations, risks, security, and privacy concerns of performing an evaluation and management service by video and the availability of in person appointments. I also discussed with the patient that there may be a patient responsible charge related to this service. The patient expressed understanding and agreed to proceed.  Subjective: PCP: Dorothyann Peng, MD  Chief Complaint  Patient presents with   Referral   Patient presents today for virtual visit.  She would like referral to orthopaedic for further evaluation of her back pain. She reported she has been going to a chiropractor for treatment of sciatica since May 2023, but she is not seeing any improvements. She would like to be referred to Dr[facility]Richard Ramos at [facility] She denies having any LE weakness. Denies urinary/fecal incontinence.    Additionally, she c/o left knee pain. Denies recent fall/trauma. Thinks it may be related to her back pain. Her friend suggested she try knee brace, she is now wearing regularly. She does admit that pain is lessened when she wears the brace.  She reports if she doesn't wear the brace, then her leg aches real bad and she is unable to walk.   Back Pain This is a chronic problem. The current episode started more than 1 year ago. The problem has been gradually worsening since onset. The pain is present in the lumbar spine. The quality of the pain is described as shooting and stabbing. The  pain radiates to the left thigh and left knee. The pain is at a severity of 8/10. The pain is moderate. Pertinent negatives include no bladder incontinence, bowel incontinence, dysuria, numbness, paresthesias or perianal numbness. She has tried chiropractic manipulation and analgesics for the symptoms. The treatment provided moderate relief.     ROS:  Review of Systems  Constitutional: Negative.   HENT: Negative.    Respiratory: Negative.    Cardiovascular: Negative.   Gastrointestinal:  Negative for bowel incontinence.  Genitourinary:  Negative for bladder incontinence and dysuria.  Musculoskeletal:  Positive for back pain and joint pain.  Neurological:  Negative for numbness and paresthesias.     Current Outpatient Medications:    aspirin EC 81 MG tablet, Take 81 mg by mouth daily. Swallow whole., Disp: , Rfl:    atorvastatin (LIPITOR) 20 MG tablet, TAKE 1 TABLET EVERY DAY, Disp: 90 tablet, Rfl: 3   buPROPion ER (WELLBUTRIN SR) 100 MG 12 hr tablet, 1 po BID, Disp: 60 tablet, Rfl: 0   cetirizine (ZYRTEC) 10 MG chewable tablet, Chew 10 mg by mouth daily., Disp: , Rfl:    diphenhydramine-acetaminophen (TYLENOL PM) 25-500 MG TABS tablet, Take 1 tablet by mouth at bedtime as needed., Disp: , Rfl:    escitalopram (LEXAPRO) 20 MG tablet, Take 0.5 tablets (10 mg total) by mouth daily., Disp: , Rfl:    loratadine (CLARITIN) 10 MG tablet, Take 10 mg by mouth daily., Disp: , Rfl:    olmesartan-hydrochlorothiazide (BENICAR HCT) 20-12.5 MG tablet, TAKE 1 TABLET BY MOUTH DAILY, Disp: 30 tablet,  Rfl: 1   Omega-3 Fatty Acids (FISH OIL) 1200 MG CAPS, Take 3 tabs daily, Disp: , Rfl:    Vitamin D, Ergocalciferol, (DRISDOL) 1.25 MG (50000 UNIT) CAPS capsule, Take 1 capsule (50,000 Units total) by mouth every 7 (seven) days., Disp: 8 capsule, Rfl: 0  Observations/Objective: Today's Vitals   03/11/23 1136  BP: 115/70  Temp: 98.6 F (37 C)  Weight: 149 lb (67.6 kg)  Height: 5' (1.524 m)   Physical  Exam Vitals and nursing note reviewed.  Constitutional:      Appearance: Normal appearance.  HENT:     Head: Normocephalic and atraumatic.  Eyes:     Extraocular Movements: Extraocular movements intact.  Cardiovascular:     Rate and Rhythm: Normal rate and regular rhythm.     Heart sounds: Normal heart sounds.  Pulmonary:     Effort: Pulmonary effort is normal.     Breath sounds: Normal breath sounds.  Musculoskeletal:     Cervical back: Normal range of motion.  Skin:    General: Skin is warm.  Neurological:     General: No focal deficit present.     Mental Status: She is alert.  Psychiatric:        Mood and Affect: Mood normal.        Behavior: Behavior normal.     Assessment and Plan: Chronic left-sided low back pain with left-sided sciatica Assessment & Plan: Chronic, she received chiropractic care for at least 18 months without relief of her sx. She has taken OTC meds, as well as rx meds without relief of her sx. It appears her sx have progression, will schedule her for MRI lumbar spine and refer her to Ortho as requested.   Orders: -     Ambulatory referral to Orthopedic Surgery -     MR LUMBAR SPINE WO CONTRAST; Future  Chronic pain of left knee Assessment & Plan: She is advised to apply Voltaren gel to front/sides/back of knee two to three times daily as needed. Pt advised she will have radiographic studies at Ortho appt. She is also encouraged to follow anti-inflammatory diet.  As stated above, she requests [facility] referral.   Orders: -     Ambulatory referral to Orthopedic Surgery   TIME SPENT: 11 minutes  Follow Up Instructions: Return if symptoms worsen or fail to improve.   I discussed the assessment and treatment plan with the patient. The patient was provided an opportunity to ask questions, and all were answered. The patient agreed with the plan and demonstrated an understanding of the instructions.   The patient was advised to call back or seek  an in-person evaluation if the symptoms worsen or if the condition fails to improve as anticipated.  The above assessment and management plan was discussed with the patient. The patient verbalized understanding of and has agreed to the management plan.   I, Gwynneth Aliment, MD, have reviewed all documentation for this visit. The documentation on 03/11/23 for the exam, diagnosis, procedures, and orders are all accurate and complete.

## 2023-03-13 ENCOUNTER — Encounter: Payer: Self-pay | Admitting: Internal Medicine

## 2023-03-13 DIAGNOSIS — G8929 Other chronic pain: Secondary | ICD-10-CM | POA: Insufficient documentation

## 2023-03-13 NOTE — Assessment & Plan Note (Addendum)
She is advised to apply Voltaren gel to front/sides/back of knee two to three times daily as needed. Pt advised she will have radiographic studies at Ortho appt. She is also encouraged to follow anti-inflammatory diet.  As stated above, she requests [facility] referral.

## 2023-03-13 NOTE — Assessment & Plan Note (Signed)
Chronic, she received chiropractic care for at least 18 months without relief of her sx. She has taken OTC meds, as well as rx meds without relief of her sx. It appears her sx have progression, will schedule her for MRI lumbar spine and refer her to Ortho as requested.

## 2023-03-15 ENCOUNTER — Ambulatory Visit (HOSPITAL_BASED_OUTPATIENT_CLINIC_OR_DEPARTMENT_OTHER)
Admission: RE | Admit: 2023-03-15 | Discharge: 2023-03-15 | Disposition: A | Discharge status: Home / Self Care | Payer: Medicare HMO | Source: Ambulatory Visit | Attending: Internal Medicine | Admitting: Internal Medicine

## 2023-03-15 DIAGNOSIS — G8929 Other chronic pain: Secondary | ICD-10-CM | POA: Diagnosis not present

## 2023-03-15 DIAGNOSIS — M47816 Spondylosis without myelopathy or radiculopathy, lumbar region: Secondary | ICD-10-CM | POA: Diagnosis not present

## 2023-03-15 DIAGNOSIS — M5136 Other intervertebral disc degeneration, lumbar region with discogenic back pain only: Secondary | ICD-10-CM | POA: Diagnosis not present

## 2023-03-15 DIAGNOSIS — M5442 Lumbago with sciatica, left side: Secondary | ICD-10-CM | POA: Insufficient documentation

## 2023-03-15 DIAGNOSIS — M48061 Spinal stenosis, lumbar region without neurogenic claudication: Secondary | ICD-10-CM | POA: Diagnosis not present

## 2023-03-15 DIAGNOSIS — M5137 Other intervertebral disc degeneration, lumbosacral region with discogenic back pain only: Secondary | ICD-10-CM | POA: Diagnosis not present

## 2023-03-27 NOTE — Progress Notes (Signed)
 Office Visit Note  Patient: Sara Obrien             Date of Birth: May 25, 1945           MRN: [id number]             PCP: Jarold Medici, MD Referring: Jarold Medici, MD Visit Date: 04/10/2023 Occupation: @GUAROCC @  Subjective:  Pain in multiple joints  History of Present Illness: Sara Obrien is a 77 y.o. female referred by Dr. Grayce Jarold for evaluation of arthralgias.  According the patient her symptoms started with lower extremity muscle spasms since she was a child.  She always had muscle aches and pains.  The pain in the muscles is mostly in her legs.  She states for the last 4 years she has been having pain in her left knee joint.  She was evaluated by orthopedics and was told that she had inflammation.  She has been also seeing orthopedics for the last few years for lower back pain.  She was advised that she had degenerative disc disease.  She recalls having injections in her lower back about 3 years ago at [facility].  She still has off-and-on discomfort in her lower back.  She had been going to a chiropractor for the last 1-1/2-year without much improvement.  She states the pain in her back radiates into her left lower extremity.  She states for the last two 1 year she has been having pain and stiffness in her both hands.  She occasionally notices swelling.  She states she has an exercise ball which she uses to strengthen her hands.  None of the other joints are painful.  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  She is gravida 2, para 2.  There is no history of preeclampsia or DVTs.  There is history of osteoarthritis in her sister and her mother.  There is no family history of autoimmune disease.  There is no personal or family history of psoriasis years she also plays pickle ball occasionally.  She walks for exercise.    Activities of Daily Living:  Patient reports morning stiffness for 2 hours.   Patient Reports nocturnal pain.  Difficulty  dressing/grooming: Denies Difficulty climbing stairs: Denies Difficulty getting out of chair: Denies Difficulty using hands for taps, buttons, cutlery, and/or writing: Denies  Review of Systems  Constitutional:  Positive for fatigue.  HENT:  Negative for mouth sores and mouth dryness.   Eyes:  Positive for dryness.  Respiratory:  Negative for shortness of breath.   Cardiovascular:  Positive for chest pain. Negative for palpitations.  Gastrointestinal:  Negative for blood in stool, constipation and diarrhea.  Endocrine: Negative for increased urination.  Genitourinary:  Negative for involuntary urination.  Musculoskeletal:  Positive for joint pain, gait problem, joint pain, joint swelling, myalgias, muscle weakness, morning stiffness, muscle tenderness and myalgias.  Skin:  Positive for hair loss. Negative for color change, rash and sensitivity to sunlight.  Allergic/Immunologic: Negative for susceptible to infections.  Neurological:  Positive for dizziness and headaches.  Hematological:  Negative for swollen glands.  Psychiatric/Behavioral:  Positive for depressed mood and sleep disturbance. The patient is not nervous/anxious.     PMFS History:  Patient Active Problem List   Diagnosis Date Noted   Chronic left-sided low back pain with left-sided sciatica 03/13/2023   Chronic pain of left knee 03/13/2023   Annual physical exam 01/28/2023   Bilateral hand pain 01/28/2023   Chest wall tenderness 01/28/2023  Paresthesia and pain of left extremity 07/23/2022   Elevated vitamin B12 level 06/26/2022   Vitamin D  deficiency 06/26/2022   Other fatigue 06/12/2022   SOB (shortness of breath) on exertion 06/12/2022   Class 1 obesity with serious comorbidity and body mass index (BMI) of 31.0 to 31.9 in adult 06/12/2022   Hypertension due to endocrine disorder 06/12/2022   Generalized OA 06/12/2022   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/12/2022   Sleep disturbance 06/12/2022    Prediabetes 05/28/2022   Generalized obesity 05/28/2022   Acute non-recurrent maxillary sinusitis 07/18/2021   Myalgia 01/10/2021   Microscopic hematuria 01/10/2021   Other abnormal glucose 01/10/2021   Hypertensive nephropathy 12/31/2019   Chronic renal disease, stage II 12/31/2019   Vitamin D  deficiency disease 12/31/2019   Class 1 obesity due to excess calories with serious comorbidity and body mass index (BMI) of 30.0 to 30.9 in adult 12/31/2019   Atypical chest pain 07/01/2017   Essential hypertension 07/01/2017   Hyperlipidemia 07/01/2017    Past Medical History:  Diagnosis Date   Anxiety    Atypical chest pain 07/01/2017   Constipation    Depression    Essential hypertension 07/01/2017   Gum disease    dentures worn   History of swelling of feet    Hyperlipidemia    Hypertension    Knee pain    Low back pain    Migraines    Overweight     Family History  Problem Relation Age of Onset   Brain cancer Mother    Hypertension Mother    Diabetes Mother    High Cholesterol Mother    Heart disease Mother    Stroke Mother    Depression Mother    Stroke Father    Hypertension Father    Heart disease Father    Sudden death Father    Hypertension Sister    Breast cancer Sister    Cancer Brother    Cancer Brother    Cancer Brother    Past Surgical History:  Procedure Laterality Date   BREAST LUMPECTOMY     TUBAL LIGATION     Social History   Social History Narrative   Not on file   Immunization History  Administered Date(s) Administered   Fluad Quad(high Dose 65+) 12/10/2019, 12/28/2022   Influenza, High Dose Seasonal PF 12/17/2018, 12/13/2020   Influenza-Unspecified 12/01/2020, 12/18/2021   Moderna Covid-19 Fall Seasonal Vaccine 2yrs & older 12/05/2022   PFIZER(Purple Top)SARS-COV-2 Vaccination 05/08/2019, 06/02/2019, 02/13/2020, 07/22/2020   PNEUMOCOCCAL CONJUGATE-20 08/08/2021   Pfizer Covid-19 Vaccine Bivalent Booster 48yrs & up 03/20/2021    Pfizer(Comirnaty )Fall Seasonal Vaccine 12 years and older 03/20/2022   Tdap 09/22/2013   Zoster Recombinant(Shingrix ) 01/19/2022   Zoster, Live 07/01/2016     Objective: Vital Signs: BP (!) 172/92 (BP Location: Right Arm, Patient Position: Sitting, Cuff Size: Normal)   Pulse 62   Resp 14   Ht 5' 1 (1.549 m)   Wt 161 lb (73 kg)   BMI 30.42 kg/m    Physical Exam Vitals and nursing note reviewed.  Constitutional:      Appearance: She is well-developed.  HENT:     Head: Normocephalic and atraumatic.  Eyes:     Conjunctiva/sclera: Conjunctivae normal.  Cardiovascular:     Rate and Rhythm: Normal rate and regular rhythm.     Heart sounds: Normal heart sounds.  Pulmonary:     Effort: Pulmonary effort is normal.     Breath sounds: Normal breath sounds.  Abdominal:     General: Bowel sounds are normal.     Palpations: Abdomen is soft.  Musculoskeletal:     Cervical back: Normal range of motion.  Lymphadenopathy:     Cervical: No cervical adenopathy.  Skin:    General: Skin is warm and dry.     Capillary Refill: Capillary refill takes less than 2 seconds.  Neurological:     Mental Status: She is alert and oriented to person, place, and time.  Psychiatric:        Behavior: Behavior normal.      Musculoskeletal Exam: Cervical spine was in good range of motion.  Thoracic and lumbar spine 1 good range of motion with discomfort on range of motion of her lumbar spine.  Shoulder joints, elbow joints, wrist joints with good range of motion.  She had tenderness of her PIP and DIP joints with normal synovitis.  Had painful range of motion of her left hip which was limited.  Right hip joint was in good range of motion.  Bilateral knee joints were in good range of motion without any warmth swelling or effusion.  There was no tenderness over ankles or MTPs.  She had no muscular tenderness of weakness on the examination.  CDAI Exam: CDAI Score: -- Patient Global: --; Provider Global:  -- Swollen: --; Tender: -- Joint Exam 04/10/2023   No joint exam has been documented for this visit   There is currently no information documented on the homunculus. Go to the Rheumatology activity and complete the homunculus joint exam.  Investigation: No additional findings.  Imaging: MR LUMBAR SPINE WO CONTRAST Result Date: 03/30/2023 CLINICAL DATA:  Chronic low back pain radiating down the left side EXAM: MRI LUMBAR SPINE WITHOUT CONTRAST TECHNIQUE: Multiplanar, multisequence MR imaging of the lumbar spine was performed. No intravenous contrast was administered. COMPARISON:  07/07/2020 FINDINGS: Segmentation:  Standard. Alignment: Minimal grade 1 anterolisthesis of L4 on L5. Mild relative kyphosis at T11-12. Vertebrae: No acute fracture, evidence of discitis, or aggressive bone lesion. Conus medullaris and cauda equina: Conus extends to the L2 level. Conus and cauda equina appear normal. Paraspinal and other soft tissues: No acute paraspinal abnormality. Disc levels: Disc spaces: Degenerative disease with disc height loss at T9-10, T10-11, T11-12, T12-L1, L1-2, L2-3, L4-5 and L5-S1. Mild disc bulges on the sagittal images at T8-9, T9-10, T10-11 and T11-12. T12-L1: Mild disc bulge. Mild left and moderate right foraminal stenosis. No central canal stenosis. L1-L2: Mild disc bulge. Mild bilateral foraminal stenosis. No central canal stenosis. L2-L3: Mild disc bulge. Mild right and moderate left foraminal stenosis. Mild central canal stenosis. L3-L4: Moderate disc bulge. Mild bilateral facet arthropathy. Mild central canal stenosis. Moderate left and moderate-severe right foraminal stenosis. L4-L5: Moderate disc bulge flattening the ventral thecal sac. Mild bilateral facet arthropathy. Mild-moderate central canal stenosis. Severe bilateral foraminal stenosis. L5-S1: Mild disc bulge. Mild bilateral facet arthropathy. Severe bilateral foraminal stenosis, left worse than right. No central canal stenosis.  IMPRESSION: 1. Diffuse lumbar spine spondylosis as described above. 2. L4-L5: Moderate disc bulge flattening the ventral thecal sac. Mild bilateral facet arthropathy. Mild-moderate central canal stenosis. Severe bilateral foraminal stenosis. 3. L5-S1: Mild disc bulge. Mild bilateral facet arthropathy. Severe bilateral foraminal stenosis, left worse than right. No central canal stenosis. 4.  No acute osseous injury of the lumbar spine. Electronically Signed   By: Julaine Blanch M.D.   On: 03/30/2023 09:08    Recent Labs: Lab Results  Component Value Date   WBC 4.6  01/28/2023   HGB 13.0 01/28/2023   PLT 395 01/28/2023   NA 144 01/28/2023   K 4.5 01/28/2023   CL 101 01/28/2023   CO2 26 01/28/2023   GLUCOSE 95 01/28/2023   BUN 19 01/28/2023   CREATININE 1.05 (H) 01/28/2023   BILITOT 0.3 10/24/2022   ALKPHOS 77 10/24/2022   AST 24 10/24/2022   ALT 20 10/24/2022   PROT 6.8 10/24/2022   ALBUMIN 3.9 10/24/2022   CALCIUM  10.3 01/28/2023   GFRAA 73 12/31/2019   January 28, 2023 hemoglobin A1c 6.0, ANA positive, RF negative, anti-CCP negative, ESR 9, uric acid 7.5  October 24, 2022 vitamin D64.2  Speciality Comments: No specialty comments available.  Procedures:  No procedures performed Allergies: Patient has no known allergies.   Assessment / Plan:     Visit Diagnoses: Bilateral hand pain -she complains of pain and discomfort in her bilateral hands for the last 1 year.  No swelling or synovitis was noted.  plan: XR Hand 2 View Left, XR Hand 2 View Right.  X-rays of bilateral hands obtained today were suggestive of osteoarthritis.  Joint protection muscle strengthening was discussed.  Patient is active.  She volunteers at the [facility] and also plays pickle ball at times.  Pain in left hip-patient had limited painful range of motion of the left hip.  XR HIP UNILAT W OR W/O PELVIS 2-3 VIEWS LEFT  X-rays of the hip joint showed osteoarthritis.  Chronic pain of left knee -she  complains of pain and discomfort in her left knee joint for the last 4 years.  Patient states she was evaluated by orthopedics in the past.  She denies having any cortisone injections.  No warmth swelling or effusion was noted.  Plan: XR KNEE 3 VIEW LEFT.  X-rays showed moderate chondromalacia patella.  Chronic left-sided low back pain with left-sided sciatica-she has been experiencing lower back pain for many years.  She has had cortisone injections in the past.  She had MRI of the lumbar spine by Dr. Jarold in December 2024 which was reviewed with the patient.  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  She also has significant facet joint arthropathy.  She would benefit by seeing a spine specialist and also from physical therapy.  Patient states she has been going to a chiropractor for 1-1/2-year without any response.  She gives history of left-sided radiculopathy.  Positive ANA (antinuclear antibody) -she has positive ANA.  No titer was given.  She gives history of fatigue and hair loss.  I will obtain additional labs.  Plan: ANA, RNP Antibody, Anti-scleroderma antibody, Anti-Smith antibody, Sjogrens syndrome-A extractable nuclear antibody, Sjogrens syndrome-B extractable nuclear antibody, Anti-DNA antibody, double-stranded, C3 and C4  Myalgia -she gives history of myalgias since childhood.  No muscular weakness or tenderness was noted.  Plan: CK  Essential hypertension-blood pressure was elevated at 172/92.  Patient was advised to monitor blood pressure closely and follow-up with her PCP.  Other medical problems listed as follows:  Mixed hyperlipidemia-patient is on Lipitor.  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  Microscopic hematuria  Osteopenia of multiple sites - DEXA scan December 15, 2020  Vitamin D  deficiency-she is on vitamin D  supplement.  Prediabetes  SOB (shortness of breath) on  exertion  Mood disorder (HCC)- emotional eating  Sleep disturbance  Orders: Orders Placed This Encounter  Procedures   XR Hand 2 View Left   XR KNEE 3 VIEW  LEFT   XR Hand 2 View Right   XR HIP UNILAT W OR W/O PELVIS 2-3 VIEWS LEFT   ANA   RNP Antibody   Anti-scleroderma antibody   Anti-Smith antibody   Sjogrens syndrome-A extractable nuclear antibody   Sjogrens syndrome-B extractable nuclear antibody   Anti-DNA antibody, double-stranded   C3 and C4   CK   No orders of the defined types were placed in this encounter.   Face-to-face time spent with patient was 45 minutes. Greater than 50% of time was spent in counseling and coordination of care.  Follow-Up Instructions: Return for Polyarthralgia.   Maya Nash, MD  Note - This record has been created using Animal nutritionist.  Chart creation errors have been sought, but may not always  have been located. Such creation errors do not reflect on  the standard of medical care.

## 2023-03-31 ENCOUNTER — Encounter: Payer: Self-pay | Admitting: Internal Medicine

## 2023-04-01 ENCOUNTER — Encounter: Payer: Self-pay | Admitting: Internal Medicine

## 2023-04-02 ENCOUNTER — Encounter (INDEPENDENT_AMBULATORY_CARE_PROVIDER_SITE_OTHER): Payer: Self-pay | Admitting: Family Medicine

## 2023-04-03 ENCOUNTER — Other Ambulatory Visit (INDEPENDENT_AMBULATORY_CARE_PROVIDER_SITE_OTHER): Payer: Self-pay

## 2023-04-04 NOTE — Telephone Encounter (Signed)
 Please address.

## 2023-04-09 ENCOUNTER — Ambulatory Visit (INDEPENDENT_AMBULATORY_CARE_PROVIDER_SITE_OTHER): Payer: Medicare HMO | Admitting: Family Medicine

## 2023-04-10 ENCOUNTER — Ambulatory Visit: Payer: Medicare HMO

## 2023-04-10 ENCOUNTER — Ambulatory Visit: Payer: Medicare HMO | Attending: Rheumatology | Admitting: Rheumatology

## 2023-04-10 ENCOUNTER — Encounter: Payer: Self-pay | Admitting: Rheumatology

## 2023-04-10 VITALS — BP 172/92 | HR 62 | Resp 14 | Ht 61.0 in | Wt 161.0 lb

## 2023-04-10 DIAGNOSIS — M79641 Pain in right hand: Secondary | ICD-10-CM

## 2023-04-10 DIAGNOSIS — M8589 Other specified disorders of bone density and structure, multiple sites: Secondary | ICD-10-CM

## 2023-04-10 DIAGNOSIS — R0789 Other chest pain: Secondary | ICD-10-CM

## 2023-04-10 DIAGNOSIS — R0602 Shortness of breath: Secondary | ICD-10-CM

## 2023-04-10 DIAGNOSIS — R768 Other specified abnormal immunological findings in serum: Secondary | ICD-10-CM

## 2023-04-10 DIAGNOSIS — M25562 Pain in left knee: Secondary | ICD-10-CM

## 2023-04-10 DIAGNOSIS — M79642 Pain in left hand: Secondary | ICD-10-CM

## 2023-04-10 DIAGNOSIS — L659 Nonscarring hair loss, unspecified: Secondary | ICD-10-CM | POA: Diagnosis not present

## 2023-04-10 DIAGNOSIS — I10 Essential (primary) hypertension: Secondary | ICD-10-CM | POA: Diagnosis not present

## 2023-04-10 DIAGNOSIS — I129 Hypertensive chronic kidney disease with stage 1 through stage 4 chronic kidney disease, or unspecified chronic kidney disease: Secondary | ICD-10-CM

## 2023-04-10 DIAGNOSIS — E782 Mixed hyperlipidemia: Secondary | ICD-10-CM

## 2023-04-10 DIAGNOSIS — F39 Unspecified mood [affective] disorder: Secondary | ICD-10-CM

## 2023-04-10 DIAGNOSIS — G8929 Other chronic pain: Secondary | ICD-10-CM

## 2023-04-10 DIAGNOSIS — G479 Sleep disorder, unspecified: Secondary | ICD-10-CM

## 2023-04-10 DIAGNOSIS — M25552 Pain in left hip: Secondary | ICD-10-CM | POA: Diagnosis not present

## 2023-04-10 DIAGNOSIS — E559 Vitamin D deficiency, unspecified: Secondary | ICD-10-CM

## 2023-04-10 DIAGNOSIS — R3129 Other microscopic hematuria: Secondary | ICD-10-CM

## 2023-04-10 DIAGNOSIS — R7303 Prediabetes: Secondary | ICD-10-CM

## 2023-04-10 DIAGNOSIS — R7689 Other specified abnormal immunological findings in serum: Secondary | ICD-10-CM

## 2023-04-10 DIAGNOSIS — M791 Myalgia, unspecified site: Secondary | ICD-10-CM

## 2023-04-10 DIAGNOSIS — N182 Chronic kidney disease, stage 2 (mild): Secondary | ICD-10-CM

## 2023-04-10 DIAGNOSIS — M5442 Lumbago with sciatica, left side: Secondary | ICD-10-CM | POA: Diagnosis not present

## 2023-04-10 NOTE — Patient Instructions (Signed)
 Hand Exercises Hand exercises can be helpful for almost anyone. They can strengthen your hands and improve flexibility and movement. The exercises can also increase blood flow to the hands. These results can make your work and daily tasks easier for you. Hand exercises can be especially helpful for people who have joint pain from arthritis or nerve damage from using their hands over and over. These exercises can also help people who injure a hand. Exercises Most of these hand exercises are gentle stretching and motion exercises. It is usually safe to do them often throughout the day. Warming up your hands before exercise may help reduce stiffness. You can do this with gentle massage or by placing your hands in warm water for 10-15 minutes. It is normal to feel some stretching, pulling, tightness, or mild discomfort when you begin new exercises. In time, this will improve. Remember to always be careful and stop right away if you feel sudden, very bad pain or your pain gets worse. You want to get better and be safe. Ask your health care provider which exercises are safe for you. Do exercises exactly as told by your provider and adjust them as told. Do not begin these exercises until told by your provider. Knuckle bend or "claw" fist  Stand or sit with your arm, hand, and all five fingers pointed straight up. Make sure to keep your wrist straight. Gently bend your fingers down toward your palm until the tips of your fingers are touching your palm. Keep your big knuckle straight and only bend the small knuckles in your fingers. Hold this position for 10 seconds. Straighten your fingers back to your starting position. Repeat this exercise 5-10 times with each hand. Full finger fist  Stand or sit with your arm, hand, and all five fingers pointed straight up. Make sure to keep your wrist straight. Gently bend your fingers into your palm until the tips of your fingers are touching the middle of your  palm. Hold this position for 10 seconds. Extend your fingers back to your starting position, stretching every joint fully. Repeat this exercise 5-10 times with each hand. Straight fist  Stand or sit with your arm, hand, and all five fingers pointed straight up. Make sure to keep your wrist straight. Gently bend your fingers at the big knuckle, where your fingers meet your hand, and at the middle knuckle. Keep the knuckle at the tips of your fingers straight and try to touch the bottom of your palm. Hold this position for 10 seconds. Extend your fingers back to your starting position, stretching every joint fully. Repeat this exercise 5-10 times with each hand. Tabletop  Stand or sit with your arm, hand, and all five fingers pointed straight up. Make sure to keep your wrist straight. Gently bend your fingers at the big knuckle, where your fingers meet your hand, as far down as you can. Keep the small knuckles in your fingers straight. Think of forming a tabletop with your fingers. Hold this position for 10 seconds. Extend your fingers back to your starting position, stretching every joint fully. Repeat this exercise 5-10 times with each hand. Finger spread  Place your hand flat on a table with your palm facing down. Make sure your wrist stays straight. Spread your fingers and thumb apart from each other as far as you can until you feel a gentle stretch. Hold this position for 10 seconds. Bring your fingers and thumb tight together again. Hold this position for 10 seconds. Repeat  this exercise 5-10 times with each hand. Making circles  Stand or sit with your arm, hand, and all five fingers pointed straight up. Make sure to keep your wrist straight. Make a circle by touching the tip of your thumb to the tip of your index finger. Hold for 10 seconds. Then open your hand wide. Repeat this motion with your thumb and each of your fingers. Repeat this exercise 5-10 times with each hand. Thumb  motion  Sit with your forearm resting on a table and your wrist straight. Your thumb should be facing up toward the ceiling. Keep your fingers relaxed as you move your thumb. Lift your thumb up as high as you can toward the ceiling. Hold for 10 seconds. Bend your thumb across your palm as far as you can, reaching the tip of your thumb for the small finger (pinkie) side of your palm. Hold for 10 seconds. Repeat this exercise 5-10 times with each hand. Grip strengthening  Hold a stress ball or other soft ball in the middle of your hand. Slowly increase the pressure, squeezing the ball as much as you can without causing pain. Think of bringing the tips of your fingers into the middle of your palm. All of your finger joints should bend when doing this exercise. Hold your squeeze for 10 seconds, then relax. Repeat this exercise 5-10 times with each hand. Contact a health care provider if: Your hand pain or discomfort gets much worse when you do an exercise. Your hand pain or discomfort does not improve within 2 hours after you exercise. If you have either of these problems, stop doing these exercises right away. Do not do them again unless your provider says that you can. Get help right away if: You develop sudden, severe hand pain or swelling. If this happens, stop doing these exercises right away. Do not do them again unless your provider says that you can. This information is not intended to replace advice given to you by your health care provider. Make sure you discuss any questions you have with your health care provider. Document Revised: 04/03/2022 Document Reviewed: 04/03/2022 [employer] Patient Education  2024 [employer] Low Back Sprain or Strain Rehab Ask your health care provider which exercises are safe for you. Do exercises exactly as told by your health care provider and adjust them as directed. It is normal to feel mild stretching, pulling, tightness, or discomfort as you do these  exercises. Stop right away if you feel sudden pain or your pain gets worse. Do not begin these exercises until told by your health care provider. Stretching and range-of-motion exercises These exercises warm up your muscles and joints and improve the movement and flexibility of your back. These exercises also help to relieve pain, numbness, and tingling. Lumbar rotation  Lie on your back on a firm bed or the floor with your knees bent. Straighten your arms out to your sides so each arm forms a 90-degree angle (right angle) with a side of your body. Slowly move (rotate) both of your knees to one side of your body until you feel a stretch in your lower back (lumbar). Try not to let your shoulders lift off the floor. Hold this position for __________ seconds. Tense your abdominal muscles and slowly move your knees back to the starting position. Repeat this exercise on the other side of your body. Repeat __________ times. Complete this exercise __________ times a day. Single knee to chest  Lie on your back on a  firm bed or the floor with both legs straight. Bend one of your knees. Use your hands to move your knee up toward your chest until you feel a gentle stretch in your lower back and buttock. Hold your leg in this position by holding on to the front of your knee. Keep your other leg as straight as possible. Hold this position for __________ seconds. Slowly return to the starting position. Repeat with your other leg. Repeat __________ times. Complete this exercise __________ times a day. Prone extension on elbows  Lie on your abdomen on a firm bed or the floor (prone position). Prop yourself up on your elbows. Use your arms to help lift your chest up until you feel a gentle stretch in your abdomen and your lower back. This will place some of your body weight on your elbows. If this is uncomfortable, try stacking pillows under your chest. Your hips should stay down, against the surface that  you are lying on. Keep your hip and back muscles relaxed. Hold this position for __________ seconds. Slowly relax your upper body and return to the starting position. Repeat __________ times. Complete this exercise __________ times a day. Strengthening exercises These exercises build strength and endurance in your back. Endurance is the ability to use your muscles for a long time, even after they get tired. Pelvic tilt This exercise strengthens the muscles that lie deep in the abdomen. Lie on your back on a firm bed or the floor with your legs extended. Bend your knees so they are pointing toward the ceiling and your feet are flat on the floor. Tighten your lower abdominal muscles to press your lower back against the floor. This motion will tilt your pelvis so your tailbone points up toward the ceiling instead of pointing to your feet or the floor. To help with this exercise, you may place a small towel under your lower back and try to push your back into the towel. Hold this position for __________ seconds. Let your muscles relax completely before you repeat this exercise. Repeat __________ times. Complete this exercise __________ times a day. Alternating arm and leg raises  Get on your hands and knees on a firm surface. If you are on a hard floor, you may want to use padding, such as an exercise mat, to cushion your knees. Line up your arms and legs. Your hands should be directly below your shoulders, and your knees should be directly below your hips. Lift your left leg behind you. At the same time, raise your right arm and straighten it in front of you. Do not lift your leg higher than your hip. Do not lift your arm higher than your shoulder. Keep your abdominal and back muscles tight. Keep your hips facing the ground. Do not arch your back. Keep your balance carefully, and do not hold your breath. Hold this position for __________ seconds. Slowly return to the starting  position. Repeat with your right leg and your left arm. Repeat __________ times. Complete this exercise __________ times a day. Abdominal set with straight leg raise  Lie on your back on a firm bed or the floor. Bend one of your knees and keep your other leg straight. Tense your abdominal muscles and lift your straight leg up, 4-6 inches (10-15 cm) off the ground. Keep your abdominal muscles tight and hold this position for __________ seconds. Do not hold your breath. Do not arch your back. Keep it flat against the ground. Keep your abdominal muscles  tense as you slowly lower your leg back to the starting position. Repeat with your other leg. Repeat __________ times. Complete this exercise __________ times a day. Single leg lower with bent knees Lie on your back on a firm bed or the floor. Tense your abdominal muscles and lift your feet off the floor, one foot at a time, so your knees and hips are bent in 90-degree angles (right angles). Your knees should be over your hips and your lower legs should be parallel to the floor. Keeping your abdominal muscles tense and your knee bent, slowly lower one of your legs so your toe touches the ground. Lift your leg back up to return to the starting position. Do not hold your breath. Do not let your back arch. Keep your back flat against the ground. Repeat with your other leg. Repeat __________ times. Complete this exercise __________ times a day. Posture and body mechanics Good posture and healthy body mechanics can help to relieve stress in your body's tissues and joints. Body mechanics refers to the movements and positions of your body while you do your daily activities. Posture is part of body mechanics. Good posture means: Your spine is in its natural S-curve position (neutral). Your shoulders are pulled back slightly. Your head is not tipped forward (neutral). Follow these guidelines to improve your posture and body mechanics in your everyday  activities. Standing  When standing, keep your spine neutral and your feet about hip-width apart. Keep a slight bend in your knees. Your ears, shoulders, and hips should line up. When you do a task in which you stand in one place for a long time, place one foot up on a stable object that is 2-4 inches (5-10 cm) high, such as a footstool. This helps keep your spine neutral. Sitting  When sitting, keep your spine neutral and keep your feet flat on the floor. Use a footrest, if necessary, and keep your thighs parallel to the floor. Avoid rounding your shoulders, and avoid tilting your head forward. When working at a desk or a computer, keep your desk at a height where your hands are slightly lower than your elbows. Slide your chair under your desk so you are close enough to maintain good posture. When working at a computer, place your monitor at a height where you are looking straight ahead and you do not have to tilt your head forward or downward to look at the screen. Resting When lying down and resting, avoid positions that are most painful for you. If you have pain with activities such as sitting, bending, stooping, or squatting, lie in a position in which your body does not bend very much. For example, avoid curling up on your side with your arms and knees near your chest (fetal position). If you have pain with activities such as standing for a long time or reaching with your arms, lie with your spine in a neutral position and bend your knees slightly. Try the following positions: Lying on your side with a pillow between your knees. Lying on your back with a pillow under your knees. Lifting  When lifting objects, keep your feet at least shoulder-width apart and tighten your abdominal muscles. Bend your knees and hips and keep your spine neutral. It is important to lift using the strength of your legs, not your back. Do not lock your knees straight out. Always ask for help to lift heavy or  awkward objects. This information is not intended to replace advice given  to you by your health care provider. Make sure you discuss any questions you have with your health care provider. Document Revised: 07/23/2022 Document Reviewed: 06/06/2020 [employer] Patient Education  2024 [employer] Exercises for Chronic Knee Pain Chronic knee pain is pain that lasts longer than 3 months. For most people with chronic knee pain, exercise and weight loss is an important part of treatment. Your health care provider may want you to focus on: Making the muscles that support your knee stronger. This can take pressure off your knee and reduce pain. Preventing knee stiffness. How far you can move your knee, keeping it there or making it farther. Losing weight (if this applies) to take pressure off your knee, lower your risk for injury, and make it easier for you to exercise. Your provider will help you make an exercise program that fits your needs and physical abilities. Below are simple, low-impact exercises you can do at home. Ask your provider or physical therapist how often you should do your exercise program and how many times to repeat each exercise. General safety tips  Get your provider's approval before doing any exercises. Start slowly and stop any time you feel pain. Do not exercise if your knee pain is flaring up. Warm up first. Stretching a cold muscle can cause an injury. Do 5-10 minutes of easy movement or light stretching before beginning your exercises. Do 5-10 minutes of low-impact activity (like walking or cycling) before starting strengthening exercises. Contact your provider any time you have pain during or after exercising. Exercise can cause discomfort but should not be painful. It is normal to be a little stiff or sore after exercising. Stretching and range-of-motion exercises Front thigh stretch  Stand up straight and support your body by holding on to a chair or resting one hand on  a wall. With your legs straight and close together, bend one knee to lift your heel up toward your butt. Using one hand for support, grab your ankle with your free hand. Pull your foot up closer toward your butt to feel the stretch in front of your thigh. Hold the stretch for 30 seconds. Repeat __________ times. Complete this exercise __________ times a day. Back thigh stretch  Sit on the floor with your back straight and your legs out straight in front of you. Place the palms of your hands on the floor and slide them toward your feet as you bend at the hip. Try to touch your nose to your knees and feel the stretch in the back of your thighs. Hold for 30 seconds. Repeat __________ times. Complete this exercise __________ times a day. Calf stretch  Stand facing a wall. Place the palms of your hands flat against the wall, arms extended, and lean slightly against the wall. Get into a lunge position with one leg bent at the knee and the other leg stretched out straight behind you. Keep both feet facing the wall and increase the bend in your knee while keeping the heel of the other leg flat on the ground. You should feel the stretch in your calf. Hold for 30 seconds. Repeat __________ times. Complete this exercise __________ times a day. Strengthening exercises Straight leg lift  Lie on your back with one knee bent and the other leg out straight. Slowly lift the straight leg without bending the knee. Lift until your foot is about 12 inches (30 cm) off the floor. Hold for 3-5 seconds and slowly lower your leg. Repeat __________ times. Complete this  exercise __________ times a day. Single leg dip  Stand between two chairs and put both hands on the backs of the chairs for support. Extend one leg out straight with your body weight resting on the heel of the standing leg. Slowly bend your standing knee to dip your body to the level that is comfortable for you. Hold for 3-5 seconds. Repeat  __________ times. Complete this exercise __________ times a day. Hamstring curls  Stand straight, knees close together, facing the back of a chair. Hold on to the back of a chair with both hands. Keep one leg straight. Bend the other knee while bringing the heel up toward the butt until the knee is bent at a 90-degree angle (right angle). Hold for 3-5 seconds. Repeat __________ times. Complete this exercise __________ times a day. Wall squat  Stand straight with your back, hips, and head against a wall. Step forward one foot at a time with your back still against the wall. Your feet should be 2 feet (61 cm) from the wall at shoulder width. Keeping your back, hips, and head against the wall, slide down the wall to as close to a sitting position as you can get. Hold for 5-10 seconds, then slowly slide back up. Repeat __________ times. Complete this exercise __________ times a day. Step-ups  Stand in front of a sturdy platform or stool that is about 6 inches (15 cm) high. Slowly step up with your left / right foot, keeping your knee in line with your hip and foot. Do not let your knee bend so far that you cannot see your toes. Hold on to a chair for balance, but do not use it for support. Slowly unlock your knee and lower yourself to the starting position. Repeat __________ times. Complete this exercise __________ times a day. Contact a health care provider if: Your exercises cause pain. Your pain is worse after you exercise. Your pain prevents you from doing your exercises. This information is not intended to replace advice given to you by your health care provider. Make sure you discuss any questions you have with your health care provider. Document Revised: 04/03/2022 Document Reviewed: 04/03/2022 [employer] Patient Education  2024 [employer].

## 2023-04-12 NOTE — Progress Notes (Signed)
 ENA panel negative, complements normal, CK normal, ANA titer is pending.  I will discuss results at the follow-up visit.

## 2023-04-14 LAB — ANA: Anti Nuclear Antibody (ANA): POSITIVE — AB

## 2023-04-14 LAB — CK: Total CK: 98 U/L (ref 29–143)

## 2023-04-14 LAB — ANTI-SMITH ANTIBODY: ENA SM Ab Ser-aCnc: <1 AI

## 2023-04-14 LAB — ANTI-NUCLEAR AB-TITER (ANA TITER): ANA Titer 1: 1:80 {titer} — ABNORMAL HIGH

## 2023-04-14 LAB — ANTI-DNA ANTIBODY, DOUBLE-STRANDED: ds DNA Ab: <1 [IU]/mL

## 2023-04-14 LAB — SJOGRENS SYNDROME-B EXTRACTABLE NUCLEAR ANTIBODY: SSB (La) (ENA) Antibody, IgG: <1 AI

## 2023-04-14 LAB — ANTI-SCLERODERMA ANTIBODY: Scleroderma (Scl-70) (ENA) Antibody, IgG: <1 AI

## 2023-04-14 LAB — C3 AND C4
C3 Complement: 159 mg/dL (ref 83–193)
C4 Complement: 25 mg/dL (ref 15–57)

## 2023-04-14 LAB — RNP ANTIBODY: Ribonucleic Protein(ENA) Antibody, IgG: <1 AI

## 2023-04-14 LAB — SJOGRENS SYNDROME-A EXTRACTABLE NUCLEAR ANTIBODY: SSA (Ro) (ENA) Antibody, IgG: <1 AI

## 2023-04-17 DIAGNOSIS — M5416 Radiculopathy, lumbar region: Secondary | ICD-10-CM | POA: Diagnosis not present

## 2023-04-17 DIAGNOSIS — M48062 Spinal stenosis, lumbar region with neurogenic claudication: Secondary | ICD-10-CM | POA: Diagnosis not present

## 2023-04-22 DIAGNOSIS — M5416 Radiculopathy, lumbar region: Secondary | ICD-10-CM | POA: Diagnosis not present

## 2023-04-22 DIAGNOSIS — M5451 Vertebrogenic low back pain: Secondary | ICD-10-CM | POA: Diagnosis not present

## 2023-04-23 ENCOUNTER — Other Ambulatory Visit (INDEPENDENT_AMBULATORY_CARE_PROVIDER_SITE_OTHER): Payer: Self-pay | Admitting: Family Medicine

## 2023-04-23 DIAGNOSIS — F39 Unspecified mood [affective] disorder: Secondary | ICD-10-CM

## 2023-04-25 NOTE — Progress Notes (Signed)
 Office Visit Note  Patient: Sara Obrien             Date of Birth: Jan 02, 1946           MRN: [id number]             PCP: Jarold Medici, MD Referring: Jarold Medici, MD Visit Date: 05/09/2023 Occupation: @GUAROCC @  Subjective:  Pain in multiple joints  History of Present Illness: Sara Obrien is a 78 y.o. female returns today after her initial visit on April 10, 2023 due to ongoing joint pain, muscle pain and positive ANA.  Patient states that she continues to have pain and stiffness in her bilateral hands, bilateral knees and her hips.  She also continues to have some discomfort in her lower back.  She denies any history of oral ulcers, nasal ulcers, malar rash, photosensitivity, Raynaud's, lymphadenopathy or inflammatory arthritis.  She continues to have some muscle pain.    Activities of Daily Living:  Patient reports morning stiffness for 15 minutes.   Patient Reports nocturnal pain.  Difficulty dressing/grooming: Denies Difficulty climbing stairs: Denies Difficulty getting out of chair: Denies Difficulty using hands for taps, buttons, cutlery, and/or writing: Denies  Review of Systems  Constitutional:  Negative for fatigue.  HENT:  Negative for mouth sores, mouth dryness and nose dryness.   Eyes:  Negative for pain and dryness.  Respiratory:  Negative for shortness of breath and difficulty breathing.   Cardiovascular:  Negative for chest pain and palpitations.  Gastrointestinal:  Negative for blood in stool, constipation and diarrhea.  Endocrine: Negative for increased urination.  Genitourinary:  Negative for involuntary urination.  Musculoskeletal:  Positive for joint pain, joint pain, joint swelling and morning stiffness. Negative for gait problem, myalgias, muscle weakness, muscle tenderness and myalgias.  Skin:  Negative for color change, rash, hair loss and sensitivity to sunlight.  Allergic/Immunologic: Negative for susceptible to infections.  Neurological:   Negative for dizziness and headaches.  Hematological:  Negative for swollen glands.  Psychiatric/Behavioral:  Positive for depressed mood. Negative for sleep disturbance. The patient is nervous/anxious.     PMFS History:  Patient Active Problem List   Diagnosis Date Noted   COVID 04/30/2023   Other post infection and related fatigue syndromes 04/30/2023   Chronic left-sided low back pain with left-sided sciatica 03/13/2023   Chronic pain of left knee 03/13/2023   Annual physical exam 01/28/2023   Bilateral hand pain 01/28/2023   Chest wall tenderness 01/28/2023   Paresthesia and pain of left extremity 07/23/2022   Elevated vitamin B12 level 06/26/2022   Vitamin D  deficiency 06/26/2022   Other fatigue 06/12/2022   SOB (shortness of breath) on exertion 06/12/2022   Class 1 obesity with serious comorbidity and body mass index (BMI) of 31.0 to 31.9 in adult 06/12/2022   Hypertension due to endocrine disorder 06/12/2022   Generalized OA 06/12/2022   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/12/2022   Sleep disturbance 06/12/2022   Prediabetes 05/28/2022   Generalized obesity 05/28/2022   Acute non-recurrent maxillary sinusitis 07/18/2021   Myalgia 01/10/2021   Microscopic hematuria 01/10/2021   Other abnormal glucose 01/10/2021   Hypertensive nephropathy 12/31/2019   Chronic renal disease, stage II 12/31/2019   Vitamin D  deficiency disease 12/31/2019   Class 1 obesity due to excess calories with serious comorbidity and body mass index (BMI) of 30.0 to 30.9 in adult 12/31/2019   Atypical chest pain 07/01/2017   Essential hypertension 07/01/2017   Hyperlipidemia 07/01/2017  Past Medical History:  Diagnosis Date   Anxiety    Atypical chest pain 07/01/2017   Constipation    Depression    Essential hypertension 07/01/2017   Gum disease    dentures worn   History of swelling of feet    Hyperlipidemia    Hypertension    Knee pain    Low back pain    Migraines    Overweight      Family History  Problem Relation Age of Onset   Brain cancer Mother    Hypertension Mother    Diabetes Mother    High Cholesterol Mother    Heart disease Mother    Stroke Mother    Depression Mother    Stroke Father    Hypertension Father    Heart disease Father    Sudden death Father    Hypertension Sister    Breast cancer Sister    Cancer Brother    Cancer Brother    Cancer Brother    Past Surgical History:  Procedure Laterality Date   BREAST LUMPECTOMY     TUBAL LIGATION     Social History   Social History Narrative   Not on file   Immunization History  Administered Date(s) Administered   Fluad Quad(high Dose 65+) 12/10/2019, 12/28/2022   Influenza, High Dose Seasonal PF 12/17/2018, 12/13/2020   Influenza-Unspecified 12/01/2020, 12/18/2021   Moderna Covid-19 Fall Seasonal Vaccine 71yrs & older 12/05/2022   PFIZER(Purple Top)SARS-COV-2 Vaccination 05/08/2019, 06/02/2019, 02/13/2020, 07/22/2020   PNEUMOCOCCAL CONJUGATE-20 08/08/2021   Pfizer Covid-19 Vaccine Bivalent Booster 52yrs & up 03/20/2021   Pfizer(Comirnaty )Fall Seasonal Vaccine 12 years and older 03/20/2022   Tdap 09/22/2013   Zoster Recombinant(Shingrix ) 01/19/2022   Zoster, Live 07/01/2016     Objective: Vital Signs: BP 112/72 (BP Location: Left Arm, Patient Position: Sitting, Cuff Size: Normal)   Pulse 69   Resp 13   Ht 5' (1.524 m)   Wt 156 lb (70.8 kg)   BMI 30.47 kg/m    Physical Exam Vitals and nursing note reviewed.  Constitutional:      Appearance: She is well-developed.  HENT:     Head: Normocephalic and atraumatic.  Eyes:     Conjunctiva/sclera: Conjunctivae normal.  Cardiovascular:     Rate and Rhythm: Normal rate and regular rhythm.     Heart sounds: Normal heart sounds.  Pulmonary:     Effort: Pulmonary effort is normal.     Breath sounds: Normal breath sounds.  Abdominal:     General: Bowel sounds are normal.     Palpations: Abdomen is soft.  Musculoskeletal:      Cervical back: Normal range of motion.  Lymphadenopathy:     Cervical: No cervical adenopathy.  Skin:    General: Skin is warm and dry.     Capillary Refill: Capillary refill takes less than 2 seconds.  Neurological:     Mental Status: She is alert and oriented to person, place, and time.  Psychiatric:        Behavior: Behavior normal.      Musculoskeletal Exam: Cervical, thoracic and lumbar spine were in good range of motion.  Shoulders, elbows, wrist joints, MCPs PIPs and DIPs with good range of motion with no synovitis.  She had limited range of motion of bilateral hip joints with minimal discomfort.  Knee joints were in good range of motion without any warmth swelling or effusion.  There was no tenderness over ankles or MTPs.  CDAI Exam: CDAI Score: -- Patient  Global: --; Provider Global: -- Swollen: --; Tender: -- Joint Exam 05/09/2023   No joint exam has been documented for this visit   There is currently no information documented on the homunculus. Go to the Rheumatology activity and complete the homunculus joint exam.  Investigation: No additional findings.  Imaging: XR HIP UNILAT W OR W/O PELVIS 2-3 VIEWS LEFT Result Date: 04/10/2023 Moderate narrowing of bilateral hip joints was noted.  No SI joint sclerosis or narrowing was noted.  No chondrocalcinosis was noted.  Degenerative changes were noted in the lumbar spine. Impression: These findings are suggestive of osteoarthritis of the hip joint.  XR KNEE 3 VIEW LEFT Result Date: 04/10/2023 No medial or lateral compartment narrowing was noted.  Moderate patellofemoral narrowing was noted. 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  XR Hand 2 View Left Result Date: 04/10/2023 CMC, PIP and DIP narrowing was noted.  Cystic changes were noted over third metatarsal head.  No intercarpal or radiocarpal joint space narrowing was noted.  No erosive changes were noted. Impression: These  findings are suggestive of osteoarthritis of the hand.  XR Hand 2 View Right Result Date: 04/10/2023 CMC, PIP and DIP narrowing was noted.  No MCP, intercarpal radiocarpal joint space narrowing was noted.  No erosive changes were noted. Impression: These findings suggestive of osteoarthritis of the hand.   Recent Labs: Lab Results  Component Value Date   WBC 4.6 01/28/2023   HGB 13.0 01/28/2023   PLT 395 01/28/2023   NA 144 01/28/2023   K 4.5 01/28/2023   CL 101 01/28/2023   CO2 26 01/28/2023   GLUCOSE 95 01/28/2023   BUN 19 01/28/2023   CREATININE 1.05 (H) 01/28/2023   BILITOT 0.3 10/24/2022   ALKPHOS 77 10/24/2022   AST 24 10/24/2022   ALT 20 10/24/2022   PROT 6.8 10/24/2022   ALBUMIN 3.9 10/24/2022   CALCIUM  10.3 01/28/2023   GFRAA 73 12/31/2019   April 10, 2023 ANA 1: 80 NH, ENA (RNP, Smith, SCL 70, SSA, SSB, dsDNA) negative, C3-C4 normal, CK 98  Speciality Comments: No specialty comments available.  Procedures:  No procedures performed Allergies: Patient has no known allergies.   Assessment / Plan:     Visit Diagnoses: Bilateral hand pain -patient continues to have pain and stiffness in her hands.  No synovitis was noted.  Bilateral mild PIP and DIP thickening was noted.  Clinical and radiographic findings suggestive of osteoarthritis.  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  Joint protection muscle strengthening was discussed.  I offered physical therapy which she declined.  A handout on hand exercises was given.  Pain in left hip -patient had limited range of motion of bilateral hip joints.  She does not have much discomfort with range of motion.  X-rays obtained at the last visit showed osteoarthritic changes.  X-ray findings were reviewed with the patient.  Chronic pain of left knee -she continues to have discomfort in her knee joints.  No warmth swelling or effusion was noted.  History of knee joint pain for the last 4 years.  X-rays obtained at the  last visit showed moderate chondromalacia patella.  X-ray findings were reviewed with the patient.  Joint protection muscle strengthening was discussed.  A handout on knee exercises was given.  Patient declined physical therapy.  Chronic left-sided low back pain with left-sided sciatica - Previous MRI of the lumbar spine December 2024 showed multilevel spondylosis, facet joint arthropathy and mild to  moderate spinal stenosis.  She has intermittent discomfort in her lower back.  She denies any radiculopathy today.  We gave her a handout on back exercises.  She declined physical therapy.  Positive ANA (antinuclear antibody) - ANA low titer positive, ENA negative, complements normal.  Patient gives history of fatigue and hair loss.  Lab results were discussed with patient.  She does not have any clinical features of autoimmune disease.  Myalgia - History of myalgias since childhood.  No muscular pains or tenderness was noted.  CK was within normal limits.  Other medical problems are listed as follows:  Hair loss  Mixed hyperlipidemia  Essential hypertension  Hypertensive nephropathy  Chronic renal disease, stage II  Microscopic hematuria  Prediabetes  Osteopenia of multiple sites  Vitamin D  deficiency  Mood disorder (HCC)- emotional eating  SOB (shortness of breath) on exertion  Sleep disturbance  Orders: No orders of the defined types were placed in this encounter.  No orders of the defined types were placed in this encounter.    Follow-Up Instructions: Return in about 1 year (around 05/08/2024) for Osteoarthritis.   Maya Nash, MD  Note - This record has been created using Animal nutritionist.  Chart creation errors have been sought, but may not always  have been located. Such creation errors do not reflect on  the standard of medical care.

## 2023-04-30 ENCOUNTER — Telehealth: Payer: Medicare HMO | Admitting: Internal Medicine

## 2023-04-30 ENCOUNTER — Encounter (INDEPENDENT_AMBULATORY_CARE_PROVIDER_SITE_OTHER): Payer: Self-pay

## 2023-04-30 ENCOUNTER — Encounter: Payer: Self-pay | Admitting: Internal Medicine

## 2023-04-30 DIAGNOSIS — U071 COVID-19: Secondary | ICD-10-CM

## 2023-04-30 DIAGNOSIS — G9339 Other post infection and related fatigue syndromes: Secondary | ICD-10-CM | POA: Diagnosis not present

## 2023-04-30 NOTE — Patient Instructions (Signed)
oscillococcinum  COVID-19 COVID-19 is an infection caused by a virus called SARS-CoV-2. This type of virus is called a coronavirus. People with COVID-19 may: Have little to no symptoms. Have mild to moderate symptoms that affect their lungs and breathing. Get very sick. What are the causes? COVID-19 is caused by a virus. This virus may be in the air as droplets or on surfaces. It can spread from an infected person when they cough, sneeze, speak, sing, or breathe. You may become infected if: You breathe in the infected droplets in the air. You touch an object that has the virus on it. What increases the risk? You are at risk of getting COVID-19 if you have been around someone with the infection. You may be more likely to get very sick if: You are 47 years old or older. You have certain medical conditions, such as: Heart disease. Diabetes. Chronic respiratory disease. Cancer. Pregnancy. You are immunocompromised. This means your body cannot fight infections easily. You have a disability or trouble moving, meaning you're immobile. What are the signs or symptoms? People may have different symptoms from COVID-19. The symptoms can also be mild to severe. They often show up in 5-6 days after being infected. But they can take up to 14 days to appear. Common symptoms are: Cough. Feeling tired. New loss of taste or smell. Fever. Less common symptoms are: Sore throat. Headache. Body or muscle aches. Diarrhea. A skin rash or odd-colored fingers or toes. Red or irritated eyes. Sometimes, COVID-19 does not cause symptoms. How is this diagnosed? COVID-19 can be diagnosed with tests done in the lab or at home. Fluid from your nose, mouth, or lungs will be used to check for the virus. How is this treated? Treatment for COVID-19 depends on how sick you are. Mild symptoms can be treated at home with rest, fluids, and over-the-counter medicines. Severe symptoms may be treated in a hospital  intensive care unit (ICU). If you have symptoms and are at risk of getting very sick, you may be given a medicine that fights viruses. This medicine is called an antiviral. How is this prevented? To protect yourself from COVID-19: Know your risk factors. Get vaccinated. If your body cannot fight infections easily, talk to your provider about treatment to help prevent COVID-19. Stay at least 1 meter away from others. Wear a well-fitted mask when: You can't stay at a distance from people. You're in a place with poor air flow. Try to be in open spaces with good air flow when in public. Wash your hands often or use an alcohol-based hand sanitizer. Cover your nose and mouth when coughing and sneezing. If you think you have COVID-19 or have been around someone who has it, stay home and be by yourself for 5-10 days. Where to find more information Centers for Disease Control and Prevention (CDC): TonerPromos.no World Health Organization [facility]): VisitDestination.com.br Get help right away if: You have trouble breathing or get short of breath. You have pain or pressure in your chest. You cannot speak or move any part of your body. You are confused. Your symptoms get worse. These symptoms may be an emergency. Get help right away. Call 911. Do not wait to see if the symptoms will go away. Do not drive yourself to the hospital. This information is not intended to replace advice given to you by your health care provider. Make sure you discuss any questions you have with your health care provider. Document Revised: 03/23/2022 Document Reviewed: 12/01/2021 Elsevier Patient  Education  2024 [employer].

## 2023-04-30 NOTE — Assessment & Plan Note (Signed)
Pt advised she may come to [facility] next week for B12 injection if fatigue persists. She is encouraged to follow clean eating plan and to avoid sugar during recovery to help decrease inflammation associated with COVID.

## 2023-04-30 NOTE — Progress Notes (Signed)
Virtual Visit via Video Note  I,Victoria T Hamilton, CMA,acting as a Neurosurgeon for Gwynneth Aliment, MD.,have documented all relevant documentation on the behalf of Gwynneth Aliment, MD,as directed by  Gwynneth Aliment, MD while in the presence of Gwynneth Aliment, MD.  I connected with Sara Obrien on 04/30/23 at 12:40 PM EST by a video enabled telemedicine application and verified that I am speaking with the correct person using two identifiers.  Patient Location: Home Provider Location: Office/Clinic  I discussed the limitations, risks, security, and privacy concerns of performing an evaluation and management service by video and the availability of in person appointments. I also discussed with the patient that there may be a patient responsible charge related to this service. The patient expressed understanding and agreed to proceed.  Subjective: PCP: Sara Peng, MD  Chief Complaint  Patient presents with   Covid Positive   Patient presents today for virtual visit for COVID. She states she initially started feeling bad last Wednesday.  She had sore throat, fatigue and headache. 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 She admits getting through the workday was quite difficult. Her sx worsened over the weekend, so she did a COVID test Sunday night, which was positive.  This is when she developed cough, headache, fever/chills, and sinus congestion. She has been feeling quite miserable. She states her husband is also positive for COVID.      ROS: Per HPI  Current Outpatient Medications:    aspirin EC 81 MG tablet, Take 81 mg by mouth daily. Swallow whole., Disp: , Rfl:    atorvastatin (LIPITOR) 20 MG tablet, TAKE 1 TABLET EVERY DAY, Disp: 90 tablet, Rfl: 3   buPROPion ER (WELLBUTRIN SR) 100 MG 12 hr tablet, 1 po BID, Disp: 60 tablet, Rfl: 0   cetirizine (ZYRTEC) 10 MG chewable tablet, Chew 10 mg by mouth daily. (Patient not taking: Reported on 04/10/2023), Disp: , Rfl:     diphenhydramine-acetaminophen (TYLENOL PM) 25-500 MG TABS tablet, Take 1 tablet by mouth at bedtime as needed., Disp: , Rfl:    escitalopram (LEXAPRO) 20 MG tablet, Take 0.5 tablets (10 mg total) by mouth daily., Disp: , Rfl:    loratadine (CLARITIN) 10 MG tablet, Take 10 mg by mouth daily. (Patient not taking: Reported on 04/10/2023), Disp: , Rfl:    olmesartan-hydrochlorothiazide (BENICAR HCT) 20-12.5 MG tablet, TAKE 1 TABLET BY MOUTH DAILY, Disp: 30 tablet, Rfl: 1   Omega-3 Fatty Acids (FISH OIL) 1200 MG CAPS, Take 3 tabs daily, Disp: , Rfl:    Vitamin D, Ergocalciferol, (DRISDOL) 1.25 MG (50000 UNIT) CAPS capsule, Take 1 capsule (50,000 Units total) by mouth every 7 (seven) days., Disp: 8 capsule, Rfl: 0  Observations/Objective: There were no vitals filed for this visit. Physical Exam Vitals and nursing note reviewed.  Constitutional:      Appearance: She is ill-appearing.  HENT:     Head: Normocephalic and atraumatic.  Eyes:     Extraocular Movements: Extraocular movements intact.  Pulmonary:     Effort: Pulmonary effort is normal.  Musculoskeletal:     Cervical back: Normal range of motion.  Neurological:     General: No focal deficit present.     Mental Status: She is alert.  Psychiatric:        Mood and Affect: Mood normal.        Behavior: Behavior normal.     Assessment and Plan: COVID Assessment & Plan: Advised patient to take  Vitamin C, D, Zinc.  Keep yourself hydrated with a lot of water and rest. Take Delsym for cough and Mucinex as needed. Take Tylenol or pain reliever every 4-6 hours as needed for pain/fever/body ache. If you have elevated blood pressure, you can take OTC Coricidin. You can also take OTC Oscillococcinum, a homeopathic remedy,  to help with your symptoms.  Educated patient that if symptoms get worse or if he/she experiences any SOB, chest pain or pain in their legs to seek immediate emergency care. Continue to monitor your oxygen levels. Call office  ASAP if you have any questions. Quarantine for 5 days if tested positive and no symptoms or 10 days if tested positive and you are with symptoms. Wear a mask around other people.    Orders: -     MyChart Temperature FLOWSHEET; Future  Other post infection and related fatigue syndromes Assessment & Plan: Pt advised she may come to [facility] next week for B12 injection if fatigue persists. She is encouraged to follow clean eating plan and to avoid sugar during recovery to help decrease inflammation associated with COVID.    Other orders -     MYCHART COVID-19 HOME MONITORING PROGRAM; Future  TIME IN VISIT: 11 minutes  Follow Up Instructions: Return if symptoms worsen or fail to improve.   I discussed the assessment and treatment plan with the patient. The patient was provided an opportunity to ask questions, and all were answered. The patient agreed with the plan and demonstrated an understanding of the instructions.   The patient was advised to call back or seek an in-person evaluation if the symptoms worsen or if the condition fails to improve as anticipated.  The above assessment and management plan was discussed with the patient. The patient verbalized understanding of and has agreed to the management plan.   I, Gwynneth Aliment, MD, have reviewed all documentation for this visit. The documentation on 04/30/23 for the exam, diagnosis, procedures, and orders are all accurate and complete.

## 2023-04-30 NOTE — Assessment & Plan Note (Signed)
Advised patient to take Vitamin C, D, Zinc.  Keep yourself hydrated with a lot of water and rest. Take Delsym for cough and Mucinex as needed. Take Tylenol or pain reliever every 4-6 hours as needed for pain/fever/body ache. If you have elevated blood pressure, you can take OTC Coricidin. You can also take OTC Oscillococcinum, a homeopathic remedy,  to help with your symptoms.  Educated patient that if symptoms get worse or if he/she experiences any SOB, chest pain or pain in their legs to seek immediate emergency care. Continue to monitor your oxygen levels. Call office ASAP if you have any questions. Quarantine for 5 days if tested positive and no symptoms or 10 days if tested positive and you are with symptoms. Wear a mask around other people.

## 2023-05-03 ENCOUNTER — Encounter: Payer: Self-pay | Admitting: Internal Medicine

## 2023-05-04 ENCOUNTER — Encounter: Payer: Self-pay | Admitting: Internal Medicine

## 2023-05-04 ENCOUNTER — Encounter (INDEPENDENT_AMBULATORY_CARE_PROVIDER_SITE_OTHER): Payer: Self-pay

## 2023-05-04 ENCOUNTER — Other Ambulatory Visit (INDEPENDENT_AMBULATORY_CARE_PROVIDER_SITE_OTHER): Payer: Self-pay

## 2023-05-05 ENCOUNTER — Encounter (INDEPENDENT_AMBULATORY_CARE_PROVIDER_SITE_OTHER): Payer: Self-pay

## 2023-05-06 DIAGNOSIS — M5416 Radiculopathy, lumbar region: Secondary | ICD-10-CM | POA: Diagnosis not present

## 2023-05-06 DIAGNOSIS — M5451 Vertebrogenic low back pain: Secondary | ICD-10-CM | POA: Diagnosis not present

## 2023-05-07 ENCOUNTER — Encounter: Payer: Self-pay | Admitting: Internal Medicine

## 2023-05-07 ENCOUNTER — Encounter (INDEPENDENT_AMBULATORY_CARE_PROVIDER_SITE_OTHER): Payer: Self-pay

## 2023-05-07 DIAGNOSIS — M545 Low back pain, unspecified: Secondary | ICD-10-CM | POA: Diagnosis not present

## 2023-05-08 ENCOUNTER — Telehealth: Payer: Self-pay

## 2023-05-08 ENCOUNTER — Encounter (INDEPENDENT_AMBULATORY_CARE_PROVIDER_SITE_OTHER): Payer: Self-pay

## 2023-05-08 DIAGNOSIS — M5416 Radiculopathy, lumbar region: Secondary | ICD-10-CM | POA: Diagnosis not present

## 2023-05-08 DIAGNOSIS — M5451 Vertebrogenic low back pain: Secondary | ICD-10-CM | POA: Diagnosis not present

## 2023-05-08 NOTE — Telephone Encounter (Signed)
 Contacted patient through mychart, patient states that she is feeling better. Advised patient to contact primary care provider if symptoms start again.

## 2023-05-09 ENCOUNTER — Encounter: Payer: Self-pay | Admitting: Rheumatology

## 2023-05-09 ENCOUNTER — Ambulatory Visit: Payer: Medicare HMO | Attending: Rheumatology | Admitting: Rheumatology

## 2023-05-09 VITALS — BP 112/72 | HR 69 | Resp 13 | Ht 60.0 in | Wt 156.0 lb

## 2023-05-09 DIAGNOSIS — L659 Nonscarring hair loss, unspecified: Secondary | ICD-10-CM

## 2023-05-09 DIAGNOSIS — M79642 Pain in left hand: Secondary | ICD-10-CM

## 2023-05-09 DIAGNOSIS — E782 Mixed hyperlipidemia: Secondary | ICD-10-CM | POA: Diagnosis not present

## 2023-05-09 DIAGNOSIS — M5442 Lumbago with sciatica, left side: Secondary | ICD-10-CM

## 2023-05-09 DIAGNOSIS — M25552 Pain in left hip: Secondary | ICD-10-CM | POA: Diagnosis not present

## 2023-05-09 DIAGNOSIS — N182 Chronic kidney disease, stage 2 (mild): Secondary | ICD-10-CM

## 2023-05-09 DIAGNOSIS — E559 Vitamin D deficiency, unspecified: Secondary | ICD-10-CM

## 2023-05-09 DIAGNOSIS — M8589 Other specified disorders of bone density and structure, multiple sites: Secondary | ICD-10-CM

## 2023-05-09 DIAGNOSIS — G8929 Other chronic pain: Secondary | ICD-10-CM

## 2023-05-09 DIAGNOSIS — M25562 Pain in left knee: Secondary | ICD-10-CM

## 2023-05-09 DIAGNOSIS — I10 Essential (primary) hypertension: Secondary | ICD-10-CM | POA: Diagnosis not present

## 2023-05-09 DIAGNOSIS — M791 Myalgia, unspecified site: Secondary | ICD-10-CM | POA: Diagnosis not present

## 2023-05-09 DIAGNOSIS — I129 Hypertensive chronic kidney disease with stage 1 through stage 4 chronic kidney disease, or unspecified chronic kidney disease: Secondary | ICD-10-CM

## 2023-05-09 DIAGNOSIS — M79641 Pain in right hand: Secondary | ICD-10-CM

## 2023-05-09 DIAGNOSIS — R3129 Other microscopic hematuria: Secondary | ICD-10-CM

## 2023-05-09 DIAGNOSIS — F39 Unspecified mood [affective] disorder: Secondary | ICD-10-CM

## 2023-05-09 DIAGNOSIS — R7303 Prediabetes: Secondary | ICD-10-CM

## 2023-05-09 DIAGNOSIS — R768 Other specified abnormal immunological findings in serum: Secondary | ICD-10-CM

## 2023-05-09 DIAGNOSIS — R7689 Other specified abnormal immunological findings in serum: Secondary | ICD-10-CM

## 2023-05-09 DIAGNOSIS — R0602 Shortness of breath: Secondary | ICD-10-CM

## 2023-05-09 DIAGNOSIS — G479 Sleep disorder, unspecified: Secondary | ICD-10-CM

## 2023-05-09 NOTE — Patient Instructions (Signed)
 Osteoarthritis  Osteoarthritis is a type of arthritis. It refers to joint pain or joint disease. Osteoarthritis affects tissue that covers the ends of bones in joints (cartilage). Cartilage acts as a cushion between the bones and helps them move smoothly. Osteoarthritis occurs when cartilage in the joints gets worn down. Osteoarthritis is sometimes called "wear and tear" arthritis. Osteoarthritis is the most common form of arthritis. It often occurs in older people. It is a condition that gets worse over time. The joints most often affected by this condition are in the fingers, toes, hips, knees, and spine, including the neck and lower back. What are the causes? This condition is caused by the wearing down of cartilage that covers the ends of bones. What increases the risk? The following factors may make you more likely to develop this condition: Being age 78 or older. Obesity. Overuse of joints. Past injury of a joint. Past surgery on a joint. Family history of osteoarthritis. What are the signs or symptoms? The main symptoms of this condition are pain, swelling, and stiffness in the joint. Other symptoms may include: An enlarged joint. More pain and further damage caused by small pieces of bone or cartilage that break off and float inside of the joint. Small deposits of bone (osteophytes) that grow on the edges of the joint. A grating or scraping feeling inside the joint when you move it. Popping or creaking sounds when you move. Difficulty walking or exercising. An inability to grip items, twist your hand, or control the movements of your hands and fingers. How is this diagnosed? This condition may be diagnosed based on: Your medical history. A physical exam. Your symptoms. X-rays of the affected joints. Blood tests to rule out other types of arthritis. How is this treated? There is no cure for this condition, but treatment can help control pain and improve joint function. Treatment  may include a combination of therapies, such as: Pain relief techniques, such as: Applying heat and cold to the joint. Massage. A form of talk therapy called cognitive behavioral therapy (CBT). This therapy helps you set goals and follow up on the changes that you make. Medicines for pain and inflammation. The medicines can be taken by mouth or applied to the skin. They include: NSAIDs, such as ibuprofen. Prescription medicines. Strong anti-inflammatory medicines (corticosteroids). Certain nutritional supplements. A prescribed exercise program. You may work with a physical therapist. Assistive devices, such as a brace, wrap, splint, specialized glove, or cane. A weight control plan. Surgery, such as: An osteotomy. This is done to reposition the bones and relieve pain or to remove loose pieces of bone and cartilage. Joint replacement surgery. You may need this surgery if you have advanced osteoarthritis. Follow these instructions at home: Activity Rest your affected joints as told by your health care provider. Exercise as told by your provider. The provider may recommend specific types of exercise, such as: Strengthening exercises. These are done to strengthen the muscles that support joints affected by arthritis. Aerobic activities. These are exercises, such as brisk walking or water aerobics, that increase your heart rate. Range-of-motion activities. These help your joints move more easily. Balance and agility exercises. Managing pain, stiffness, and swelling     If told, apply heat to the affected area as often as told by your provider. Use the heat source that your provider recommends, such as a moist heat pack or a heating pad. If you have a removable assistive device, remove it as told by your provider. Place a  towel between your skin and the heat source. If your provider tells you to keep the assistive device on while you apply heat, place a towel between the assistive device and  the heat source. Leave the heat on for 20-30 minutes. If told, put ice on the affected area. If you have a removable assistive device, remove it as told by your provider. Put ice in a plastic bag. Place a towel between your skin and the bag. If your provider tells you to keep the assistive device on during icing, place a towel between the assistive device and the bag. Leave the ice on for 20 minutes, 2-3 times a day. If your skin turns bright red, remove the ice or heat right away to prevent skin damage. The risk of damage is higher if you cannot feel pain, heat, or cold. Move your fingers or toes often to reduce stiffness and swelling. Raise (elevate) the affected area above the level of your heart while you are sitting or lying down. General instructions Take over-the-counter and prescription medicines only as told by your provider. Maintain a healthy weight. Follow instructions from your provider for weight control. Do not use any products that contain nicotine or tobacco. These products include cigarettes, chewing tobacco, and vaping devices, such as e-cigarettes. If you need help quitting, ask your provider. Use assistive devices as told by your provider. Where to find more information [employer]th[employer] and Musculoskeletal [employer] Skin Diseases: niams.http://www.myers.net/ [employer] on Aging: BaseRingTones.pl [employer] of Rheumatology: rheumatology.org Contact a health care provider if: You have redness, swelling, or a feeling of warmth in a joint that gets worse. You have a fever along with joint or muscle aches. You develop a rash. You have trouble doing your normal activities. You have pain that gets worse and is not relieved by pain medicine. This information is not intended to replace advice given to you by your health care provider. Make sure you discuss any questions you have with your health care provider. Document Revised: 11/16/2021 Document Reviewed:  11/16/2021 [employer] Patient Education  2024 [employer]  Hand Exercises Hand exercises can be helpful for almost anyone. They can strengthen your hands and improve flexibility and movement. The exercises can also increase blood flow to the hands. These results can make your work and daily tasks easier for you. Hand exercises can be especially helpful for people who have joint pain from arthritis or nerve damage from using their hands over and over. These exercises can also help people who injure a hand. Exercises Most of these hand exercises are gentle stretching and motion exercises. It is usually safe to do them often throughout the day. Warming up your hands before exercise may help reduce stiffness. You can do this with gentle massage or by placing your hands in warm water for 10-15 minutes. It is normal to feel some stretching, pulling, tightness, or mild discomfort when you begin new exercises. In time, this will improve. Remember to always be careful and stop right away if you feel sudden, very bad pain or your pain gets worse. You want to get better and be safe. Ask your health care provider which exercises are safe for you. Do exercises exactly as told by your provider and adjust them as told. Do not begin these exercises until told by your provider. Knuckle bend or "claw" fist  Stand or sit with your arm, hand, and all five fingers pointed straight up. Make sure to keep your wrist straight. Gently  bend your fingers down toward your palm until the tips of your fingers are touching your palm. Keep your big knuckle straight and only bend the small knuckles in your fingers. Hold this position for 10 seconds. Straighten your fingers back to your starting position. Repeat this exercise 5-10 times with each hand. Full finger fist  Stand or sit with your arm, hand, and all five fingers pointed straight up. Make sure to keep your wrist straight. Gently bend your fingers into your palm until  the tips of your fingers are touching the middle of your palm. Hold this position for 10 seconds. Extend your fingers back to your starting position, stretching every joint fully. Repeat this exercise 5-10 times with each hand. Straight fist  Stand or sit with your arm, hand, and all five fingers pointed straight up. Make sure to keep your wrist straight. Gently bend your fingers at the big knuckle, where your fingers meet your hand, and at the middle knuckle. Keep the knuckle at the tips of your fingers straight and try to touch the bottom of your palm. Hold this position for 10 seconds. Extend your fingers back to your starting position, stretching every joint fully. Repeat this exercise 5-10 times with each hand. Tabletop  Stand or sit with your arm, hand, and all five fingers pointed straight up. Make sure to keep your wrist straight. Gently bend your fingers at the big knuckle, where your fingers meet your hand, as far down as you can. Keep the small knuckles in your fingers straight. Think of forming a tabletop with your fingers. Hold this position for 10 seconds. Extend your fingers back to your starting position, stretching every joint fully. Repeat this exercise 5-10 times with each hand. Finger spread  Place your hand flat on a table with your palm facing down. Make sure your wrist stays straight. Spread your fingers and thumb apart from each other as far as you can until you feel a gentle stretch. Hold this position for 10 seconds. Bring your fingers and thumb tight together again. Hold this position for 10 seconds. Repeat this exercise 5-10 times with each hand. Making circles  Stand or sit with your arm, hand, and all five fingers pointed straight up. Make sure to keep your wrist straight. Make a circle by touching the tip of your thumb to the tip of your index finger. Hold for 10 seconds. Then open your hand wide. Repeat this motion with your thumb and each of your  fingers. Repeat this exercise 5-10 times with each hand. Thumb motion  Sit with your forearm resting on a table and your wrist straight. Your thumb should be facing up toward the ceiling. Keep your fingers relaxed as you move your thumb. Lift your thumb up as high as you can toward the ceiling. Hold for 10 seconds. Bend your thumb across your palm as far as you can, reaching the tip of your thumb for the small finger (pinkie) side of your palm. Hold for 10 seconds. Repeat this exercise 5-10 times with each hand. Grip strengthening  Hold a stress ball or other soft ball in the middle of your hand. Slowly increase the pressure, squeezing the ball as much as you can without causing pain. Think of bringing the tips of your fingers into the middle of your palm. All of your finger joints should bend when doing this exercise. Hold your squeeze for 10 seconds, then relax. Repeat this exercise 5-10 times with each hand. Contact a health  care provider if: Your hand pain or discomfort gets much worse when you do an exercise. Your hand pain or discomfort does not improve within 2 hours after you exercise. If you have either of these problems, stop doing these exercises right away. Do not do them again unless your provider says that you can. Get help right away if: You develop sudden, severe hand pain or swelling. If this happens, stop doing these exercises right away. Do not do them again unless your provider says that you can. This information is not intended to replace advice given to you by your health care provider. Make sure you discuss any questions you have with your health care provider. Document Revised: 04/03/2022 Document Reviewed: 04/03/2022 Else[employer] Patient Education  2024 [employer] Exercises for Chronic Knee Pain Chronic knee pain is pain that lasts longer than 3 months. For most people with chronic knee pain, exercise and weight loss is an important part of treatment. Your health care  provider may want you to focus on: Making the muscles that support your knee stronger. This can take pressure off your knee and reduce pain. Preventing knee stiffness. How far you can move your knee, keeping it there or making it farther. Losing weight (if this applies) to take pressure off your knee, lower your risk for injury, and make it easier for you to exercise. Your provider will help you make an exercise program that fits your needs and physical abilities. Below are simple, low-impact exercises you can do at home. Ask your provider or physical therapist how often you should do your exercise program and how many times to repeat each exercise. General safety tips  Get your provider's approval before doing any exercises. Start slowly and stop any time you feel pain. Do not exercise if your knee pain is flaring up. Warm up first. Stretching a cold muscle can cause an injury. Do 5-10 minutes of easy movement or light stretching before beginning your exercises. Do 5-10 minutes of low-impact activity (like walking or cycling) before starting strengthening exercises. Contact your provider any time you have pain during or after exercising. Exercise can cause discomfort but should not be painful. It is normal to be a little stiff or sore after exercising. Stretching and range-of-motion exercises Front thigh stretch  Stand up straight and support your body by holding on to a chair or resting one hand on a wall. With your legs straight and close together, bend one knee to lift your heel up toward your butt. Using one hand for support, grab your ankle with your free hand. Pull your foot up closer toward your butt to feel the stretch in front of your thigh. Hold the stretch for 30 seconds. Repeat __________ times. Complete this exercise __________ times a day. Back thigh stretch  Sit on the floor with your back straight and your legs out straight in front of you. Place the palms of your hands on  the floor and slide them toward your feet as you bend at the hip. Try to touch your nose to your knees and feel the stretch in the back of your thighs. Hold for 30 seconds. Repeat __________ times. Complete this exercise __________ times a day. Calf stretch  Stand facing a wall. Place the palms of your hands flat against the wall, arms extended, and lean slightly against the wall. Get into a lunge position with one leg bent at the knee and the other leg stretched out straight behind you. Keep both feet facing  the wall and increase the bend in your knee while keeping the heel of the other leg flat on the ground. You should feel the stretch in your calf. Hold for 30 seconds. Repeat __________ times. Complete this exercise __________ times a day. Strengthening exercises Straight leg lift  Lie on your back with one knee bent and the other leg out straight. Slowly lift the straight leg without bending the knee. Lift until your foot is about 12 inches (30 cm) off the floor. Hold for 3-5 seconds and slowly lower your leg. Repeat __________ times. Complete this exercise __________ times a day. Single leg dip  Stand between two chairs and put both hands on the backs of the chairs for support. Extend one leg out straight with your body weight resting on the heel of the standing leg. Slowly bend your standing knee to dip your body to the level that is comfortable for you. Hold for 3-5 seconds. Repeat __________ times. Complete this exercise __________ times a day. Hamstring curls  Stand straight, knees close together, facing the back of a chair. Hold on to the back of a chair with both hands. Keep one leg straight. Bend the other knee while bringing the heel up toward the butt until the knee is bent at a 90-degree angle (right angle). Hold for 3-5 seconds. Repeat __________ times. Complete this exercise __________ times a day. Wall squat  Stand straight with your back, hips, and head against  a wall. Step forward one foot at a time with your back still against the wall. Your feet should be 2 feet (61 cm) from the wall at shoulder width. Keeping your back, hips, and head against the wall, slide down the wall to as close to a sitting position as you can get. Hold for 5-10 seconds, then slowly slide back up. Repeat __________ times. Complete this exercise __________ times a day. Step-ups  Stand in front of a sturdy platform or stool that is about 6 inches (15 cm) high. Slowly step up with your left / right foot, keeping your knee in line with your hip and foot. Do not let your knee bend so far that you cannot see your toes. Hold on to a chair for balance, but do not use it for support. Slowly unlock your knee and lower yourself to the starting position. Repeat __________ times. Complete this exercise __________ times a day. Contact a health care provider if: Your exercises cause pain. Your pain is worse after you exercise. Your pain prevents you from doing your exercises. This information is not intended to replace advice given to you by your health care provider. Make sure you discuss any questions you have with your health care provider. Document Revised: 04/03/2022 Document Reviewed: 04/03/2022 Else[employer] Patient Education  2024 [employer]  Low Back Sprain or Strain Rehab Ask your health care provider which exercises are safe for you. Do exercises exactly as told by your health care provider and adjust them as directed. It is normal to feel mild stretching, pulling, tightness, or discomfort as you do these exercises. Stop right away if you feel sudden pain or your pain gets worse. Do not begin these exercises until told by your health care provider. Stretching and range-of-motion exercises These exercises warm up your muscles and joints and improve the movement and flexibility of your back. These exercises also help to relieve pain, numbness, and tingling. Lumbar rotation  Lie  on your back on a firm bed or the floor with your  knees bent. Straighten your arms out to your sides so each arm forms a 90-degree angle (right angle) with a side of your body. Slowly move (rotate) both of your knees to one side of your body until you feel a stretch in your lower back (lumbar). Try not to let your shoulders lift off the floor. Hold this position for __________ seconds. Tense your abdominal muscles and slowly move your knees back to the starting position. Repeat this exercise on the other side of your body. Repeat __________ times. Complete this exercise __________ times a day. Single knee to chest  Lie on your back on a firm bed or the floor with both legs straight. Bend one of your knees. Use your hands to move your knee up toward your chest until you feel a gentle stretch in your lower back and buttock. Hold your leg in this position by holding on to the front of your knee. Keep your other leg as straight as possible. Hold this position for __________ seconds. Slowly return to the starting position. Repeat with your other leg. Repeat __________ times. Complete this exercise __________ times a day. Prone extension on elbows  Lie on your abdomen on a firm bed or the floor (prone position). Prop yourself up on your elbows. Use your arms to help lift your chest up until you feel a gentle stretch in your abdomen and your lower back. This will place some of your body weight on your elbows. If this is uncomfortable, try stacking pillows under your chest. Your hips should stay down, against the surface that you are lying on. Keep your hip and back muscles relaxed. Hold this position for __________ seconds. Slowly relax your upper body and return to the starting position. Repeat __________ times. Complete this exercise __________ times a day. Strengthening exercises These exercises build strength and endurance in your back. Endurance is the ability to use your muscles for a long  time, even after they get tired. Pelvic tilt This exercise strengthens the muscles that lie deep in the abdomen. Lie on your back on a firm bed or the floor with your legs extended. Bend your knees so they are pointing toward the ceiling and your feet are flat on the floor. Tighten your lower abdominal muscles to press your lower back against the floor. This motion will tilt your pelvis so your tailbone points up toward the ceiling instead of pointing to your feet or the floor. To help with this exercise, you may place a small towel under your lower back and try to push your back into the towel. Hold this position for __________ seconds. Let your muscles relax completely before you repeat this exercise. Repeat __________ times. Complete this exercise __________ times a day. Alternating arm and leg raises  Get on your hands and knees on a firm surface. If you are on a hard floor, you may want to use padding, such as an exercise mat, to cushion your knees. Line up your arms and legs. Your hands should be directly below your shoulders, and your knees should be directly below your hips. Lift your left leg behind you. At the same time, raise your right arm and straighten it in front of you. Do not lift your leg higher than your hip. Do not lift your arm higher than your shoulder. Keep your abdominal and back muscles tight. Keep your hips facing the ground. Do not arch your back. Keep your balance carefully, and do not hold your breath. Hold this  position for __________ seconds. Slowly return to the starting position. Repeat with your right leg and your left arm. Repeat __________ times. Complete this exercise __________ times a day. Abdominal set with straight leg raise  Lie on your back on a firm bed or the floor. Bend one of your knees and keep your other leg straight. Tense your abdominal muscles and lift your straight leg up, 4-6 inches (10-15 cm) off the ground. Keep your abdominal  muscles tight and hold this position for __________ seconds. Do not hold your breath. Do not arch your back. Keep it flat against the ground. Keep your abdominal muscles tense as you slowly lower your leg back to the starting position. Repeat with your other leg. Repeat __________ times. Complete this exercise __________ times a day. Single leg lower with bent knees Lie on your back on a firm bed or the floor. Tense your abdominal muscles and lift your feet off the floor, one foot at a time, so your knees and hips are bent in 90-degree angles (right angles). Your knees should be over your hips and your lower legs should be parallel to the floor. Keeping your abdominal muscles tense and your knee bent, slowly lower one of your legs so your toe touches the ground. Lift your leg back up to return to the starting position. Do not hold your breath. Do not let your back arch. Keep your back flat against the ground. Repeat with your other leg. Repeat __________ times. Complete this exercise __________ times a day. Posture and body mechanics Good posture and healthy body mechanics can help to relieve stress in your body's tissues and joints. Body mechanics refers to the movements and positions of your body while you do your daily activities. Posture is part of body mechanics. Good posture means: Your spine is in its natural S-curve position (neutral). Your shoulders are pulled back slightly. Your head is not tipped forward (neutral). Follow these guidelines to improve your posture and body mechanics in your everyday activities. Standing  When standing, keep your spine neutral and your feet about hip-width apart. Keep a slight bend in your knees. Your ears, shoulders, and hips should line up. When you do a task in which you stand in one place for a long time, place one foot up on a stable object that is 2-4 inches (5-10 cm) high, such as a footstool. This helps keep your spine  neutral. Sitting  When sitting, keep your spine neutral and keep your feet flat on the floor. Use a footrest, if necessary, and keep your thighs parallel to the floor. Avoid rounding your shoulders, and avoid tilting your head forward. When working at a desk or a computer, keep your desk at a height where your hands are slightly lower than your elbows. Slide your chair under your desk so you are close enough to maintain good posture. When working at a computer, place your monitor at a height where you are looking straight ahead and you do not have to tilt your head forward or downward to look at the screen. Resting When lying down and resting, avoid positions that are most painful for you. If you have pain with activities such as sitting, bending, stooping, or squatting, lie in a position in which your body does not bend very much. For example, avoid curling up on your side with your arms and knees near your chest (fetal position). If you have pain with activities such as standing for a long time or reaching  with your arms, lie with your spine in a neutral position and bend your knees slightly. Try the following positions: Lying on your side with a pillow between your knees. Lying on your back with a pillow under your knees. Lifting  When lifting objects, keep your feet at least shoulder-width apart and tighten your abdominal muscles. Bend your knees and hips and keep your spine neutral. It is important to lift using the strength of your legs, not your back. Do not lock your knees straight out. Always ask for help to lift heavy or awkward objects. This information is not intended to replace advice given to you by your health care provider. Make sure you discuss any questions you have with your health care provider. Document Revised: 07/23/2022 Document Reviewed: 06/06/2020 Elsevier Patient Education  2024 [employer].

## 2023-05-11 ENCOUNTER — Encounter (INDEPENDENT_AMBULATORY_CARE_PROVIDER_SITE_OTHER): Payer: Self-pay

## 2023-05-13 DIAGNOSIS — M5451 Vertebrogenic low back pain: Secondary | ICD-10-CM | POA: Diagnosis not present

## 2023-05-13 DIAGNOSIS — M5416 Radiculopathy, lumbar region: Secondary | ICD-10-CM | POA: Diagnosis not present

## 2023-05-15 DIAGNOSIS — M5416 Radiculopathy, lumbar region: Secondary | ICD-10-CM | POA: Diagnosis not present

## 2023-05-15 DIAGNOSIS — M5451 Vertebrogenic low back pain: Secondary | ICD-10-CM | POA: Diagnosis not present

## 2023-05-20 DIAGNOSIS — M5416 Radiculopathy, lumbar region: Secondary | ICD-10-CM | POA: Diagnosis not present

## 2023-05-20 DIAGNOSIS — M5451 Vertebrogenic low back pain: Secondary | ICD-10-CM | POA: Diagnosis not present

## 2023-05-22 DIAGNOSIS — M5416 Radiculopathy, lumbar region: Secondary | ICD-10-CM | POA: Diagnosis not present

## 2023-05-22 DIAGNOSIS — M5451 Vertebrogenic low back pain: Secondary | ICD-10-CM | POA: Diagnosis not present

## 2023-05-27 DIAGNOSIS — M5451 Vertebrogenic low back pain: Secondary | ICD-10-CM | POA: Diagnosis not present

## 2023-05-27 DIAGNOSIS — M5416 Radiculopathy, lumbar region: Secondary | ICD-10-CM | POA: Diagnosis not present

## 2023-06-05 DIAGNOSIS — M5416 Radiculopathy, lumbar region: Secondary | ICD-10-CM | POA: Diagnosis not present

## 2023-06-05 DIAGNOSIS — M48062 Spinal stenosis, lumbar region with neurogenic claudication: Secondary | ICD-10-CM | POA: Diagnosis not present

## 2023-06-06 ENCOUNTER — Encounter (INDEPENDENT_AMBULATORY_CARE_PROVIDER_SITE_OTHER): Payer: Self-pay

## 2023-06-12 ENCOUNTER — Other Ambulatory Visit: Payer: Self-pay | Admitting: Internal Medicine

## 2023-06-12 DIAGNOSIS — M5416 Radiculopathy, lumbar region: Secondary | ICD-10-CM | POA: Diagnosis not present

## 2023-06-12 DIAGNOSIS — M5451 Vertebrogenic low back pain: Secondary | ICD-10-CM | POA: Diagnosis not present

## 2023-06-19 DIAGNOSIS — M5416 Radiculopathy, lumbar region: Secondary | ICD-10-CM | POA: Diagnosis not present

## 2023-06-19 DIAGNOSIS — M5451 Vertebrogenic low back pain: Secondary | ICD-10-CM | POA: Diagnosis not present

## 2023-06-24 DIAGNOSIS — M5416 Radiculopathy, lumbar region: Secondary | ICD-10-CM | POA: Diagnosis not present

## 2023-06-24 DIAGNOSIS — M5451 Vertebrogenic low back pain: Secondary | ICD-10-CM | POA: Diagnosis not present

## 2023-06-26 DIAGNOSIS — M5451 Vertebrogenic low back pain: Secondary | ICD-10-CM | POA: Diagnosis not present

## 2023-06-26 DIAGNOSIS — M5416 Radiculopathy, lumbar region: Secondary | ICD-10-CM | POA: Diagnosis not present

## 2023-07-06 ENCOUNTER — Other Ambulatory Visit: Payer: Self-pay | Admitting: Internal Medicine

## 2023-07-15 ENCOUNTER — Other Ambulatory Visit: Payer: Self-pay | Admitting: Internal Medicine

## 2023-07-15 DIAGNOSIS — F39 Unspecified mood [affective] disorder: Secondary | ICD-10-CM

## 2023-07-29 ENCOUNTER — Ambulatory Visit (INDEPENDENT_AMBULATORY_CARE_PROVIDER_SITE_OTHER): Payer: Medicare HMO | Admitting: Internal Medicine

## 2023-07-29 ENCOUNTER — Encounter: Payer: Self-pay | Admitting: Internal Medicine

## 2023-07-29 VITALS — BP 130/84 | HR 59 | Temp 98.0°F | Ht 60.0 in | Wt 161.2 lb

## 2023-07-29 DIAGNOSIS — E6609 Other obesity due to excess calories: Secondary | ICD-10-CM | POA: Diagnosis not present

## 2023-07-29 DIAGNOSIS — E66811 Obesity, class 1: Secondary | ICD-10-CM | POA: Diagnosis not present

## 2023-07-29 DIAGNOSIS — M79641 Pain in right hand: Secondary | ICD-10-CM

## 2023-07-29 DIAGNOSIS — Z6831 Body mass index (BMI) 31.0-31.9, adult: Secondary | ICD-10-CM | POA: Diagnosis not present

## 2023-07-29 DIAGNOSIS — N182 Chronic kidney disease, stage 2 (mild): Secondary | ICD-10-CM

## 2023-07-29 DIAGNOSIS — M79642 Pain in left hand: Secondary | ICD-10-CM

## 2023-07-29 DIAGNOSIS — E7800 Pure hypercholesterolemia, unspecified: Secondary | ICD-10-CM | POA: Diagnosis not present

## 2023-07-29 DIAGNOSIS — I129 Hypertensive chronic kidney disease with stage 1 through stage 4 chronic kidney disease, or unspecified chronic kidney disease: Secondary | ICD-10-CM

## 2023-07-29 DIAGNOSIS — E559 Vitamin D deficiency, unspecified: Secondary | ICD-10-CM | POA: Diagnosis not present

## 2023-07-29 DIAGNOSIS — R7309 Other abnormal glucose: Secondary | ICD-10-CM | POA: Diagnosis not present

## 2023-07-29 MED ORDER — LEVOCETIRIZINE DIHYDROCHLORIDE 5 MG PO TABS: 5.0000 mg | ORAL_TABLET | Freq: Every evening | ORAL | 1 refills | Status: AC

## 2023-07-29 NOTE — Assessment & Plan Note (Signed)
 Previous labs reviewed, her A1c has been elevated in the past. I will check an A1c today. Reminded to avoid refined sugars including sugary drinks/foods and processed meats including bacon, sausages and deli meats.

## 2023-07-29 NOTE — Progress Notes (Signed)
 I,Victoria T Basil Lim, CMA,acting as a Neurosurgeon for Smiley Dung, MD.,have documented all relevant documentation on the behalf of Smiley Dung, MD,as directed by  Smiley Dung, MD while in the presence of Smiley Dung, MD.  Subjective:  Patient ID: Sara Obrien , female    DOB: October 28, 1945 , 78 y.o.   MRN: [id number]  Chief Complaint  Patient presents with   Hypertension    Patient presents today for bp & cholesterol follow up. She reports compliance with medications. Denies headache, chest pain & sob.   Hyperlipidemia    HPI Discussed the use of AI scribe software for clinical note transcription with the patient, who gave verbal consent to proceed.  History of Present Illness Sara BRITTIAN is a 78 year old female with hypertension and hyperlipidemia who presents for a blood pressure check and cholesterol management.  She is currently taking aspirin 81 mg daily, Lipitor 20 mg daily, and olmesartan  20/12.5 mg daily. She uses Xyzal  over the counter for allergies and takes Lexapro  20 mg daily along with an over-the-counter vitamin D  supplement, though she is unsure of the dose.  She experiences aching in her fingers, which she attributes to arthritis, without associated morning stiffness. A previous specialist suggested osteoarthritis rather than an autoimmune condition.  She has aching down the side of her leg and finds physical therapy beneficial. She exercises regularly, playing pickleball and using a stationary bike at the [facility], which provides better back support than her home equipment.  She mentions a recent weight gain, attributing it to eating at the hospital cafeteria, including consuming hot dogs, which she acknowledges may affect her blood pressure. She reports a recent headache and notes that her blood pressure is slightly elevated today.  No morning stiffness in her hands. Regular exercise is confirmed, and she reports a recent headache.   Hypertension This is a  chronic problem. The current episode started more than 1 year ago. The problem has been gradually improving since onset. The problem is controlled. Pertinent negatives include no blurred vision. Past treatments include angiotensin blockers and diuretics. The current treatment provides moderate improvement. There are no compliance problems.  Hypertensive end-organ damage includes kidney disease.     Past Medical History:  Diagnosis Date   Anxiety    Atypical chest pain 07/01/2017   Constipation    Depression    Essential hypertension 07/01/2017   Gum disease    dentures worn   History of swelling of feet    Hyperlipidemia    Hypertension    Knee pain    Low back pain    Migraines    Overweight      Family History  Problem Relation Age of Onset   Brain cancer Mother    Hypertension Mother    Diabetes Mother    High Cholesterol Mother    Heart disease Mother    Stroke Mother    Depression Mother    Stroke Father    Hypertension Father    Heart disease Father    Sudden death Father    Hypertension Sister    Breast cancer Sister    Cancer Brother    Cancer Brother    Cancer Brother      Current Outpatient Medications:    aspirin EC 81 MG tablet, Take 81 mg by mouth daily. Swallow whole., Disp: , Rfl:    atorvastatin  (LIPITOR) 20 MG tablet, TAKE 1 TABLET EVERY DAY, Disp: 90 tablet, Rfl: 3  escitalopram  (LEXAPRO ) 20 MG tablet, TAKE 1 TABLET EVERY DAY, Disp: 90 tablet, Rfl: 3   levocetirizine (XYZAL ) 5 MG tablet, Take 1 tablet (5 mg total) by mouth every evening., Disp: 90 tablet, Rfl: 1   olmesartan -hydrochlorothiazide (BENICAR  HCT) 20-12.5 MG tablet, TAKE 1 TABLET EVERY DAY, Disp: 90 tablet, Rfl: 3   Omega-3 Fatty Acids (FISH OIL ) 1200 MG CAPS, Take 3 tabs daily, Disp: , Rfl:    diphenhydramine-acetaminophen  (TYLENOL  PM) 25-500 MG TABS tablet, Take 1 tablet by mouth at bedtime as needed., Disp: , Rfl:    No Known Allergies   Review of Systems  Constitutional:  Negative.   Eyes:  Negative for blurred vision.  Respiratory: Negative.    Cardiovascular: Negative.   Neurological: Negative.   Psychiatric/Behavioral: Negative.       Today's Vitals   07/29/23 0905 07/29/23 0920  BP: (!) 140/84 130/84  Pulse: (!) 59   Temp: 98 F (36.7 C)   SpO2: 98%   Weight: 161 lb 3.2 oz (73.1 kg)   Height: 5' (1.524 m)    Body mass index is 31.48 kg/m.  Wt Readings from Last 3 Encounters:  07/29/23 161 lb 3.2 oz (73.1 kg)  05/09/23 156 lb (70.8 kg)  04/10/23 161 lb (73 kg)     Objective:  Physical Exam Vitals and nursing note reviewed.  Constitutional:      Appearance: Normal appearance.  HENT:     Head: Normocephalic and atraumatic.  Eyes:     Extraocular Movements: Extraocular movements intact.  Cardiovascular:     Rate and Rhythm: Normal rate and regular rhythm.     Heart sounds: Normal heart sounds.  Pulmonary:     Effort: Pulmonary effort is normal.     Breath sounds: Normal breath sounds.  Musculoskeletal:        General: Swelling present.     Cervical back: Normal range of motion.  Skin:    General: Skin is warm.  Neurological:     General: No focal deficit present.     Mental Status: She is alert.  Psychiatric:        Mood and Affect: Mood normal.        Behavior: Behavior normal.         Assessment And Plan:  Hypertensive nephropathy Assessment & Plan: Chronic, fair control. Goal BP<120/80. She will continue with olmesartan /hct. Blood pressure slightly elevated, likely due to high-sodium diet. - Encouraged dietary sodium reduction.  Orders: -     CBC -     CMP14+EGFR -     Lipid panel  Chronic renal disease, stage II Assessment & Plan: Chronic, she is encouraged to keep BP well controlled and to stay well hydrated to decrease risk of CKD progression.    Orders: -     CMP14+EGFR  Pure hypercholesterolemia Assessment & Plan: Chronic, currently taking atorvastatin  20mg  daily. Encouraged to follow heart healthy  lifestyle.   Lab Results  Component Value Date   CHOL 144 10/24/2022   CHOL 262 (H) 06/12/2022   CHOL 166 01/19/2022   Lab Results  Component Value Date   HDL 47 10/24/2022   HDL 61 06/12/2022   HDL 46 01/19/2022   Lab Results  Component Value Date   LDLCALC 80 10/24/2022   LDLCALC 185 (H) 06/12/2022   LDLCALC 103 (H) 01/19/2022   Lab Results  Component Value Date   TRIG 90 10/24/2022   TRIG 93 06/12/2022   TRIG 94 01/19/2022   Lab Results  Component Value Date   CHOLHDL 3.6 01/19/2022   CHOLHDL 3.9 01/05/2021   CHOLHDL 3.1 12/31/2019   No results found for: "LDLDIRECT"   Orders: -     CBC -     CMP14+EGFR -     Lipid panel  Bilateral hand pain Assessment & Plan: She has h/o positive ANA. She has been evaluated by Rheumatology, no clinical signs of autoimmune disease. X-rays indicated osteoarthritis. She should take Tylenol  prn. Aching in fingers, likely due to osteoarthritis. Previous evaluation suggested osteoarthritis over autoimmune arthritis. - Order vitamin D  level test. - Send picture of vitamin D  supplement bottle for dosage assessment.  Orders: -     VITAMIN D  25 Hydroxy (Vit-D Deficiency, Fractures)  Other abnormal glucose Assessment & Plan: Previous labs reviewed, her A1c has been elevated in the past. I will check an A1c today. Reminded to avoid refined sugars including sugary drinks/foods and processed meats including bacon, sausages and deli meats.    Orders: -     Hemoglobin A1c  Class 1 obesity due to excess calories with serious comorbidity and body mass index (BMI) of 31.0 to 31.9 in adult Assessment & Plan: She is aware of 5lb weight gain.  She is encouraged to strive for BMI less than 30 to decrease cardiac risk. Advised to aim for at least 150 minutes of exercise per week.    Other orders -     Levocetirizine Dihydrochloride ; Take 1 tablet (5 mg total) by mouth every evening.  Dispense: 90 tablet; Refill: 1    Return if symptoms  worsen or fail to improve.  Patient was given opportunity to ask questions. Patient verbalized understanding of the plan and was able to repeat key elements of the plan. All questions were answered to their satisfaction.   I, Smiley Dung, MD, have reviewed all documentation for this visit. The documentation on 07/29/23 for the exam, diagnosis, procedures, and orders are all accurate and complete.   IF YOU HAVE BEEN REFERRED TO A SPECIALIST, IT MAY TAKE 1-2 WEEKS TO SCHEDULE/PROCESS THE REFERRAL. IF YOU HAVE NOT HEARD FROM US /SPECIALIST IN TWO WEEKS, PLEASE GIVE US  A CALL AT [phone] X [phone].   THE PATIENT IS ENCOURAGED TO PRACTICE SOCIAL DISTANCING DUE TO THE COVID-19 PANDEMIC.

## 2023-07-29 NOTE — Assessment & Plan Note (Addendum)
 She has h/o positive ANA. She has been evaluated by Rheumatology, no clinical signs of autoimmune disease. X-rays indicated osteoarthritis. She should take Tylenol  prn. Aching in fingers, likely due to osteoarthritis. Previous evaluation suggested osteoarthritis over autoimmune arthritis. - Order vitamin D  level test. - Send picture of vitamin D  supplement bottle for dosage assessment.

## 2023-07-29 NOTE — Assessment & Plan Note (Signed)
 Chronic, fair control. Goal BP<120/80. She will continue with olmesartan /hct. Blood pressure slightly elevated, likely due to high-sodium diet. - Encouraged dietary sodium reduction.

## 2023-07-29 NOTE — Patient Instructions (Signed)
 Hypertension, Adult Hypertension is another name for high blood pressure. High blood pressure forces your heart to work harder to pump blood. This can cause problems over time. There are two numbers in a blood pressure reading. There is a top number (systolic) over a bottom number (diastolic). It is best to have a blood pressure that is below 120/80. What are the causes? The cause of this condition is not known. Some other conditions can lead to high blood pressure. What increases the risk? Some lifestyle factors can make you more likely to develop high blood pressure: Smoking. Not getting enough exercise or physical activity. Being overweight. Having too much fat, sugar, calories, or salt (sodium) in your diet. Drinking too much alcohol. Other risk factors include: Having any of these conditions: Heart disease. Diabetes. High cholesterol. Kidney disease. Obstructive sleep apnea. Having a family history of high blood pressure and high cholesterol. Age. The risk increases with age. Stress. What are the signs or symptoms? High blood pressure may not cause symptoms. Very high blood pressure (hypertensive crisis) may cause: Headache. Fast or uneven heartbeats (palpitations). Shortness of breath. Nosebleed. Vomiting or feeling like you may vomit (nauseous). Changes in how you see. Very bad chest pain. Feeling dizzy. Seizures. How is this treated? This condition is treated by making healthy lifestyle changes, such as: Eating healthy foods. Exercising more. Drinking less alcohol. Your doctor may prescribe medicine if lifestyle changes do not help enough and if: Your top number is above 130. Your bottom number is above 80. Your personal target blood pressure may vary. Follow these instructions at home: Eating and drinking  If told, follow the DASH eating plan. To follow this plan: Fill one half of your plate at each meal with fruits and vegetables. Fill one fourth of your plate  at each meal with whole grains. Whole grains include whole-wheat pasta, brown rice, and whole-grain bread. Eat or drink low-fat dairy products, such as skim milk or low-fat yogurt. Fill one fourth of your plate at each meal with low-fat (lean) proteins. Low-fat proteins include fish, chicken without skin, eggs, beans, and tofu. Avoid fatty meat, cured and processed meat, or chicken with skin. Avoid pre-made or processed food. Limit the amount of salt in your diet to less than 1,500 mg each day. Do not drink alcohol if: Your doctor tells you not to drink. You are pregnant, may be pregnant, or are planning to become pregnant. If you drink alcohol: Limit how much you have to: 0-1 drink a day for women. 0-2 drinks a day for men. Know how much alcohol is in your drink. In the [place] one drink equals one 12 oz bottle of beer (355 mL), one 5 oz glass of wine (148 mL), or one 1 oz glass of hard liquor (44 mL). Lifestyle  Work with your doctor to stay at a healthy weight or to lose weight. Ask your doctor what the best weight is for you. Get at least 30 minutes of exercise that causes your heart to beat faster (aerobic exercise) most days of the week. This may include walking, swimming, or biking. Get at least 30 minutes of exercise that strengthens your muscles (resistance exercise) at least 3 days a week. This may include lifting weights or doing Pilates. Do not smoke or use any products that contain nicotine or tobacco. If you need help quitting, ask your doctor. Check your blood pressure at home as told by your doctor. Keep all follow-up visits. Medicines Take over-the-counter and prescription medicines  only as told by your doctor. Follow directions carefully. Do not skip doses of blood pressure medicine. The medicine does not work as well if you skip doses. Skipping doses also puts you at risk for problems. Ask your doctor about side effects or reactions to medicines that you should watch  for. Contact a doctor if: You think you are having a reaction to the medicine you are taking. You have headaches that keep coming back. You feel dizzy. You have swelling in your ankles. You have trouble with your vision. Get help right away if: You get a very bad headache. You start to feel mixed up (confused). You feel weak or numb. You feel faint. You have very bad pain in your: Chest. Belly (abdomen). You vomit more than once. You have trouble breathing. These symptoms may be an emergency. Get help right away. Call 911. Do not wait to see if the symptoms will go away. Do not drive yourself to the hospital. Summary Hypertension is another name for high blood pressure. High blood pressure forces your heart to work harder to pump blood. For most people, a normal blood pressure is less than 120/80. Making healthy choices can help lower blood pressure. If your blood pressure does not get lower with healthy choices, you may need to take medicine. This information is not intended to replace advice given to you by your health care provider. Make sure you discuss any questions you have with your health care provider. Document Revised: 01/05/2021 Document Reviewed: 01/05/2021 Elsevier Patient Education  2024 [employer].

## 2023-07-29 NOTE — Assessment & Plan Note (Signed)
 Chronic, currently taking atorvastatin  20mg  daily. Encouraged to follow heart healthy lifestyle.   Lab Results  Component Value Date   CHOL 144 10/24/2022   CHOL 262 (H) 06/12/2022   CHOL 166 01/19/2022   Lab Results  Component Value Date   HDL 47 10/24/2022   HDL 61 06/12/2022   HDL 46 01/19/2022   Lab Results  Component Value Date   LDLCALC 80 10/24/2022   LDLCALC 185 (H) 06/12/2022   LDLCALC 103 (H) 01/19/2022   Lab Results  Component Value Date   TRIG 90 10/24/2022   TRIG 93 06/12/2022   TRIG 94 01/19/2022   Lab Results  Component Value Date   CHOLHDL 3.6 01/19/2022   CHOLHDL 3.9 01/05/2021   CHOLHDL 3.1 12/31/2019   No results found for: "LDLDIRECT"

## 2023-07-29 NOTE — Assessment & Plan Note (Signed)
 Chronic, she is encouraged to keep BP well controlled and to stay well hydrated to decrease risk of CKD progression.

## 2023-07-29 NOTE — Assessment & Plan Note (Addendum)
 She is aware of 5lb weight gain.  She is encouraged to strive for BMI less than 30 to decrease cardiac risk. Advised to aim for at least 150 minutes of exercise per week.

## 2023-07-30 ENCOUNTER — Encounter: Payer: Self-pay | Admitting: Internal Medicine

## 2023-07-30 LAB — CBC
Hematocrit: 37.6 % (ref 34.0–46.6)
Hemoglobin: 12.5 g/dL (ref 11.1–15.9)
MCH: 28 pg (ref 26.6–33.0)
MCHC: 33.2 g/dL (ref 31.5–35.7)
MCV: 84 fL (ref 79–97)
Platelets: 344 10*3/uL (ref 150–450)
RBC: 4.46 x10E6/uL (ref 3.77–5.28)
RDW: 14.9 % (ref 11.7–15.4)
WBC: 4.9 10*3/uL (ref 3.4–10.8)

## 2023-07-30 LAB — LIPID PANEL
Chol/HDL Ratio: 2.8 ratio (ref 0.0–4.4)
Cholesterol, Total: 168 mg/dL (ref 100–199)
HDL: 61 mg/dL (ref >39)
LDL Chol Calc (NIH): 94 mg/dL (ref 0–99)
Triglycerides: 69 mg/dL (ref 0–149)
VLDL Cholesterol Cal: 13 mg/dL (ref 5–40)

## 2023-07-30 LAB — CMP14+EGFR
ALT: 12 IU/L (ref 0–32)
AST: 22 IU/L (ref 0–40)
Albumin: 4.3 g/dL (ref 3.8–4.8)
Alkaline Phosphatase: 75 IU/L (ref 44–121)
BUN/Creatinine Ratio: 16 (ref 12–28)
BUN: 13 mg/dL (ref 8–27)
Bilirubin Total: 0.5 mg/dL (ref 0.0–1.2)
CO2: 22 mmol/L (ref 20–29)
Calcium: 10 mg/dL (ref 8.7–10.3)
Chloride: 103 mmol/L (ref 96–106)
Creatinine, Ser: 0.79 mg/dL (ref 0.57–1.00)
Globulin, Total: 3.2 g/dL (ref 1.5–4.5)
Glucose: 88 mg/dL (ref 70–99)
Potassium: 4.2 mmol/L (ref 3.5–5.2)
Sodium: 142 mmol/L (ref 134–144)
Total Protein: 7.5 g/dL (ref 6.0–8.5)
eGFR: 77 mL/min/{1.73_m2} (ref >59)

## 2023-07-30 LAB — HEMOGLOBIN A1C
Est. average glucose Bld gHb Est-mCnc: 128 mg/dL
Hgb A1c MFr Bld: 6.1 % — ABNORMAL HIGH (ref 4.8–5.6)

## 2023-07-30 LAB — VITAMIN D 25 HYDROXY (VIT D DEFICIENCY, FRACTURES): Vit D, 25-Hydroxy: 34.3 ng/mL (ref 30.0–100.0)

## 2023-08-03 ENCOUNTER — Encounter: Payer: Self-pay | Admitting: Internal Medicine

## 2023-08-04 ENCOUNTER — Encounter: Payer: Self-pay | Admitting: Internal Medicine

## 2023-08-18 ENCOUNTER — Encounter: Payer: Self-pay | Admitting: Internal Medicine

## 2023-12-23 DIAGNOSIS — Z1231 Encounter for screening mammogram for malignant neoplasm of breast: Secondary | ICD-10-CM | POA: Diagnosis not present

## 2023-12-23 LAB — HM MAMMOGRAPHY

## 2024-01-01 ENCOUNTER — Encounter (INDEPENDENT_AMBULATORY_CARE_PROVIDER_SITE_OTHER): Payer: Self-pay

## 2024-01-16 ENCOUNTER — Ambulatory Visit: Payer: Medicare HMO

## 2024-01-22 ENCOUNTER — Ambulatory Visit

## 2024-01-22 VITALS — BP 128/64 | HR 71 | Temp 98.5°F | Ht 60.0 in | Wt 162.8 lb

## 2024-01-22 DIAGNOSIS — Z0000 Encounter for general adult medical examination without abnormal findings: Secondary | ICD-10-CM

## 2024-01-22 NOTE — Progress Notes (Signed)
 Subjective:   Sara Obrien is a 78 y.o. who presents for a Medicare Wellness preventive visit.  As a reminder, Annual Wellness Visits don't include a physical exam, and some assessments may be limited, especially if this visit is performed virtually. We may recommend an in-person follow-up visit with your provider if needed.  Visit Complete: In person    Persons Participating in Visit: Patient.  AWV Questionnaire: No: Patient Medicare AWV questionnaire was not completed prior to this visit.  Cardiac Risk Factors include: advanced age (>67men, >23 women);dyslipidemia;hypertension     Objective:    Today's Vitals   01/22/24 1554  BP: 128/64  Pulse: 71  Temp: 98.5 F (36.9 C)  TempSrc: Oral  SpO2: 98%  Weight: 162 lb 12.8 oz (73.8 kg)  Height: 5' (1.524 m)   Body mass index is 31.79 kg/m.     01/22/2024    4:02 PM 01/10/2023   12:26 PM 12/30/2021    2:56 PM 12/29/2020    8:41 AM 12/31/2019   11:23 AM 12/16/2018    2:57 PM  Advanced Directives  Does Patient Have a Medical Advance Directive? No No Yes Yes No No  Type of Advance Directive    Living will    Does patient want to make changes to medical advance directive?   Yes (ED - Information included in AVS)     Would patient like information on creating a medical advance directive? No - Patient declined    No - Patient declined     Current Medications (verified) Outpatient Encounter Medications as of 01/22/2024  Medication Sig   aspirin EC 81 MG tablet Take 81 mg by mouth daily. Swallow whole.   atorvastatin  (LIPITOR) 20 MG tablet TAKE 1 TABLET EVERY DAY   diphenhydramine-acetaminophen  (TYLENOL  PM) 25-500 MG TABS tablet Take 1 tablet by mouth at bedtime as needed.   escitalopram  (LEXAPRO ) 20 MG tablet TAKE 1 TABLET EVERY DAY   levocetirizine (XYZAL ) 5 MG tablet Take 1 tablet (5 mg total) by mouth every evening.   Menthol, Topical Analgesic, (ICY HOT) 7.5 % (Roll) MISC Apply topically as needed.    olmesartan -hydrochlorothiazide (BENICAR  HCT) 20-12.5 MG tablet TAKE 1 TABLET EVERY DAY   Omega-3 Fatty Acids (FISH OIL ) 1200 MG CAPS Take 3 tabs daily   OVER THE COUNTER MEDICATION Koi CBD roll on   No facility-administered encounter medications on file as of 01/22/2024.    Allergies (verified) Patient has no known allergies.   History: Past Medical History:  Diagnosis Date   Anxiety    Atypical chest pain 07/01/2017   Constipation    Depression    Essential hypertension 07/01/2017   Gum disease    dentures worn   History of swelling of feet    Hyperlipidemia    Hypertension    Knee pain    Low back pain    Migraines    Overweight    Past Surgical History:  Procedure Laterality Date   BREAST LUMPECTOMY     TUBAL LIGATION     Family History  Problem Relation Age of Onset   Brain cancer Mother    Hypertension Mother    Diabetes Mother    High Cholesterol Mother    Heart disease Mother    Stroke Mother    Depression Mother    Stroke Father    Hypertension Father    Heart disease Father    Sudden death Father    Hypertension Sister    Breast cancer Sister  Cancer Brother    Cancer Brother    Cancer Brother    Social History   Socioeconomic History   Marital status: Married    Spouse name: Not on file   Number of children: Not on file   Years of education: Not on file   Highest education level: Master's degree (e.g., MA, MS, MEng, MEd, MSW, MBA)  Occupational History   Occupation: semi- retired  Tobacco Use   Smoking status: Never    Passive exposure: Never   Smokeless tobacco: Never  Vaping Use   Vaping status: Never Used  Substance and Sexual Activity   Alcohol use: Yes    Comment: glass of wine occ   Drug use: No   Sexual activity: Not Currently    Birth control/protection: Abstinence  Other Topics Concern   Not on file  Social History Narrative   Not on file   Social Drivers of Health   Financial Resource Strain: Low Risk  (01/22/2024)    Overall Financial Resource Strain (CARDIA)    Difficulty of Paying Living Expenses: Not hard at all  Food Insecurity: No Food Insecurity (01/22/2024)   Hunger Vital Sign    Worried About Running Out of Food in the Last Year: Never true    Ran Out of Food in the Last Year: Never true  Transportation Needs: No Transportation Needs (01/22/2024)   PRAPARE - Administrator, Civil Service (Medical): No    Lack of Transportation (Non-Medical): No  Physical Activity: Sufficiently Active (01/22/2024)   Exercise Vital Sign    Days of Exercise per Week: 7 days    Minutes of Exercise per Session: 90 min  Stress: No Stress Concern Present (01/22/2024)   [employer] of Occupational Health - Occupational Stress Questionnaire    Feeling of Stress: Not at all  Social Connections: Socially Integrated (01/22/2024)   Social Connection and Isolation Panel    Frequency of Communication with Friends and Family: More than three times a week    Frequency of Social Gatherings with Friends and Family: Three times a week    Attends Religious Services: More than 4 times per year    Active Member of Clubs or Organizations: Yes    Attends Engineer, structural: More than 4 times per year    Marital Status: Married    Tobacco Counseling Counseling given: Not Answered    Clinical Intake:  Pre-visit preparation completed: Yes  Pain : No/denies pain     Nutritional Status: BMI > 30  Obese Nutritional Risks: None Diabetes: No  Lab Results  Component Value Date   HGBA1C 6.1 (H) 07/29/2023   HGBA1C 6.0 (H) 01/28/2023   HGBA1C 6.1 (H) 10/23/2022     How often do you need to have someone help you when you read instructions, pamphlets, or other written materials from your doctor or pharmacy?: 1 - Never  Interpreter Needed?: No  Information entered by :: NAllen LPN   Activities of Daily Living     01/22/2024    3:56 PM  In your present state of health, do you have any  difficulty performing the following activities:  Hearing? 0  Vision? 0  Difficulty concentrating or making decisions? 0  Walking or climbing stairs? 0  Dressing or bathing? 0  Doing errands, shopping? 0  Preparing Food and eating ? N  Using the Toilet? N  In the past six months, have you accidently leaked urine? N  Do you have problems with loss  of bowel control? N  Managing your Medications? N  Managing your Finances? N  Housekeeping or managing your Housekeeping? N    Patient Care Team: Jarold Medici, MD as PCP - General (Internal Medicine)  I have updated your Care Teams any recent Medical Services you may have received from other providers in the past year.     Assessment:   This is a routine wellness examination for [facility].  Hearing/Vision screen Hearing Screening - Comments:: Denies hearing issues Vision Screening - Comments:: Regular eye exams, [facility]   Goals Addressed             This Visit's Progress    Patient Stated       01/22/2024, wants to weigh 150 pounds       Depression Screen     01/22/2024    4:03 PM 07/29/2023    9:05 AM 01/10/2023   12:28 PM 07/23/2022    9:14 AM 01/19/2022    2:18 PM 12/30/2021    2:56 PM 12/30/2021    2:55 PM  PHQ 2/9 Scores  PHQ - 2 Score 0 0 0 0 0 0 0  PHQ- 9 Score 1 0 0 0 0      Fall Risk     01/22/2024    4:03 PM 07/29/2023    9:04 AM 01/10/2023   12:27 PM 07/23/2022    9:14 AM 12/30/2021    2:56 PM  Fall Risk   Falls in the past year? 0 0 0 0 0  Number falls in past yr: 0 0 0 0 0  Injury with Fall? 0 0 0 0 0  Risk for fall due to : Medication side effect No Fall Risks Medication side effect No Fall Risks No Fall Risks  Follow up Falls evaluation completed;Falls prevention discussed Falls evaluation completed Falls prevention discussed;Falls evaluation completed Falls evaluation completed Falls evaluation completed      Data saved with a previous flowsheet row definition    MEDICARE RISK AT HOME:   Medicare Risk at Home Any stairs in or around the home?: Yes If so, are there any without handrails?: No Home free of loose throw rugs in walkways, pet beds, electrical cords, etc?: Yes Adequate lighting in your home to reduce risk of falls?: Yes Life alert?: No Use of a cane, walker or w/c?: No Grab bars in the bathroom?: Yes Shower chair or bench in shower?: Yes Elevated toilet seat or a handicapped toilet?: Yes  TIMED UP AND GO:  Was the test performed?  Yes  Length of time to ambulate 10 feet: 5 sec Gait steady and fast without use of assistive device  Cognitive Function: 6CIT completed        01/22/2024    4:04 PM 01/10/2023   12:28 PM 12/30/2021    2:57 PM 12/29/2020    8:43 AM 12/31/2019   11:25 AM  6CIT Screen  What Year? 0 points 0 points 0 points 0 points 0 points  What month? 0 points 0 points 0 points 0 points 0 points  What time? 0 points 0 points 0 points 0 points 0 points  Count back from 20 0 points 0 points 0 points 0 points 0 points  Months in reverse 2 points 0 points 0 points 0 points 0 points  Repeat phrase 0 points 2 points 0 points 2 points 2 points  Total Score 2 points 2 points 0 points 2 points 2 points    Immunizations Immunization History  Administered Date(s) Administered   Fluad Quad(high Dose 65+) 12/10/2019, 12/28/2022, 12/21/2023   INFLUENZA, HIGH DOSE SEASONAL PF 12/17/2018, 12/13/2020   Influenza-Unspecified 12/01/2020, 12/18/2021   Moderna Covid-19 Fall Seasonal Vaccine 60yrs & older 12/05/2022   PFIZER(Purple Top)SARS-COV-2 Vaccination 05/08/2019, 06/02/2019, 02/13/2020, 07/22/2020   PNEUMOCOCCAL CONJUGATE-20 08/08/2021   Pfizer Covid-19 Vaccine Bivalent Booster 27yrs & up 03/20/2021   Pfizer(Comirnaty )Fall Seasonal Vaccine 12 years and older 03/20/2022   Tdap 09/22/2013   Zoster Recombinant(Shingrix ) 01/19/2022   Zoster, Live 07/01/2016    Screening Tests Health Maintenance  Topic Date Due   Zoster Vaccines- Shingrix  (2 of  2) 03/16/2022   DTaP/Tdap/Td (2 - Td or Tdap) 09/23/2023   COVID-19 Vaccine (8 - 2025-26 season) 12/02/2023   Mammogram  12/22/2024   Medicare Annual Wellness (AWV)  01/21/2025   Pneumococcal Vaccine: 50+ Years  Completed   Influenza Vaccine  Completed   DEXA SCAN  Completed   Hepatitis C Screening  Completed   Meningococcal B Vaccine  Aged Out   Colonoscopy  Discontinued    Health Maintenance Items Addressed: Will get vaccines at next visit next week.  Additional Screening:  Vision Screening: Recommended annual ophthalmology exams for early detection of glaucoma and other disorders of the eye. Is the patient up to date with their annual eye exam?  Yes  Who is the provider or what is the name of the office in which the patient attends annual eye exams? [facility] [place]  Dental Screening: Recommended annual dental exams for proper oral hygiene  Community Resource Referral / Chronic Care Management: CRR required this visit?  No   CCM required this visit?  No   Plan:    I have personally reviewed and noted the following in the patient's chart:   Medical and social history Use of alcohol, tobacco or illicit drugs  Current medications and supplements including opioid prescriptions. Patient is not currently taking opioid prescriptions. Functional ability and status Nutritional status Physical activity Advanced directives List of other physicians Hospitalizations, surgeries, and ER visits in previous 12 months Vitals Screenings to include cognitive, depression, and falls Referrals and appointments  In addition, I have reviewed and discussed with patient certain preventive protocols, quality metrics, and best practice recommendations. A written personalized care plan for preventive services as well as general preventive health recommendations were provided to patient.   Ardella FORBES Dawn, LPN   [id number]   After Visit Summary: (In Person-Declined) Patient declined AVS at this  time.  Notes: Nothing significant to report at this time.

## 2024-01-22 NOTE — Patient Instructions (Signed)
 Sara Obrien,  Thank you for taking the time for your Medicare Wellness Visit. I appreciate your continued commitment to your health goals. Please review the care plan we discussed, and feel free to reach out if I can assist you further.  Medicare recommends these wellness visits once per year to help you and your care team stay ahead of potential health issues. These visits are designed to focus on prevention, allowing your provider to concentrate on managing your acute and chronic conditions during your regular appointments.  Please note that Annual Wellness Visits do not include a physical exam. Some assessments may be limited, especially if the visit was conducted virtually. If needed, we may recommend a separate in-person follow-up with your provider.  Ongoing Care Seeing your primary care provider every 3 to 6 months helps us  monitor your health and provide consistent, personalized care.   Referrals If a referral was made during today's visit and you haven't received any updates within two weeks, please contact the referred provider directly to check on the status.  Recommended Screenings:  Health Maintenance  Topic Date Due   Zoster (Shingles) Vaccine (2 of 2) 03/16/2022   DTaP/Tdap/Td vaccine (2 - Td or Tdap) 09/23/2023   COVID-19 Vaccine (8 - 2025-26 season) 12/02/2023   Breast Cancer Screening  12/22/2024   Medicare Annual Wellness Visit  01/21/2025   Pneumococcal Vaccine for age over 72  Completed   Flu Shot  Completed   DEXA scan (bone density measurement)  Completed   Hepatitis C Screening  Completed   Meningitis B Vaccine  Aged Out   Colon Cancer Screening  Discontinued       01/22/2024    4:02 PM  Advanced Directives  Does Patient Have a Medical Advance Directive? No  Would patient like information on creating a medical advance directive? No - Patient declined   Advance Care Planning is important because it: Ensures you receive medical care that aligns with your  values, goals, and preferences. Provides guidance to your family and loved ones, reducing the emotional burden of decision-making during critical moments.  Vision: Annual vision screenings are recommended for early detection of glaucoma, cataracts, and diabetic retinopathy. These exams can also reveal signs of chronic conditions such as diabetes and high blood pressure.  Dental: Annual dental screenings help detect early signs of oral cancer, gum disease, and other conditions linked to overall health, including heart disease and diabetes.  Please see the attached documents for additional preventive care recommendations.

## 2024-01-30 ENCOUNTER — Ambulatory Visit: Payer: Self-pay | Admitting: Internal Medicine

## 2024-01-30 ENCOUNTER — Encounter: Payer: Self-pay | Admitting: Internal Medicine

## 2024-01-30 VITALS — BP 130/80 | HR 60 | Temp 98.3°F | Ht 60.0 in | Wt 164.2 lb

## 2024-01-30 DIAGNOSIS — Z23 Encounter for immunization: Secondary | ICD-10-CM | POA: Diagnosis not present

## 2024-01-30 DIAGNOSIS — R202 Paresthesia of skin: Secondary | ICD-10-CM

## 2024-01-30 DIAGNOSIS — R7309 Other abnormal glucose: Secondary | ICD-10-CM | POA: Diagnosis not present

## 2024-01-30 DIAGNOSIS — M79609 Pain in unspecified limb: Secondary | ICD-10-CM | POA: Diagnosis not present

## 2024-01-30 DIAGNOSIS — E7800 Pure hypercholesterolemia, unspecified: Secondary | ICD-10-CM

## 2024-01-30 DIAGNOSIS — N182 Chronic kidney disease, stage 2 (mild): Secondary | ICD-10-CM

## 2024-01-30 DIAGNOSIS — H6121 Impacted cerumen, right ear: Secondary | ICD-10-CM

## 2024-01-30 DIAGNOSIS — E2839 Other primary ovarian failure: Secondary | ICD-10-CM

## 2024-01-30 DIAGNOSIS — Z0000 Encounter for general adult medical examination without abnormal findings: Secondary | ICD-10-CM | POA: Diagnosis not present

## 2024-01-30 DIAGNOSIS — I129 Hypertensive chronic kidney disease with stage 1 through stage 4 chronic kidney disease, or unspecified chronic kidney disease: Secondary | ICD-10-CM | POA: Diagnosis not present

## 2024-01-30 LAB — POCT URINALYSIS DIP (CLINITEK)
Bilirubin, UA: NEGATIVE
Glucose, UA: NEGATIVE mg/dL
Ketones, POC UA: NEGATIVE mg/dL
Leukocytes, UA: NEGATIVE
Nitrite, UA: NEGATIVE
POC PROTEIN,UA: 30 — AB
Spec Grav, UA: 1.02 (ref 1.010–1.025)
Urobilinogen, UA: 0.2 U/dL
pH, UA: 7.5 (ref 5.0–8.0)

## 2024-01-30 MED ORDER — ATORVASTATIN CALCIUM 20 MG PO TABS: 20.0000 mg | ORAL_TABLET | Freq: Every day | ORAL | 3 refills | Status: DC

## 2024-01-30 NOTE — Progress Notes (Signed)
 I,Victoria T Emmitt, CMA,acting as a neurosurgeon for Catheryn LOISE Slocumb, MD.,have documented all relevant documentation on the behalf of Catheryn LOISE Slocumb, MD,as directed by  Catheryn LOISE Slocumb, MD while in the presence of Catheryn LOISE Slocumb, MD.  Subjective:  Patient ID: Sara Obrien , female    DOB: 02/11/1946 , 78 y.o.   MRN: [id number]  Chief Complaint  Patient presents with   Annual Exam    Patient here for HM. She reports compliance with meds. She denies headaches, chest pain and shortness of breath.  She states getting a Q tip cotton piece stuck in her left ear.   Hypertension   Hyperlipidemia    HPI Discussed the use of AI scribe software for clinical note transcription with the patient, who gave verbal consent to proceed.  History of Present Illness Sara Obrien is a 78 year old female with arthritis who presents with arm pain and numbness.  She experiences significant aching in her arms, with numbness and tingling in her fingers, described as 'needles going through' her arms. The symptoms affect both arms but are more pronounced in the right arm. The symptoms began after receiving a shot last year, which she believes may be related. The pain and tingling sometimes wake her up at night, feeling like her arms are 'on fire'.  She has a history of arthritis in her hands and recalls being told she had a positive ANA test. Her current medications include aspirin, atorvastatin  20 mg daily, Lexapro  20 mg, levocetirizine for allergies, and olmesartan  hydrochlorothiazide 20/12.5 mg.  In her social history, she remains active, participating in [employer] and volunteering. She engages in social activities with a group of ladies who meet monthly for dinner. During the review of symptoms, she denies experiencing chest pain or shortness of breath. She confirms drinking plenty of water and has not eaten breakfast before the visit.   Hypertension This is a chronic problem. The current episode started more  than 1 year ago. The problem has been gradually improving since onset. The problem is controlled. Pertinent negatives include no blurred vision. Past treatments include angiotensin blockers and diuretics. The current treatment provides moderate improvement. There are no compliance problems.  Hypertensive end-organ damage includes kidney disease.     Past Medical History:  Diagnosis Date   Anxiety    Atypical chest pain 07/01/2017   Constipation    Depression    Essential hypertension 07/01/2017   Gum disease    dentures worn   History of swelling of feet    Hyperlipidemia    Hypertension    Knee pain    Low back pain    Migraines    Overweight      Family History  Problem Relation Age of Onset   Brain cancer Mother    Hypertension Mother    Diabetes Mother    High Cholesterol Mother    Heart disease Mother    Stroke Mother    Depression Mother    Stroke Father    Hypertension Father    Heart disease Father    Sudden death Father    Hypertension Sister    Breast cancer Sister    Cancer Brother    Cancer Brother    Cancer Brother      Current Outpatient Medications:    aspirin EC 81 MG tablet, Take 81 mg by mouth daily. Swallow whole., Disp: , Rfl:    diphenhydramine-acetaminophen  (TYLENOL  PM) 25-500 MG TABS tablet, Take 1 tablet by  mouth at bedtime as needed., Disp: , Rfl:    escitalopram  (LEXAPRO ) 20 MG tablet, TAKE 1 TABLET EVERY DAY, Disp: 90 tablet, Rfl: 3   levocetirizine (XYZAL ) 5 MG tablet, Take 1 tablet (5 mg total) by mouth every evening., Disp: 90 tablet, Rfl: 1   Menthol, Topical Analgesic, (ICY HOT) 7.5 % (Roll) MISC, Apply topically as needed., Disp: , Rfl:    olmesartan -hydrochlorothiazide (BENICAR  HCT) 20-12.5 MG tablet, TAKE 1 TABLET EVERY DAY, Disp: 90 tablet, Rfl: 3   Omega-3 Fatty Acids (FISH OIL ) 1200 MG CAPS, Take 3 tabs daily, Disp: , Rfl:    OVER THE COUNTER MEDICATION, Koi CBD roll on, Disp: , Rfl:    atorvastatin  (LIPITOR) 20 MG tablet, Take  1 tablet (20 mg total) by mouth daily., Disp: 90 tablet, Rfl: 3   No Known Allergies   Review of Systems  Constitutional: Negative.   HENT: Negative.    Eyes: Negative.  Negative for blurred vision.  Respiratory: Negative.    Cardiovascular: Negative.   Gastrointestinal: Negative.   Endocrine: Negative.   Genitourinary: Negative.   Musculoskeletal: Negative.   Skin: Negative.   Allergic/Immunologic: Negative.   Neurological: Negative.   Hematological: Negative.   Psychiatric/Behavioral: Negative.       Today's Vitals   01/30/24 0850  BP: 130/80  Pulse: 60  Temp: 98.3 F (36.8 C)  SpO2: 98%  Weight: 164 lb 3.2 oz (74.5 kg)  Height: 5' (1.524 m)   Body mass index is 32.07 kg/m.  Wt Readings from Last 3 Encounters:  01/30/24 164 lb 3.2 oz (74.5 kg)  01/22/24 162 lb 12.8 oz (73.8 kg)  07/29/23 161 lb 3.2 oz (73.1 kg)     Objective:  Physical Exam Vitals and nursing note reviewed.  Constitutional:      Appearance: Normal appearance.  HENT:     Head: Normocephalic and atraumatic.     Right Ear: Tympanic membrane, ear canal and external ear normal.     Left Ear: Tympanic membrane, ear canal and external ear normal.     Nose: Nose normal.     Mouth/Throat:     Mouth: Mucous membranes are moist.     Pharynx: Oropharynx is clear.  Eyes:     Extraocular Movements: Extraocular movements intact.     Conjunctiva/sclera: Conjunctivae normal.     Pupils: Pupils are equal, round, and reactive to light.  Cardiovascular:     Rate and Rhythm: Normal rate and regular rhythm.     Pulses: Normal pulses.          Dorsalis pedis pulses are 2+ on the right side and 2+ on the left side.     Heart sounds: Normal heart sounds.  Pulmonary:     Effort: Pulmonary effort is normal.     Breath sounds: Normal breath sounds.  Chest:  Breasts:    Tanner Score is 5.     Right: Normal.     Left: Normal.     Comments: Tenderness to palpation, no overlying erythema Abdominal:      General: Abdomen is flat. Bowel sounds are normal.     Palpations: Abdomen is soft.  Genitourinary:    Comments: deferred Musculoskeletal:        General: Normal range of motion.     Cervical back: Normal range of motion and neck supple.     Comments:  Pos squeeze test on the left  Skin:    General: Skin is warm and dry.  Neurological:  General: No focal deficit present.     Mental Status: She is alert and oriented to person, place, and time.  Psychiatric:        Mood and Affect: Mood normal.        Behavior: Behavior normal.         Assessment And Plan:  Annual physical exam Assessment & Plan: A full exam was performed.  Importance of monthly self breast exams was discussed with the patient.  She is advised to get 30-45 minutes of regular exercise, no less than four to five days per week. Both weight-bearing and aerobic exercises are recommended.  She is advised to follow a healthy diet with at least six fruits/veggies per day, decrease intake of red meat and other saturated fats and to increase fish intake to twice weekly.  Meats/fish should not be fried -- baked, boiled or broiled is preferable. It is also important to cut back on your sugar intake.  Be sure to read labels - try to avoid anything with added sugar, high fructose corn syrup or other sweeteners.  If you must use a sweetener, you can try stevia or monkfruit.  It is also important to avoid artificially sweetened foods/beverages and diet drinks. Lastly, wear SPF 50 sunscreen on exposed skin and when in direct sunlight for an extended period of time.  Be sure to avoid fast food restaurants and aim for at least 60 ounces of water daily.       Hypertensive nephropathy Assessment & Plan: Chronic, fair control.  EKG performed, SB w/ nonspecific T abnormality.  She will continue with olmesartan /hct. Blood pressure slightly elevated, likely due to high-sodium diet. - Encouraged dietary sodium reduction. - Aim for at least 150  minutes of exercise per week.   Orders: -     CMP14+EGFR -     Lipid panel -     POCT URINALYSIS DIP (CLINITEK) -     Microalbumin / creatinine urine ratio -     EKG 12-Lead -     CBC  Chronic renal disease, stage II Assessment & Plan: Chronic, she is encouraged to keep BP well controlled and to stay well hydrated to decrease risk of CKD progression.     Pure hypercholesterolemia Assessment & Plan: Chronic, currently taking atorvastatin  20mg  daily. Encouraged to follow heart healthy lifestyle.  - LDL goal is less than 70    Orders: -     Lipid panel -     TSH  Paresthesia and pain of extremity Assessment & Plan: Aching pain, numbness, and tingling suggest possible nerve compression. Differential includes cervical radiculopathy, ulnar nerve entrapment, and carpal tunnel syndrome. - Refer to Dr. Tobie for nerve conduction study. - Consider referral to hand specialist based on study results.  Orders: -     Ambulatory referral to Neurology  Impacted cerumen of right ear Assessment & Plan: After obtaining verbal consent, both ears were flushed by irrigation. No TM abnormalities were noted. Left ear had cotton in it. He tolerated procedure well without any complications.     Orders: -     Ear Lavage  Other abnormal glucose Assessment & Plan: Previous labs reviewed, her A1c has been elevated in the past. I will check an A1c today. Reminded to avoid refined sugars including sugary drinks/foods and processed meats including bacon, sausages and deli meats.    Orders: -     CMP14+EGFR -     Hemoglobin A1c  Estrogen deficiency -     DG  Bone Density; Future  Immunization due Assessment & Plan: She was given Tdap x 1, billed via [employer].    Orders: -     Tdap vaccine greater than or equal to 7yo IM  Other orders -     Atorvastatin  Calcium ; Take 1 tablet (20 mg total) by mouth daily.  Dispense: 90 tablet; Refill: 3    Return for 1 YEAR HM, 6 month bp.  Patient  was given opportunity to ask questions. Patient verbalized understanding of the plan and was able to repeat key elements of the plan. All questions were answered to their satisfaction.   I, Catheryn LOISE Slocumb, MD, have reviewed all documentation for this visit. The documentation on 01/30/24 for the exam, diagnosis, procedures, and orders are all accurate and complete.   IF YOU HAVE BEEN REFERRED TO A SPECIALIST, IT MAY TAKE 1-2 WEEKS TO SCHEDULE/PROCESS THE REFERRAL. IF YOU HAVE NOT HEARD FROM US /SPECIALIST IN TWO WEEKS, PLEASE GIVE US  A CALL AT [phone] X [phone].   THE PATIENT IS ENCOURAGED TO PRACTICE SOCIAL DISTANCING DUE TO THE COVID-19 PANDEMIC.

## 2024-01-30 NOTE — Assessment & Plan Note (Signed)

## 2024-01-30 NOTE — Assessment & Plan Note (Addendum)
 Chronic, currently taking atorvastatin  20mg  daily. Encouraged to follow heart healthy lifestyle.  - LDL goal is less than 70

## 2024-01-30 NOTE — Assessment & Plan Note (Signed)
 Chronic, she is encouraged to keep BP well controlled and to stay well hydrated to decrease risk of CKD progression.

## 2024-01-30 NOTE — Patient Instructions (Signed)

## 2024-01-31 LAB — LIPID PANEL
Chol/HDL Ratio: 3 ratio (ref 0.0–4.4)
Cholesterol, Total: 189 mg/dL (ref 100–199)
HDL: 64 mg/dL (ref >39)
LDL Chol Calc (NIH): 109 mg/dL — ABNORMAL HIGH (ref 0–99)
Triglycerides: 89 mg/dL (ref 0–149)
VLDL Cholesterol Cal: 16 mg/dL (ref 5–40)

## 2024-01-31 LAB — CMP14+EGFR
ALT: 19 IU/L (ref 0–32)
AST: 22 IU/L (ref 0–40)
Albumin: 4 g/dL (ref 3.8–4.8)
Alkaline Phosphatase: 75 IU/L (ref 49–135)
BUN/Creatinine Ratio: 15 (ref 12–28)
BUN: 14 mg/dL (ref 8–27)
Bilirubin Total: 0.4 mg/dL (ref 0.0–1.2)
CO2: 25 mmol/L (ref 20–29)
Calcium: 9.9 mg/dL (ref 8.7–10.3)
Chloride: 102 mmol/L (ref 96–106)
Creatinine, Ser: 0.94 mg/dL (ref 0.57–1.00)
Globulin, Total: 3.5 g/dL (ref 1.5–4.5)
Glucose: 89 mg/dL (ref 70–99)
Potassium: 4.4 mmol/L (ref 3.5–5.2)
Sodium: 140 mmol/L (ref 134–144)
Total Protein: 7.5 g/dL (ref 6.0–8.5)
eGFR: 62 mL/min/1.73 (ref >59)

## 2024-01-31 LAB — CBC
Hematocrit: 38.1 % (ref 34.0–46.6)
Hemoglobin: 12.3 g/dL (ref 11.1–15.9)
MCH: 29.6 pg (ref 26.6–33.0)
MCHC: 32.3 g/dL (ref 31.5–35.7)
MCV: 92 fL (ref 79–97)
Platelets: 377 x10E3/uL (ref 150–450)
RBC: 4.16 x10E6/uL (ref 3.77–5.28)
RDW: 15.1 % (ref 11.7–15.4)
WBC: 5.2 x10E3/uL (ref 3.4–10.8)

## 2024-01-31 LAB — TSH: TSH: 1.01 u[IU]/mL (ref 0.450–4.500)

## 2024-01-31 LAB — HEMOGLOBIN A1C
Est. average glucose Bld gHb Est-mCnc: 120 mg/dL
Hgb A1c MFr Bld: 5.8 % — ABNORMAL HIGH (ref 4.8–5.6)

## 2024-01-31 LAB — MICROALBUMIN / CREATININE URINE RATIO
Creatinine, Urine: 224.8 mg/dL
Microalb/Creat Ratio: 8 mg/g{creat} (ref 0–29)
Microalbumin, Urine: 18.9 ug/mL

## 2024-02-02 DIAGNOSIS — H6121 Impacted cerumen, right ear: Secondary | ICD-10-CM | POA: Insufficient documentation

## 2024-02-02 DIAGNOSIS — Z23 Encounter for immunization: Secondary | ICD-10-CM | POA: Insufficient documentation

## 2024-02-02 DIAGNOSIS — R202 Paresthesia of skin: Secondary | ICD-10-CM | POA: Insufficient documentation

## 2024-02-02 NOTE — Assessment & Plan Note (Signed)
 She was given Tdap x 1, billed via [employer].

## 2024-02-02 NOTE — Assessment & Plan Note (Signed)
 Aching pain, numbness, and tingling suggest possible nerve compression. Differential includes cervical radiculopathy, ulnar nerve entrapment, and carpal tunnel syndrome. - Refer to Dr. Tobie for nerve conduction study. - Consider referral to hand specialist based on study results.

## 2024-02-02 NOTE — Assessment & Plan Note (Signed)
 After obtaining verbal consent, both ears were flushed by irrigation. No TM abnormalities were noted. Left ear had cotton in it. He tolerated procedure well without any complications.

## 2024-02-02 NOTE — Assessment & Plan Note (Signed)
 Chronic, fair control.  EKG performed, SB w/ nonspecific T abnormality.  She will continue with olmesartan /hct. Blood pressure slightly elevated, likely due to high-sodium diet. - Encouraged dietary sodium reduction. - Aim for at least 150 minutes of exercise per week.

## 2024-02-02 NOTE — Assessment & Plan Note (Signed)
 Previous labs reviewed, her A1c has been elevated in the past. I will check an A1c today. Reminded to avoid refined sugars including sugary drinks/foods and processed meats including bacon, sausages and deli meats.

## 2024-02-03 ENCOUNTER — Ambulatory Visit: Payer: Self-pay | Admitting: Internal Medicine

## 2024-02-03 DIAGNOSIS — R3129 Other microscopic hematuria: Secondary | ICD-10-CM

## 2024-02-11 ENCOUNTER — Ambulatory Visit
Admission: RE | Admit: 2024-02-11 | Discharge: 2024-02-11 | Disposition: A | Discharge status: Home / Self Care | Source: Ambulatory Visit | Attending: Internal Medicine | Admitting: Internal Medicine

## 2024-02-11 DIAGNOSIS — E669 Obesity, unspecified: Secondary | ICD-10-CM | POA: Diagnosis not present

## 2024-02-11 DIAGNOSIS — E785 Hyperlipidemia, unspecified: Secondary | ICD-10-CM | POA: Diagnosis not present

## 2024-02-11 DIAGNOSIS — R3129 Other microscopic hematuria: Secondary | ICD-10-CM

## 2024-02-11 DIAGNOSIS — F419 Anxiety disorder, unspecified: Secondary | ICD-10-CM | POA: Diagnosis not present

## 2024-02-11 DIAGNOSIS — M1990 Unspecified osteoarthritis, unspecified site: Secondary | ICD-10-CM | POA: Diagnosis not present

## 2024-02-11 DIAGNOSIS — F325 Major depressive disorder, single episode, in full remission: Secondary | ICD-10-CM | POA: Diagnosis not present

## 2024-02-11 DIAGNOSIS — Z833 Family history of diabetes mellitus: Secondary | ICD-10-CM | POA: Diagnosis not present

## 2024-02-11 DIAGNOSIS — I10 Essential (primary) hypertension: Secondary | ICD-10-CM | POA: Diagnosis not present

## 2024-02-11 DIAGNOSIS — Z8249 Family history of ischemic heart disease and other diseases of the circulatory system: Secondary | ICD-10-CM | POA: Diagnosis not present

## 2024-02-11 DIAGNOSIS — M519 Unspecified thoracic, thoracolumbar and lumbosacral intervertebral disc disorder: Secondary | ICD-10-CM | POA: Diagnosis not present

## 2024-02-12 ENCOUNTER — Ambulatory Visit: Payer: Self-pay | Admitting: Internal Medicine

## 2024-04-01 ENCOUNTER — Other Ambulatory Visit: Payer: Self-pay | Admitting: Internal Medicine

## 2024-04-14 ENCOUNTER — Encounter: Payer: Self-pay | Admitting: Internal Medicine

## 2024-04-21 NOTE — Progress Notes (Signed)
 "  Office Visit Note  Patient: Sara Obrien             Date of Birth: [birth year]           MRN: [id number]             PCP: Jarold Medici, MD Referring: Jarold Medici, MD Visit Date: 05/05/2024 Occupation: Data Unavailable  Subjective:  Pain in joints  History of Present Illness: Sara Obrien is a 79 y.o. female with osteoarthritis and positive ANA.  She returns today after her last visit in February 2025.  She states that her joint pain overall has been better however she has been having increased pain and discomfort in her hands especially at night.  She has been experiencing discomfort in the palm of her hands and also tingling in her both hands.  She uses a computer at work and at home.  She has been using carpal tunnel braces only intermittently.  She states that the left hip pain is better.  She denies discomfort in her left knee today.  Lower back pain has improved.  She has noticed improvement in the muscle pain.  She had physical therapy at [facility] which helped with the lower back pain.    Activities of Daily Living:  Patient reports morning stiffness for 30 minutes.   Patient Denies nocturnal pain.  Difficulty dressing/grooming: Denies Difficulty climbing stairs: Denies Difficulty getting out of chair: Denies Difficulty using hands for taps, buttons, cutlery, and/or writing: Denies  Review of Systems  Constitutional:  Negative for fatigue.  HENT:  Negative for mouth sores and mouth dryness.   Eyes:  Negative for dryness.  Respiratory:  Negative for shortness of breath.   Cardiovascular:  Negative for chest pain and palpitations.  Gastrointestinal:  Negative for blood in stool, constipation and diarrhea.  Endocrine: Negative for increased urination.  Genitourinary:  Negative for involuntary urination.  Musculoskeletal:  Positive for joint pain, joint pain and morning stiffness. Negative for gait problem, joint swelling, myalgias, muscle weakness, muscle  tenderness and myalgias.  Skin:  Negative for color change, rash, hair loss and sensitivity to sunlight.  Allergic/Immunologic: Negative for susceptible to infections.  Neurological:  Positive for dizziness, numbness, headaches and parasthesias.  Hematological:  Negative for swollen glands.  Psychiatric/Behavioral:  Negative for depressed mood and sleep disturbance. The patient is not nervous/anxious.     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   Chronic left-sided low back pain with left-sided sciatica 03/13/2023   Chronic pain of left knee 03/13/2023   Annual physical exam 01/28/2023   Bilateral hand pain 01/28/2023   Chest wall tenderness 01/28/2023   Paresthesia and pain of left extremity 07/23/2022   Elevated vitamin B12 level 06/26/2022   Vitamin D  deficiency 06/26/2022   Other fatigue 06/12/2022   SOB (shortness of breath) on exertion 06/12/2022   Class 1 obesity with serious comorbidity and body mass index (BMI) of 31.0 to 31.9 in adult 06/12/2022   Hypertension due to endocrine disorder 06/12/2022   Generalized OA 06/12/2022   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/12/2022   Sleep disturbance 06/12/2022   Prediabetes 05/28/2022   Generalized obesity 05/28/2022   Acute non-recurrent maxillary sinusitis 07/18/2021   Myalgia 01/10/2021   Microscopic hematuria 01/10/2021  Other abnormal glucose 01/10/2021   Hypertensive nephropathy 12/31/2019   Chronic renal disease, stage II 12/31/2019   Vitamin D  deficiency disease 12/31/2019   Class 1 obesity due to excess calories with serious comorbidity and body mass index (BMI) of 30.0 to 30.9 in adult 12/31/2019   Atypical chest pain 07/01/2017   Essential hypertension 07/01/2017   Hyperlipidemia  07/01/2017    Past Medical History:  Diagnosis Date   Anxiety    Atypical chest pain 07/01/2017   Constipation    Depression    Essential hypertension 07/01/2017   Gum disease    dentures worn   History of swelling of feet    Hyperlipidemia    Hypertension    Knee pain    Low back pain    Migraines    Overweight     Family History  Problem Relation Age of Onset   Brain cancer Mother    Hypertension Mother    Diabetes Mother    High Cholesterol Mother    Heart disease Mother    Stroke Mother    Depression Mother    Stroke Father    Hypertension Father    Heart disease Father    Sudden death Father    Hypertension Sister    Breast cancer Sister    Cancer Brother    Cancer Brother    Cancer Brother    Past Surgical History:  Procedure Laterality Date   BREAST LUMPECTOMY     TUBAL LIGATION     Social History[1] Social History   Social History Narrative   Not on file     Immunization History  Administered Date(s) Administered   Fluad Quad(high Dose 65+) 12/10/2019, 12/28/2022, 12/21/2023   INFLUENZA, HIGH DOSE SEASONAL PF 12/17/2018, 12/13/2020   Influenza-Unspecified 12/01/2020, 12/18/2021   Moderna Covid-19 Fall Seasonal Vaccine 3yrs & older 12/05/2022   PFIZER(Purple Top)SARS-COV-2 Vaccination 05/08/2019, 06/02/2019, 02/13/2020, 07/22/2020   PNEUMOCOCCAL CONJUGATE-20 08/08/2021   Pfizer Covid-19 Vaccine Bivalent Booster 30yrs & up 03/20/2021   Pfizer(Comirnaty )Fall Seasonal Vaccine 12 years and older 03/20/2022   Tdap 09/22/2013, 01/30/2024   Zoster Recombinant(Shingrix ) 01/19/2022   Zoster, Live 07/01/2016     Objective: Vital Signs: BP (!) 149/83   Pulse (!) 56   Temp 97.8 F (36.6 C)   Resp 15   Ht 5' 1 (1.549 m)   Wt 160 lb (72.6 kg)   BMI 30.23 kg/m    Physical Exam Vitals and nursing note reviewed.  Constitutional:      Appearance: She is well-developed.  HENT:     Head: Normocephalic and atraumatic.  Eyes:      Conjunctiva/sclera: Conjunctivae normal.  Cardiovascular:     Rate and Rhythm: Normal rate and regular rhythm.     Heart sounds: Normal heart sounds.  Pulmonary:     Effort: Pulmonary effort is normal.     Breath sounds: Normal breath sounds.  Abdominal:     General: Bowel sounds are normal.     Palpations: Abdomen is soft.  Musculoskeletal:     Cervical back: Normal range of motion.  Skin:    General: Skin is warm and dry.     Capillary Refill: Capillary refill takes less than 2 seconds.  Neurological:     Mental Status: She is alert and oriented to person, place, and time.  Psychiatric:        Behavior: Behavior normal.      Musculoskeletal Exam: Cervical, thoracic and lumbar spine were in good range of motion.  There was no SI joint tenderness.  Shoulder joints, elbow joints, wrist joints, MCPs, PIPs and DIPs were in good range of motion with no synovitis.  She has some limitation with abduction of the left hip joint without discomfort.  Right hip joint, knee joints were in good range of motion without any warmth swelling or effusion.  There was no tenderness over ankles or MTPs.   CDAI Exam: CDAI Score: -- Patient Global: --; Provider Global: -- Swollen: --; Tender: -- Joint Exam 05/05/2024   No joint exam has been documented for this visit   There is currently no information documented on the homunculus. Go to the Rheumatology activity and complete the homunculus joint exam.  Investigation: No additional findings.  Imaging: No results found.  Recent Labs: Lab Results  Component Value Date   WBC 5.2 01/30/2024   HGB 12.3 01/30/2024   PLT 377 01/30/2024   NA 140 01/30/2024   K 4.4 01/30/2024   CL 102 01/30/2024   CO2 25 01/30/2024   GLUCOSE 89 01/30/2024   BUN 14 01/30/2024   CREATININE 0.94 01/30/2024   BILITOT 0.4 01/30/2024   ALKPHOS 75 01/30/2024   AST 22 01/30/2024   ALT 19 01/30/2024   PROT 7.5 01/30/2024   ALBUMIN 4.0 01/30/2024   CALCIUM  9.9  01/30/2024   GFRAA 73 12/31/2019    Speciality Comments: No specialty comments available.  Procedures:  No procedures performed Allergies: Patient has no known allergies.   Assessment / Plan:     Visit Diagnoses: Bilateral hand pain-patient states overall discomfort in her hands is improved.  Paresthesia of both hands-she complains of paresthesias in her bilateral hands and nocturnal pain.  She states she has carpal tunnel braces which she has used in the past and they are not very comfortable.  She has not played pickle ball since October.  I will refer her for nerve conduction velocity bilateral hands to evaluate for carpal tunnel syndrome.  I discussed the option of doing cortisone injection if the symptoms are mild to moderate.  If she has severe carpal tunnel syndrome she may need surgery.  Information regarding the carpal tunnel syndrome was provided in the AVS.  Pain in left hip -she had limited range of motion of the left hip joint without any discomfort.  X-rays obtained showed osteoarthritic changes  Chronic pain of left knee -she had good range of motion of bilateral knee joints today.  No warmth swelling or effusion was noted.  X-rays obtained showed moderate chondromalacia patella.  Chronic left-sided low back pain with left-sided sciatica -patient states she went for physical therapy at [facility] and the lower back pain has improved.  Previous MRI of the lumbar spine December 2024 showed multilevel spondylosis, facet joint arthropathy and mild to moderate spinal stenosis.  Positive ANA (antinuclear antibody) - ANA low titer positive, ENA negative, complements normal.  Patient gives history of fatigue and hair loss.  She has no clinical features of systemic lupus.  Myalgia -proved since the last visit.  History of myalgias since childhood.  No muscular pains or tenderness was noted.  CK was within normal limits.  Hair loss-she send generalized hair thinning which is not  worse.  Other medical problems are listed as follows:  Mixed hyperlipidemia  Hypertensive nephropathy  Essential hypertension-blood pressure was elevated at 149/83.  She was advised to monitor blood pressure closely and follow-up with her PCP.  Microscopic hematuria  Chronic renal disease, stage II  Osteopenia of multiple sites  Prediabetes  Vitamin D  deficiency  SOB (shortness of breath) on exertion  Mood disorder (HCC)- emotional eating  Sleep disturbance  Orders: No orders of the defined types were placed in this encounter.  No orders of the defined types were placed in this encounter.   Follow-Up Instructions: Return in about 1 year (around 05/05/2025) for Osteoarthritis.   Maya Nash, MD  Note - This record has been created using Animal nutritionist.  Chart creation errors have been sought, but may not always  have been located. Such creation errors do not reflect on  the standard of medical care.     [1]  Social History Tobacco Use   Smoking status: Never    Passive exposure: Never   Smokeless tobacco: Never  Vaping Use   Vaping status: Never Used  Substance Use Topics   Alcohol use: Yes    Comment: glass of wine occ   Drug use: No   "

## 2024-04-23 LAB — HM DEXA SCAN

## 2024-04-26 ENCOUNTER — Other Ambulatory Visit: Payer: Self-pay | Admitting: Internal Medicine

## 2024-04-28 ENCOUNTER — Encounter: Payer: Self-pay | Admitting: Internal Medicine

## 2024-04-28 ENCOUNTER — Other Ambulatory Visit: Payer: Self-pay

## 2024-04-28 ENCOUNTER — Ambulatory Visit: Payer: Self-pay | Admitting: Internal Medicine

## 2024-04-28 MED ORDER — ZOSTER VAC RECOMB ADJUVANTED 50 MCG/0.5ML IM SUSR
0.5000 mL | Freq: Once | INTRAMUSCULAR | 0 refills | Status: AC
  Filled 2024-04-28: qty 0.5, 1d supply, fill #0

## 2024-05-03 ENCOUNTER — Other Ambulatory Visit: Payer: Self-pay | Admitting: Internal Medicine

## 2024-05-03 DIAGNOSIS — F39 Unspecified mood [affective] disorder: Secondary | ICD-10-CM

## 2024-05-05 ENCOUNTER — Encounter: Payer: Self-pay | Admitting: Rheumatology

## 2024-05-05 ENCOUNTER — Ambulatory Visit: Payer: Medicare HMO | Admitting: Rheumatology

## 2024-05-05 VITALS — BP 149/83 | HR 56 | Temp 97.8°F | Resp 15 | Ht 61.0 in | Wt 160.0 lb

## 2024-05-05 DIAGNOSIS — G8929 Other chronic pain: Secondary | ICD-10-CM

## 2024-05-05 DIAGNOSIS — M791 Myalgia, unspecified site: Secondary | ICD-10-CM

## 2024-05-05 DIAGNOSIS — L659 Nonscarring hair loss, unspecified: Secondary | ICD-10-CM | POA: Diagnosis not present

## 2024-05-05 DIAGNOSIS — N182 Chronic kidney disease, stage 2 (mild): Secondary | ICD-10-CM

## 2024-05-05 DIAGNOSIS — M25562 Pain in left knee: Secondary | ICD-10-CM | POA: Diagnosis not present

## 2024-05-05 DIAGNOSIS — G479 Sleep disorder, unspecified: Secondary | ICD-10-CM

## 2024-05-05 DIAGNOSIS — R0602 Shortness of breath: Secondary | ICD-10-CM

## 2024-05-05 DIAGNOSIS — I129 Hypertensive chronic kidney disease with stage 1 through stage 4 chronic kidney disease, or unspecified chronic kidney disease: Secondary | ICD-10-CM | POA: Diagnosis not present

## 2024-05-05 DIAGNOSIS — E782 Mixed hyperlipidemia: Secondary | ICD-10-CM | POA: Diagnosis not present

## 2024-05-05 DIAGNOSIS — M79641 Pain in right hand: Secondary | ICD-10-CM

## 2024-05-05 DIAGNOSIS — M5442 Lumbago with sciatica, left side: Secondary | ICD-10-CM | POA: Diagnosis not present

## 2024-05-05 DIAGNOSIS — R7303 Prediabetes: Secondary | ICD-10-CM

## 2024-05-05 DIAGNOSIS — R3129 Other microscopic hematuria: Secondary | ICD-10-CM | POA: Diagnosis not present

## 2024-05-05 DIAGNOSIS — R202 Paresthesia of skin: Secondary | ICD-10-CM

## 2024-05-05 DIAGNOSIS — M25552 Pain in left hip: Secondary | ICD-10-CM

## 2024-05-05 DIAGNOSIS — I10 Essential (primary) hypertension: Secondary | ICD-10-CM

## 2024-05-05 DIAGNOSIS — E559 Vitamin D deficiency, unspecified: Secondary | ICD-10-CM

## 2024-05-05 DIAGNOSIS — R7689 Other specified abnormal immunological findings in serum: Secondary | ICD-10-CM

## 2024-05-05 DIAGNOSIS — M8589 Other specified disorders of bone density and structure, multiple sites: Secondary | ICD-10-CM

## 2024-05-05 DIAGNOSIS — M79642 Pain in left hand: Secondary | ICD-10-CM

## 2024-05-05 DIAGNOSIS — F39 Unspecified mood [affective] disorder: Secondary | ICD-10-CM

## 2024-05-05 NOTE — Patient Instructions (Signed)
 Pinched Nerve in the Wrist (Carpal Tunnel Syndrome): What to Know  Pinched nerve in the wrist (carpal tunnel syndrome, or CTS) is a nerve problem that causes pain, numbness, and weakness in the wrist, hand, and fingers. The carpal tunnel is a narrow space that is on the palm side of your wrist. Repeated wrist motions or certain diseases may cause swelling in the tunnel. This swelling can pinch the main nerve in the wrist (the median nerve). What are the causes? CTS may be caused by: Moving your hand and wrist over and over again while doing a task. Hurting the wrist. Arthritis. A pocket of fluid (cyst) or a growth (tumor) in the carpal tunnel. Fluid buildup when you are pregnant. Use of tools that vibrate. In some cases, the cause of CTS is not known. What increases the risk? You're more likely to have CTS if: You have a job that makes you do these things: Move your hand firmly over and over again. Work with tools that vibrate, such as drills or sanders. You're female. You have diabetes, obesity, thyroid problems, or kidney failure. What are the signs or symptoms? Symptoms of this condition include: A tingling feeling in your fingers. You may feel this pain in the thumb, index finger, or middle finger. Tingling or loss of feeling in your hand. Pain in your entire arm. This pain may get worse when you bend your wrist and elbow for a long time. Pain in your wrist that goes up your arm to your shoulder. Pain that goes down into your palm or fingers. Weakness in your hands. You may find it hard to grab and hold items. Your symptoms may feel worse during the night. How is this diagnosed? CTS is diagnosed with a medical history and physical exam. Tests and imaging may also be done to: Check the electrical signals sent by your nerves into the muscles. Check how well electrical signals pass through your nerves. Check possible causes of your CTS. These include X-rays, ultrasound, and  MRI. How is this treated? CTS may be treated with: Lifestyle changes. You will be asked to stop or change the activity that caused your problem. Physical therapy. This may include: Exercises that stretch and strengthen the muscles and tendons in the wrist and hand. Nerve gliding or flossing exercises. These help keep nerves moving smoothly through the tissues around them. Occupational therapy. You'll learn how to use your hand again. Medicines for pain and swelling. You may have injections in your wrist. A wrist splint or brace. Surgery. Follow these instructions at home: If you have a splint or brace: Wear the splint or brace as told. Take it off only if your provider says you can. Check the skin around it every day. Tell your provider if you see problems. Loosen the splint or brace if your fingers tingle, are numb, or turn cold and blue. Keep the splint or brace clean and dry. If the splint or brace isn't waterproof: Do not let it get wet. Cover it when you take a bath or shower. Use a cover that doesn't let any water in. Managing pain, stiffness, and swelling  Use ice or an ice pack as told. If you have a splint or brace that you can take off, remove it only as told. Place a towel between your skin and the ice. Leave the ice on for 20 minutes, 2-3 times a day. If your skin turns red, take off the ice right away to prevent skin damage. The risk  of damage is higher if you can't feel pain, heat, or cold. Move your fingers often to reduce stiffness and swelling. General instructions Take your medicines only as told. Rest your wrist and hand from activity that may cause pain. If your CTS is caused by things you do at work, talk with your employer about making changes. For example, you may need a wrist pad to use while typing. Exercise as told. Follow instructions on how to do nerve gliding or flossing exercises. These help keep nerves in moving smoothly through the tissues around  them. Keep all follow-up visits. This is important. Where to find more information American Academy of Orthopedic Surgeons: orthoinfo.aaos.[employer] of Neurological Disorders and Stroke: BasicFM.no Contact a health care provider if: You have new symptoms. Your pain is not controlled with medicines. Your symptoms get worse. Get help right away if: Your hand or wrist tingles or is numb, and the symptoms become very bad. This information is not intended to replace advice given to you by your health care provider. Make sure you discuss any questions you have with your health care provider. Document Revised: 01/29/2023 Document Reviewed: 11/16/2022 Elsevier Patient Education  2024 [employer].

## 2024-05-06 ENCOUNTER — Ambulatory Visit: Payer: Self-pay | Admitting: Internal Medicine

## 2024-05-06 ENCOUNTER — Encounter: Payer: Self-pay | Admitting: Internal Medicine

## 2024-05-06 VITALS — BP 118/80 | HR 80 | Temp 98.3°F | Ht 61.0 in | Wt 160.6 lb

## 2024-05-06 DIAGNOSIS — I129 Hypertensive chronic kidney disease with stage 1 through stage 4 chronic kidney disease, or unspecified chronic kidney disease: Secondary | ICD-10-CM

## 2024-05-06 DIAGNOSIS — R7303 Prediabetes: Secondary | ICD-10-CM

## 2024-05-06 DIAGNOSIS — E6609 Other obesity due to excess calories: Secondary | ICD-10-CM

## 2024-05-06 DIAGNOSIS — M79609 Pain in unspecified limb: Secondary | ICD-10-CM

## 2024-05-06 DIAGNOSIS — N182 Chronic kidney disease, stage 2 (mild): Secondary | ICD-10-CM

## 2024-05-06 NOTE — Progress Notes (Unsigned)
 I,Sara Obrien, CMA,acting as a neurosurgeon for Sara LOISE Slocumb, MD.,have documented all relevant documentation on the behalf of Sara LOISE Slocumb, MD,as directed by  Sara LOISE Slocumb, MD while in the presence of Sara LOISE Slocumb, MD.  Subjective:  Patient ID: Sara Obrien , female    DOB: November 25, 1945 , 79 y.o.   MRN: [id number]  Chief Complaint  Patient presents with   Medical Advice     Patient presents today wanting to discuss what she should be doing to prevent her now prediabetes stage. She does not want to work her way into type 2 diabetes. When she made the appt she did have a sore throat she admits this issue is better. She also has experienced some stress/anxiety. Which is coming from personal life & noticeable weight gain.    HPI Discussed the use of AI scribe software for clinical note transcription with the patient, who gave verbal consent to proceed.  History of Present Illness Sara Obrien is a 79 year old female who presents for a follow-up regarding weight management and prediabetes.  She has a history of prediabetes and is focused on weight management to prevent progression to diabetes. Her goal weight is 130 pounds, and she is currently at 145 pounds. She has not engaged in physical activities like pickleball since October due to a stressful experience at a state finals event.  She has been referred, by Rheumatology,  for a nerve conduction study to evaluate hand symptoms. She understands the procedure might be uncomfortable but necessary for further evaluation by a hand specialist.  Her family history is negative for thyroid  cancer and pancreatitis, which are relevant to her consideration of weight management medications.  Socially, she mentions a stressful family dynamic involving her husband's sister during a visit to her husband's hometown, impacting her willingness to visit again. She lives with her son and his dog, which occasionally causes stress when the dog  escapes. 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  She expresses some anxiety about blood draws, noting that she has not had enough water and is apprehensive about needles. No current sore throat.   Past Medical History:  Diagnosis Date   Anxiety    Atypical chest pain 07/01/2017   Constipation    Depression    Essential hypertension 07/01/2017   Gum disease    dentures worn   History of swelling of feet    Hyperlipidemia    Hypertension    Knee pain    Low back pain    Migraines    Overweight      Family History  Problem Relation Age of Onset   Brain cancer Mother    Hypertension Mother    Diabetes Mother    High Cholesterol Mother    Heart disease Mother    Stroke Mother    Depression Mother    Stroke Father    Hypertension Father    Heart disease Father    Sudden death Father    Hypertension Sister    Breast cancer Sister    Cancer Brother    Cancer Brother    Cancer Brother      Current Outpatient Medications:    aspirin EC 81 MG tablet, Take 81 mg by mouth daily. Swallow whole., Disp: , Rfl:    atorvastatin  (LIPITOR) 20 MG tablet, TAKE 1 TABLET EVERY DAY, Disp: 90 tablet, Rfl: 3   diphenhydramine-acetaminophen  (TYLENOL  PM) 25-500 MG  TABS tablet, Take 1 tablet by mouth at bedtime as needed., Disp: , Rfl:    escitalopram  (LEXAPRO ) 20 MG tablet, TAKE 1 TABLET EVERY DAY, Disp: 90 tablet, Rfl: 3   levocetirizine (XYZAL ) 5 MG tablet, Take 1 tablet (5 mg total) by mouth every evening., Disp: 90 tablet, Rfl: 1   Menthol, Topical Analgesic, (ICY HOT) 7.5 % (Roll) MISC, Apply topically as needed., Disp: , Rfl:    olmesartan -hydrochlorothiazide (BENICAR  HCT) 20-12.5 MG tablet, TAKE 1 TABLET EVERY DAY, Disp: 90 tablet, Rfl: 3   Omega-3 Fatty Acids (FISH OIL ) 1200 MG CAPS, Take 3 tabs daily, Disp: , Rfl:    OVER THE COUNTER MEDICATION, Koi CBD roll on, Disp: , Rfl:    No Known Allergies   Review of Systems  Eyes:  Negative.   Respiratory: Negative.    Cardiovascular: Negative.   Gastrointestinal: Negative.   Neurological: Negative.   Psychiatric/Behavioral: Negative.       Today's Vitals   05/06/24 1431  BP: 118/80  Pulse: 80  Temp: 98.3 F (36.8 C)  SpO2: 98%  Weight: 160 lb 9.6 oz (72.8 kg)  Height: 5' 1 (1.549 m)   Body mass index is 30.35 kg/m.  Wt Readings from Last 3 Encounters:  05/06/24 160 lb 9.6 oz (72.8 kg)  05/05/24 160 lb (72.6 kg)  01/30/24 164 lb 3.2 oz (74.5 kg)    The 10-year ASCVD risk score (Arnett DK, et al., 2019) is: 16.5%   Values used to calculate the score:     Age: 10 years     Clinically relevant sex: Female     Is Non-Hispanic African American: Yes     Diabetic: No     Tobacco smoker: No     Systolic Blood Pressure: 118 mmHg     Is BP treated: Yes     HDL Cholesterol: 64 mg/dL     Total Cholesterol: 189 mg/dL  Objective:  Physical Exam Vitals and nursing note reviewed.  Constitutional:      Appearance: Normal appearance.  HENT:     Head: Normocephalic and atraumatic.  Eyes:     Extraocular Movements: Extraocular movements intact.  Cardiovascular:     Rate and Rhythm: Normal rate and regular rhythm.     Heart sounds: Normal heart sounds.  Pulmonary:     Effort: Pulmonary effort is normal.     Breath sounds: Normal breath sounds.  Abdominal:     Tenderness: There is no right CVA tenderness.  Musculoskeletal:     Cervical back: Normal range of motion.  Skin:    General: Skin is warm.  Neurological:     General: No focal deficit present.     Mental Status: She is alert.  Psychiatric:        Mood and Affect: Mood normal.        Behavior: Behavior normal.         Assessment And Plan:   Assessment & Plan Prediabetes Previous labs reviewed, her A1c has been elevated in the past. I will check an A1c today. Reminded to avoid refined sugars including sugary drinks/foods and processed meats including bacon, sausages and deli meats.    Class 1 obesity due to excess calories with serious comorbidity and body mass index (BMI) of 30.0 to 30.9 in adult Considering oral Wegovy for weight loss. Family history of thyroid  cancer noted as a contraindication. Discussed potential side effects and administration instructions. Emphasized cost considerations and starting with the lowest dose. - Will provide  samples of oral Wegovy starting at 1.5 mg once available. - Instructed to take [facility] with 4 ounces of water upon waking and avoid eating or drinking for an hour after administration. - Monitor for side effects such as nausea, vomiting, constipation, and diarrhea.  Advised to stop eating when full.  - Documented goal weight as 130 lbs. Hypertensive nephropathy Chronic, controlled.  She will continue with olmesartan /hct. - Encouraged dietary sodium reduction. - Aim for at least 150 minutes of exercise per week.  Chronic renal disease, stage II Chronic, she is encouraged to keep BP well controlled and to stay well hydrated to decrease risk of CKD progression.   Paresthesia and pain of extremity Referred for nerve conduction study to determine cause and need for further intervention. - Proceed with nerve conduction study as scheduled. General Health Maintenance Discussed importance of hydration and addressed concerns about needles before blood draw. - Ensure adequate hydration before blood draw. - Proceed with scheduled blood work.  Orders Placed This Encounter  Procedures   CMP14+EGFR   Hemoglobin A1c   Return if symptoms worsen or fail to improve.  Patient was given opportunity to ask questions. Patient verbalized understanding of the plan and was able to repeat key elements of the plan. All questions were answered to their satisfaction.    I, Sara LOISE Slocumb, MD, have reviewed all documentation for this visit. The documentation on 05/08/24 for the exam, diagnosis, procedures, and orders are all accurate and complete.   IF  YOU HAVE BEEN REFERRED TO A SPECIALIST, IT MAY TAKE 1-2 WEEKS TO SCHEDULE/PROCESS THE REFERRAL. IF YOU HAVE NOT HEARD FROM US /SPECIALIST IN TWO WEEKS, PLEASE GIVE US  A CALL AT [phone].

## 2024-05-06 NOTE — Patient Instructions (Addendum)
 Exercising to Stay Healthy To become healthy and stay healthy, it is recommended that you do moderate-intensity and vigorous-intensity exercise. You can tell that you are exercising at a moderate intensity if your heart starts beating faster and you start breathing faster but can still hold a conversation. You can tell that you are exercising at a vigorous intensity if you are breathing much harder and faster and cannot hold a conversation while exercising. How can exercise benefit me? Exercising regularly is important. It has many health benefits, such as: Improving overall fitness, flexibility, and endurance. Increasing bone density. Helping with weight control. Decreasing body fat. Increasing muscle strength and endurance. Reducing stress and tension, anxiety, depression, or anger. Improving overall health. What guidelines should I follow while exercising? Before you start a new exercise program, talk with your health care provider. Do not exercise so much that you hurt yourself, feel dizzy, or get very short of breath. Wear comfortable clothes and wear shoes with good support. Drink plenty of water while you exercise to prevent dehydration or heat stroke. Work out until your breathing and your heartbeat get faster (moderate intensity). How often should I exercise? Choose an activity that you enjoy, and set realistic goals. Your health care provider can help you make an activity plan that is individually designed and works best for you. Exercise regularly as told by your health care provider. This may include: Doing strength training two times a week, such as: Lifting weights. Using resistance bands. Push-ups. Sit-ups. Yoga. Doing a certain intensity of exercise for a given amount of time. Choose from these options: A total of 150 minutes of moderate-intensity exercise every week. A total of 75 minutes of vigorous-intensity exercise every week. A mix of moderate-intensity and  vigorous-intensity exercise every week. Children, pregnant women, people who have not exercised regularly, people who are overweight, and older adults may need to talk with a health care provider about what activities are safe to perform. If you have a medical condition, be sure to talk with your health care provider before you start a new exercise program. What are some exercise ideas? Moderate-intensity exercise ideas include: Walking 1 mile (1.6 km) in about 15 minutes. Biking. Hiking. Golfing. Dancing. Water aerobics. Vigorous-intensity exercise ideas include: Walking 4.5 miles (7.2 km) or more in about 1 hour. Jogging or running 5 miles (8 km) in about 1 hour. Biking 10 miles (16.1 km) or more in about 1 hour. Lap swimming. Roller-skating or in-line skating. Cross-country skiing. Vigorous competitive sports, such as football, basketball, and soccer. Jumping rope. Aerobic dancing. What are some everyday activities that can help me get exercise? Yard work, such as: Child psychotherapist. Raking and bagging leaves. Washing your car. Pushing a stroller. Shoveling snow. Gardening. Washing windows or floors. How can I be more active in my day-to-day activities? Use stairs instead of an elevator. Take a walk during your lunch break. If you drive, park your car farther away from your work or school. If you take public transportation, get off one stop early and walk the rest of the way. Stand up or walk around during all of your indoor phone calls. Get up, stretch, and walk around every 30 minutes throughout the day. Enjoy exercise with a friend. Support to continue exercising will help you keep a regular routine of activity. Where to find more information You can find more information about exercising to stay healthy from: U.S. Department of Health and Human Services: ThisPath.fi Centers for Disease Control and Prevention (  CDC): FootballExhibition.com.br Summary Exercising regularly is  important. It will improve your overall fitness, flexibility, and endurance. Regular exercise will also improve your overall health. It can help you control your weight, reduce stress, and improve your bone density. Do not exercise so much that you hurt yourself, feel dizzy, or get very short of breath. Before you start a new exercise program, talk with your health care provider. This information is not intended to replace advice given to you by your health care provider. Make sure you discuss any questions you have with your health care provider. Document Revised: 07/15/2020 Document Reviewed: 07/15/2020 Elsevier Patient Education  2024 [employer].

## 2024-05-07 ENCOUNTER — Ambulatory Visit: Payer: Self-pay | Admitting: Internal Medicine

## 2024-05-07 LAB — CMP14+EGFR
ALT: 19 [IU]/L (ref 0–32)
AST: 25 [IU]/L (ref 0–40)
Albumin: 4.6 g/dL (ref 3.8–4.8)
Alkaline Phosphatase: 79 [IU]/L (ref 49–135)
BUN/Creatinine Ratio: 17 (ref 12–28)
BUN: 15 mg/dL (ref 8–27)
Bilirubin Total: 0.3 mg/dL (ref 0.0–1.2)
CO2: 23 mmol/L (ref 20–29)
Calcium: 10.9 mg/dL — ABNORMAL HIGH (ref 8.7–10.3)
Chloride: 102 mmol/L (ref 96–106)
Creatinine, Ser: 0.9 mg/dL (ref 0.57–1.00)
Globulin, Total: 3.4 g/dL (ref 1.5–4.5)
Glucose: 93 mg/dL (ref 70–99)
Potassium: 4.5 mmol/L (ref 3.5–5.2)
Sodium: 145 mmol/L — ABNORMAL HIGH (ref 134–144)
Total Protein: 8 g/dL (ref 6.0–8.5)
eGFR: 65 mL/min/{1.73_m2}

## 2024-05-07 LAB — HEMOGLOBIN A1C
Est. average glucose Bld gHb Est-mCnc: 120 mg/dL
Hgb A1c MFr Bld: 5.8 % — ABNORMAL HIGH (ref 4.8–5.6)

## 2024-05-08 NOTE — Assessment & Plan Note (Signed)
 Chronic, she is encouraged to keep BP well controlled and to stay well hydrated to decrease risk of CKD progression.

## 2024-05-08 NOTE — Assessment & Plan Note (Signed)
 Chronic, controlled.  She will continue with olmesartan /hct. - Encouraged dietary sodium reduction. - Aim for at least 150 minutes of exercise per week.

## 2024-05-08 NOTE — Assessment & Plan Note (Signed)
 Considering oral Wegovy for weight loss. Family history of thyroid  cancer noted as a contraindication. Discussed potential side effects and administration instructions. Emphasized cost considerations and starting with the lowest dose. - Will provide samples of oral Wegovy starting at 1.5 mg once available. - Instructed to take [facility] with 4 ounces of water upon waking and avoid eating or drinking for an hour after administration. - Monitor for side effects such as nausea, vomiting, constipation, and diarrhea.  Advised to stop eating when full.  - Documented goal weight as 130 lbs.

## 2024-05-08 NOTE — Assessment & Plan Note (Signed)
 Previous labs reviewed, her A1c has been elevated in the past. I will check an A1c today. Reminded to avoid refined sugars including sugary drinks/foods and processed meats including bacon, sausages and deli meats.

## 2024-05-08 NOTE — Assessment & Plan Note (Signed)
 Referred for nerve conduction study to determine cause and need for further intervention. - Proceed with nerve conduction study as scheduled.

## 2024-05-27 ENCOUNTER — Encounter: Admitting: Physical Medicine and Rehabilitation

## 2024-06-02 ENCOUNTER — Ambulatory Visit: Admitting: Diagnostic Neuroimaging

## 2024-06-30 ENCOUNTER — Ambulatory Visit: Admitting: Diagnostic Neuroimaging

## 2024-07-29 ENCOUNTER — Ambulatory Visit: Payer: Self-pay | Admitting: Internal Medicine

## 2025-02-08 ENCOUNTER — Encounter: Payer: Self-pay | Admitting: Internal Medicine

## 2025-05-05 ENCOUNTER — Ambulatory Visit: Admitting: Rheumatology
